# Patient Record
Sex: Male | Born: 1935 | Race: White | Hispanic: No | Marital: Married | Smoking: Never smoker
Health system: Southern US, Community
[De-identification: ages and names within clinical notes are randomized; demographics above are authoritative.]

## PROBLEM LIST (undated history)

## (undated) DIAGNOSIS — Z5189 Encounter for other specified aftercare: Secondary | ICD-10-CM

## (undated) DIAGNOSIS — C649 Malignant neoplasm of unspecified kidney, except renal pelvis: Secondary | ICD-10-CM

## (undated) DIAGNOSIS — N183 Chronic kidney disease, stage 3 unspecified: Secondary | ICD-10-CM

## (undated) DIAGNOSIS — T7840XA Allergy, unspecified, initial encounter: Secondary | ICD-10-CM

## (undated) DIAGNOSIS — J302 Other seasonal allergic rhinitis: Secondary | ICD-10-CM

## (undated) DIAGNOSIS — R000 Tachycardia, unspecified: Secondary | ICD-10-CM

## (undated) DIAGNOSIS — IMO0002 Reserved for concepts with insufficient information to code with codable children: Secondary | ICD-10-CM

## (undated) DIAGNOSIS — J189 Pneumonia, unspecified organism: Secondary | ICD-10-CM

## (undated) DIAGNOSIS — K219 Gastro-esophageal reflux disease without esophagitis: Secondary | ICD-10-CM

## (undated) DIAGNOSIS — C61 Malignant neoplasm of prostate: Secondary | ICD-10-CM

## (undated) DIAGNOSIS — M419 Scoliosis, unspecified: Secondary | ICD-10-CM

## (undated) DIAGNOSIS — M4120 Other idiopathic scoliosis, site unspecified: Secondary | ICD-10-CM

## (undated) DIAGNOSIS — E785 Hyperlipidemia, unspecified: Secondary | ICD-10-CM

## (undated) DIAGNOSIS — G510 Bell's palsy: Secondary | ICD-10-CM

## (undated) DIAGNOSIS — L570 Actinic keratosis: Secondary | ICD-10-CM

## (undated) DIAGNOSIS — Z85528 Personal history of other malignant neoplasm of kidney: Secondary | ICD-10-CM

## (undated) DIAGNOSIS — N529 Male erectile dysfunction, unspecified: Secondary | ICD-10-CM

## (undated) DIAGNOSIS — G4730 Sleep apnea, unspecified: Secondary | ICD-10-CM

## (undated) DIAGNOSIS — C801 Malignant (primary) neoplasm, unspecified: Secondary | ICD-10-CM

## (undated) DIAGNOSIS — Q600 Renal agenesis, unilateral: Secondary | ICD-10-CM

## (undated) DIAGNOSIS — H269 Unspecified cataract: Secondary | ICD-10-CM

## (undated) HISTORY — DX: Sleep apnea, unspecified: G47.30

## (undated) HISTORY — DX: Allergy, unspecified, initial encounter: T78.40XA

## (undated) HISTORY — DX: Actinic keratosis: L57.0

## (undated) HISTORY — DX: Pneumonia, unspecified organism: J18.9

## (undated) HISTORY — DX: Bell's palsy: G51.0

## (undated) HISTORY — PX: CATARACT EXTRACTION W/ INTRAOCULAR LENS IMPLANT: SHX1309

## (undated) HISTORY — DX: Hyperlipidemia, unspecified: E78.5

## (undated) HISTORY — DX: Encounter for other specified aftercare: Z51.89

## (undated) HISTORY — DX: Scoliosis, unspecified: M41.9

## (undated) HISTORY — DX: Unspecified cataract: H26.9

## (undated) HISTORY — DX: Malignant (primary) neoplasm, unspecified: C80.1

## (undated) HISTORY — DX: Malignant neoplasm of unspecified kidney, except renal pelvis: C64.9

---
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
## 1985-10-01 HISTORY — PX: NEPHRECTOMY: SHX65

## 1991-12-02 HISTORY — PX: LAMINECTOMY: SHX219

## 1998-01-01 ENCOUNTER — Encounter: Payer: Self-pay | Admitting: Family Medicine

## 1998-01-01 LAB — CONVERTED CEMR LAB: PSA: 2.5 ng/mL

## 1998-07-01 DIAGNOSIS — G4730 Sleep apnea, unspecified: Secondary | ICD-10-CM

## 1998-07-01 HISTORY — DX: Sleep apnea, unspecified: G47.30

## 1998-07-02 ENCOUNTER — Ambulatory Visit: Admission: RE | Admit: 1998-07-02 | Discharge: 1998-07-02 | Payer: Self-pay | Admitting: Internal Medicine

## 1998-12-01 HISTORY — PX: SHOULDER OPEN ROTATOR CUFF REPAIR: SHX2407

## 1999-06-01 ENCOUNTER — Encounter: Payer: Self-pay | Admitting: Family Medicine

## 1999-06-01 LAB — CONVERTED CEMR LAB: PSA: 3 ng/mL

## 2001-08-04 ENCOUNTER — Encounter: Payer: Self-pay | Admitting: Specialist

## 2001-08-04 ENCOUNTER — Ambulatory Visit (HOSPITAL_COMMUNITY): Admission: RE | Admit: 2001-08-04 | Discharge: 2001-08-04 | Payer: Self-pay | Admitting: Specialist

## 2001-08-25 ENCOUNTER — Encounter: Payer: Self-pay | Admitting: Specialist

## 2001-08-31 HISTORY — PX: SHOULDER OPEN ROTATOR CUFF REPAIR: SHX2407

## 2001-09-03 ENCOUNTER — Inpatient Hospital Stay (HOSPITAL_COMMUNITY): Admission: RE | Admit: 2001-09-03 | Discharge: 2001-09-05 | Payer: Self-pay | Admitting: Specialist

## 2002-03-01 ENCOUNTER — Encounter: Payer: Self-pay | Admitting: Family Medicine

## 2002-03-01 LAB — CONVERTED CEMR LAB: PSA: 1.7 ng/mL

## 2003-01-01 HISTORY — PX: FRACTURE SURGERY: SHX138

## 2004-07-01 ENCOUNTER — Encounter: Payer: Self-pay | Admitting: Family Medicine

## 2004-07-01 LAB — CONVERTED CEMR LAB: PSA: 1.5 ng/mL

## 2004-10-18 ENCOUNTER — Ambulatory Visit: Payer: Self-pay | Admitting: Family Medicine

## 2004-11-22 ENCOUNTER — Ambulatory Visit: Payer: Self-pay | Admitting: Family Medicine

## 2005-01-03 ENCOUNTER — Ambulatory Visit: Payer: Self-pay | Admitting: Family Medicine

## 2005-01-10 ENCOUNTER — Ambulatory Visit: Payer: Self-pay | Admitting: Family Medicine

## 2005-08-01 ENCOUNTER — Ambulatory Visit: Payer: Self-pay | Admitting: Family Medicine

## 2005-08-01 LAB — CONVERTED CEMR LAB: PSA: 1.87 ng/mL

## 2005-08-08 ENCOUNTER — Ambulatory Visit: Payer: Self-pay | Admitting: Family Medicine

## 2005-08-08 ENCOUNTER — Ambulatory Visit (HOSPITAL_COMMUNITY): Admission: RE | Admit: 2005-08-08 | Discharge: 2005-08-08 | Payer: Self-pay | Admitting: Family Medicine

## 2005-09-08 ENCOUNTER — Ambulatory Visit: Payer: Self-pay | Admitting: Family Medicine

## 2006-04-13 ENCOUNTER — Emergency Department: Payer: Self-pay | Admitting: Internal Medicine

## 2006-11-13 ENCOUNTER — Ambulatory Visit: Payer: Self-pay | Admitting: Family Medicine

## 2007-01-22 ENCOUNTER — Ambulatory Visit: Payer: Self-pay | Admitting: Family Medicine

## 2007-01-22 LAB — CONVERTED CEMR LAB
ALT: 22 units/L (ref 0–40)
AST: 23 units/L (ref 0–37)
Albumin: 4.4 g/dL (ref 3.5–5.2)
Alkaline Phosphatase: 53 units/L (ref 39–117)
BUN: 11 mg/dL (ref 6–23)
Basophils Absolute: 0 10*3/uL (ref 0.0–0.1)
Basophils Relative: 0.3 % (ref 0.0–1.0)
Bilirubin, Direct: 0.1 mg/dL (ref 0.0–0.3)
CO2: 32 meq/L (ref 19–32)
Calcium: 9.9 mg/dL (ref 8.4–10.5)
Chloride: 106 meq/L (ref 96–112)
Cholesterol: 187 mg/dL (ref 0–200)
Creatinine, Ser: 1.3 mg/dL (ref 0.4–1.5)
Eosinophils Absolute: 0.1 10*3/uL (ref 0.0–0.6)
Eosinophils Relative: 3 % (ref 0.0–5.0)
GFR calc Af Amer: 71 mL/min
GFR calc non Af Amer: 58 mL/min
Glucose, Bld: 103 mg/dL — ABNORMAL HIGH (ref 70–99)
HCT: 41.1 % (ref 39.0–52.0)
HDL: 48.8 mg/dL (ref >39.0)
Hemoglobin: 14.3 g/dL (ref 13.0–17.0)
LDL Cholesterol: 126 mg/dL — ABNORMAL HIGH (ref 0–99)
Lymphocytes Relative: 37.9 % (ref 12.0–46.0)
MCHC: 34.8 g/dL (ref 30.0–36.0)
MCV: 95 fL (ref 78.0–100.0)
Monocytes Absolute: 0.7 10*3/uL (ref 0.2–0.7)
Monocytes Relative: 16.1 % — ABNORMAL HIGH (ref 3.0–11.0)
Neutro Abs: 1.9 10*3/uL (ref 1.4–7.7)
Neutrophils Relative %: 42.7 % — ABNORMAL LOW (ref 43.0–77.0)
PSA: 1.61 ng/mL
PSA: 1.61 ng/mL (ref 0.10–4.00)
Platelets: 260 10*3/uL (ref 150–400)
Potassium: 4.1 meq/L (ref 3.5–5.1)
RBC: 4.32 M/uL (ref 4.22–5.81)
RDW: 12.8 % (ref 11.5–14.6)
Sodium: 143 meq/L (ref 135–145)
TSH: 1.82 microintl units/mL (ref 0.35–5.50)
Total Bilirubin: 1 mg/dL (ref 0.3–1.2)
Total CHOL/HDL Ratio: 3.8
Total Protein: 6.9 g/dL (ref 6.0–8.3)
Triglycerides: 62 mg/dL (ref 0–149)
VLDL: 12 mg/dL (ref 0–40)
WBC: 4.4 10*3/uL — ABNORMAL LOW (ref 4.5–10.5)

## 2007-01-29 ENCOUNTER — Ambulatory Visit: Payer: Self-pay | Admitting: Family Medicine

## 2007-02-03 ENCOUNTER — Ambulatory Visit: Payer: Self-pay | Admitting: Family Medicine

## 2007-07-22 ENCOUNTER — Encounter: Payer: Self-pay | Admitting: Family Medicine

## 2007-07-22 DIAGNOSIS — C642 Malignant neoplasm of left kidney, except renal pelvis: Secondary | ICD-10-CM

## 2007-08-06 ENCOUNTER — Ambulatory Visit: Payer: Self-pay | Admitting: Family Medicine

## 2007-09-10 ENCOUNTER — Ambulatory Visit: Payer: Self-pay | Admitting: Family Medicine

## 2007-10-14 ENCOUNTER — Ambulatory Visit: Payer: Self-pay | Admitting: Family Medicine

## 2008-02-04 ENCOUNTER — Ambulatory Visit: Payer: Self-pay | Admitting: Family Medicine

## 2008-02-04 LAB — CONVERTED CEMR LAB
AST: 23 units/L (ref 0–37)
Albumin: 4.4 g/dL (ref 3.5–5.2)
Alkaline Phosphatase: 57 units/L (ref 39–117)
Basophils Absolute: 0 10*3/uL (ref 0.0–0.1)
Basophils Relative: 0 % (ref 0.0–1.0)
CO2: 33 meq/L — ABNORMAL HIGH (ref 19–32)
Chloride: 106 meq/L (ref 96–112)
Cholesterol: 181 mg/dL (ref 0–200)
Creatinine, Ser: 1.3 mg/dL (ref 0.4–1.5)
HCT: 42.1 % (ref 39.0–52.0)
Hemoglobin: 14 g/dL (ref 13.0–17.0)
LDL Cholesterol: 124 mg/dL — ABNORMAL HIGH (ref 0–99)
MCHC: 33.2 g/dL (ref 30.0–36.0)
Neutrophils Relative %: 45.9 % (ref 43.0–77.0)
PSA: 1.68 ng/mL (ref 0.10–4.00)
RBC: 4.42 M/uL (ref 4.22–5.81)
RDW: 13.5 % (ref 11.5–14.6)
Sodium: 143 meq/L (ref 135–145)
TSH: 1.57 microintl units/mL (ref 0.35–5.50)
Total Bilirubin: 0.9 mg/dL (ref 0.3–1.2)
Total CHOL/HDL Ratio: 4
Total Protein: 7 g/dL (ref 6.0–8.3)
VLDL: 11 mg/dL (ref 0–40)
WBC: 4.2 10*3/uL — ABNORMAL LOW (ref 4.5–10.5)

## 2008-02-18 ENCOUNTER — Ambulatory Visit: Payer: Self-pay | Admitting: Family Medicine

## 2008-02-18 DIAGNOSIS — M79609 Pain in unspecified limb: Secondary | ICD-10-CM

## 2008-02-24 ENCOUNTER — Telehealth: Payer: Self-pay | Admitting: Family Medicine

## 2008-03-30 ENCOUNTER — Ambulatory Visit: Payer: Self-pay | Admitting: Family Medicine

## 2008-03-30 ENCOUNTER — Encounter (INDEPENDENT_AMBULATORY_CARE_PROVIDER_SITE_OTHER): Payer: Self-pay | Admitting: *Deleted

## 2008-03-30 LAB — CONVERTED CEMR LAB
OCCULT 2: NEGATIVE
OCCULT 3: NEGATIVE

## 2008-09-11 ENCOUNTER — Ambulatory Visit: Payer: Self-pay | Admitting: Family Medicine

## 2009-04-16 ENCOUNTER — Telehealth: Payer: Self-pay | Admitting: Family Medicine

## 2009-06-15 ENCOUNTER — Ambulatory Visit: Payer: Self-pay | Admitting: Family Medicine

## 2009-06-15 LAB — CONVERTED CEMR LAB
AST: 23 units/L (ref 0–37)
Albumin: 4.2 g/dL (ref 3.5–5.2)
Basophils Absolute: 0 10*3/uL (ref 0.0–0.1)
CO2: 28 meq/L (ref 19–32)
Chloride: 110 meq/L (ref 96–112)
Creatinine,U: 55.2 mg/dL
Eosinophils Absolute: 0.1 10*3/uL (ref 0.0–0.7)
Glucose, Bld: 101 mg/dL — ABNORMAL HIGH (ref 70–99)
HCT: 38.8 % — ABNORMAL LOW (ref 39.0–52.0)
Hemoglobin: 13.2 g/dL (ref 13.0–17.0)
Lymphs Abs: 1.5 10*3/uL (ref 0.7–4.0)
MCHC: 34.1 g/dL (ref 30.0–36.0)
MCV: 95.8 fL (ref 78.0–100.0)
Monocytes Relative: 14.5 % — ABNORMAL HIGH (ref 3.0–12.0)
Neutro Abs: 1.9 10*3/uL (ref 1.4–7.7)
Potassium: 3.8 meq/L (ref 3.5–5.1)
RDW: 13.3 % (ref 11.5–14.6)
Sodium: 141 meq/L (ref 135–145)
TSH: 1.54 microintl units/mL (ref 0.35–5.50)

## 2009-06-25 ENCOUNTER — Ambulatory Visit: Payer: Self-pay | Admitting: Family Medicine

## 2009-07-25 ENCOUNTER — Ambulatory Visit: Payer: Self-pay | Admitting: Family Medicine

## 2009-07-25 LAB — CONVERTED CEMR LAB: OCCULT 3: NEGATIVE

## 2009-08-20 ENCOUNTER — Ambulatory Visit: Payer: Self-pay | Admitting: Family Medicine

## 2009-08-20 LAB — CONVERTED CEMR LAB: OCCULT 3: NEGATIVE

## 2009-08-21 ENCOUNTER — Encounter (INDEPENDENT_AMBULATORY_CARE_PROVIDER_SITE_OTHER): Payer: Self-pay | Admitting: *Deleted

## 2010-01-31 ENCOUNTER — Telehealth: Payer: Self-pay | Admitting: Family Medicine

## 2010-05-16 ENCOUNTER — Ambulatory Visit: Payer: Self-pay | Admitting: Family Medicine

## 2010-05-16 LAB — CONVERTED CEMR LAB
ALT: 25 units/L (ref 0–53)
Albumin: 4.5 g/dL (ref 3.5–5.2)
Basophils Relative: 0.4 % (ref 0.0–3.0)
CO2: 32 meq/L (ref 19–32)
Calcium: 9.7 mg/dL (ref 8.4–10.5)
Chloride: 108 meq/L (ref 96–112)
Cholesterol: 175 mg/dL (ref 0–200)
Creatinine,U: 91.9 mg/dL
Eosinophils Absolute: 0.2 10*3/uL (ref 0.0–0.7)
HCT: 41.1 % (ref 39.0–52.0)
Hemoglobin: 14.2 g/dL (ref 13.0–17.0)
MCHC: 34.5 g/dL (ref 30.0–36.0)
MCV: 96.1 fL (ref 78.0–100.0)
Microalb Creat Ratio: 0.5 mg/g (ref 0.0–30.0)
Monocytes Absolute: 0.8 10*3/uL (ref 0.1–1.0)
Neutro Abs: 2.2 10*3/uL (ref 1.4–7.7)
Potassium: 4.7 meq/L (ref 3.5–5.1)
RBC: 4.28 M/uL (ref 4.22–5.81)
TSH: 2.07 microintl units/mL (ref 0.35–5.50)
Total Protein: 6.7 g/dL (ref 6.0–8.3)
VLDL: 18.8 mg/dL (ref 0.0–40.0)

## 2010-05-21 ENCOUNTER — Ambulatory Visit: Payer: Self-pay | Admitting: Family Medicine

## 2010-05-21 DIAGNOSIS — N529 Male erectile dysfunction, unspecified: Secondary | ICD-10-CM

## 2010-05-31 ENCOUNTER — Ambulatory Visit: Payer: Self-pay | Admitting: Family Medicine

## 2010-05-31 LAB — CONVERTED CEMR LAB: OCCULT 3: NEGATIVE

## 2010-06-04 ENCOUNTER — Encounter: Payer: Self-pay | Admitting: Family Medicine

## 2010-07-09 ENCOUNTER — Encounter (INDEPENDENT_AMBULATORY_CARE_PROVIDER_SITE_OTHER): Payer: Self-pay | Admitting: *Deleted

## 2010-08-20 ENCOUNTER — Ambulatory Visit: Payer: Self-pay | Admitting: Family Medicine

## 2010-12-29 LAB — CONVERTED CEMR LAB
ALT: 27 units/L (ref 0–53)
AST: 25 units/L (ref 0–37)
Alkaline Phosphatase: 54 units/L (ref 39–117)
Basophils Relative: 0.2 % (ref 0.0–1.0)
Bilirubin Urine: NEGATIVE
Bilirubin, Direct: 0.1 mg/dL (ref 0.0–0.3)
Blood in Urine, dipstick: NEGATIVE
Eosinophils Relative: 3.9 % (ref 0.0–5.0)
GFR calc Af Amer: 71 mL/min
GFR calc non Af Amer: 58 mL/min
Glucose, Bld: 106 mg/dL — ABNORMAL HIGH (ref 70–99)
Glucose, Urine, Semiquant: NEGATIVE
HCT: 42.4 % (ref 39.0–52.0)
MCV: 94.4 fL (ref 78.0–100.0)
Neutrophils Relative %: 47.2 % (ref 43.0–77.0)
Phosphorus: 3.6 mg/dL (ref 2.3–4.6)
Platelets: 277 10*3/uL (ref 150–400)
Potassium: 4.4 meq/L (ref 3.5–5.1)
Protein, U semiquant: NEGATIVE
RBC: 4.5 M/uL (ref 4.22–5.81)
Sodium: 140 meq/L (ref 135–145)
Specific Gravity, Urine: 1.02
Total Bilirubin: 1 mg/dL (ref 0.3–1.2)
Total Protein: 7.1 g/dL (ref 6.0–8.3)
WBC Urine, dipstick: NEGATIVE
WBC: 6.3 10*3/uL (ref 4.5–10.5)

## 2010-12-31 NOTE — Assessment & Plan Note (Signed)
Summary: FLU SHOT / LFW  Nurse Visit   Allergies: 1)  * Radiopaque Dye  Immunizations Administered:  Influenza Vaccine # 1:    Vaccine Type: Fluvax 3+    Site: left deltoid    Mfr: GlaxoSmithKline    Dose: 0.5 ml    Route: IM    Given by: Mervin Hack CMA (AAMA)    Exp. Date: 05/31/2011    Lot #: [id number]    VIS given: 06/25/10 version given August 20, 2010.  Flu Vaccine Consent Questions:    Do you have a history of severe allergic reactions to this vaccine? no    Any prior history of allergic reactions to egg and/or gelatin? no    Do you have a sensitivity to the preservative Thimersol? no    Do you have a past history of Guillan-Barre Syndrome? no    Do you currently have an acute febrile illness? no    Have you ever had a severe reaction to latex? no    Vaccine information given and explained to patient? yes  Orders Added: 1)  Flu Vaccine 60yrs + [90658] 2)  Admin 1st Vaccine [[address]]

## 2010-12-31 NOTE — Progress Notes (Signed)
Summary: needs written script for mail order  Phone Note Refill Request Call back at Home Phone [phone] Message from:  Patient  Refills Requested: Medication #1:  CRESTOR 10 MG TABS Take 1/2 by mouth at bedtime Pt needs 90 day written script for new mail order company.[facility].  Please call when ready.  Initial call taken by: Lowella Petties CMA,  January 31, 2010 12:17 PM  Follow-up for Phone Call        Left message that pt's script is ready. Follow-up by: Lowella Petties CMA,  January 31, 2010 2:33 PM    Prescriptions: CRESTOR 10 MG TABS (ROSUVASTATIN CALCIUM) Take 1/2 by mouth at bedtime  #45 x 3   Entered and Authorized by:   Shaune Leeks MD   Signed by:   Shaune Leeks MD on 01/31/2010   Method used:   Printed then faxed to ...       [facility]  [address] (retail)       [address]       [place], [place]  [address]       Ph: [phone]       Fax: [phone]   RxID:   [id number]

## 2010-12-31 NOTE — Assessment & Plan Note (Signed)
Summary: CPX/CLE   Vital Signs:  Patient profile:   75 year old male Weight:      134.50 pounds BMI:     22.46 Temp:     98.4 degrees F oral Pulse rate:   88 / minute Pulse rhythm:   regular BP sitting:   104 / 60  (left arm) Cuff size:   regular  Vitals Entered By: Sydell Axon LPN (06-20-10 1:56 PM) CC: 30 Minute checkup, hemoccult cards given to patient   History of Present Illness: Pt hee for Comp Exam. He is now retired and doing well. He has pain in the lateral side of the knee at the inserytion of the right hamstring.  He also has fasiculation and puffiness of the left eye. He had an eyew exam 4 may but wasn't doing that then.   Preventive Screening-Counseling & Management  Alcohol-Tobacco     Alcohol drinks/day: 0     Smoking Status: never     Passive Smoke Exposure: no  Caffeine-Diet-Exercise     Caffeine use/day: 2     Does Patient Exercise: yes     Type of exercise: calisthentics in the AM     Exercise (avg: min/session): 30-60     Times/week: 5  Problems Prior to Update: 1)  Special Screening Malig Neoplasms Other Sites  (ICD-V76.49) 2)  Health Maintenance Exam  (ICD-V70.0) 3)  Foot Pain, Right  (ICD-729.5) 4)  Special Screening Malignant Neoplasm of Prostate  (ICD-V76.44) 5)  Hyperglycemia (103)  (ICD-790.29) 6)  Hypercholesterolemia, 262/ldl 181  (ICD-272.0) 7)  Laminectomy, Hx of (DR. KRITZER)  (ICD-V45.89) 8)  Renal Cell Cancer, Left, (R UNILATERAL KIDNEY)  (ICD-189.0)  Medications Prior to Update: 1)  Crestor 10 Mg Tabs (Rosuvastatin Calcium) .... Take 1/2 By Mouth At Bedtime 2)  Tgt Aspirin 81 Mg Tbec (Aspirin) .... Take 1 Tablet By Mouth Every Morning 3)  Multivitamins   Tabs (Multiple Vitamin) .... Take One By Mouth Once A Day 4)  Benadryl 25 Mg  Tabs (Diphenhydramine Hcl) .... Take One By Mouth As Needed 5)  Vitamin E 6)  Calcium .... Take One By Mouth Once A Day 7)  Claritin 10 Mg  Tabs (Loratadine) .Marland Kitchen.. 1 Once Daily By Mouth As  Needed 8)  Omega-3 350 Mg Caps (Omega-3 Fatty Acids) .... Takes 1 Capsule Twice A Day By Mouth  Allergies: 1)  * Radiopaque Dye  Past History:  Past Surgical History: Last updated: 06/25/2009 Nephrectomy, Left 10/1985 Laminectomy L3-5 1993 Sleep apnea 08/99 Right shoulder rotator cuff repair 12/1998 Left shoulder rotator cuff repair 08/2001 Fractured L Wrist 01/2003  Family History: Last updated: Jun 20, 2010 Father: Died at age 19 with aortic valve repair and lung cancer secondary to prostate metastases and several mini strokes Mother: dec  65 Ca of breast with mets stroke (1980)    pacer  Sister A  70  Lupus/ fibromyalgia/arthritis and hepatitis C Sister A  95 Sister A 16 Sister A 37  Social History: Last updated: 20-Jun-2010 Marital Status: Married Children: 2 Daughters 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  Risk Factors: Alcohol Use: 0 (06/20/2010) Caffeine Use: 2 (2010/06/20) Exercise: yes (2010-06-20)  Risk Factors: Smoking Status: never (June 20, 2010) Passive Smoke Exposure: no (Jun 20, 2010)  Family History: Father: Died at age 67 with aortic valve repair and lung cancer secondary to prostate metastases and several mini strokes Mother: dec  6 Ca of breast with mets stroke (1980)    pacer  Sister A  70  Lupus/ fibromyalgia/arthritis and hepatitis C Sister A  92 Sister A 77 Sister A 90  Social History: Marital Status: Married Children: 2 Daughters 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 Eyes:  Denies blurring and eye pain; See HPI  fasiculation of lowe left eye. ENT:  Denies decreased hearing, earache, and ringing in ears. CV:  Denies chest pain or discomfort, fainting, fatigue, palpitations, shortness of  breath with exertion, swelling of feet, and swelling of hands. GI:  Denies abdominal pain, bloody stools, change in bowel habits, constipation, dark tarry stools, diarrhea, indigestion, loss of appetite, nausea, vomiting, vomiting blood, and yellowish skin color. GU:  Complains of erectile dysfunction; denies discharge, dysuria, nocturia, and urinary frequency; recent.. MS:  Complains of joint pain; denies muscle aches, cramps, and stiffness; left foot. Derm:  Denies dryness, itching, and rash. Neuro:  Denies numbness, poor balance, tingling, and tremors.  Physical Exam  General:  Well-developed,well-nourished,in no acute distress; alert,appropriate and cooperative throughout examination, thin but healthy. Head:  Normocephalic and atraumatic without obvious abnormalities. No apparent alopecia or balding. Sinuses NT. Eyes:  Conjunctiva clear bilaterally.  Ears:  External ear exam shows no significant lesions or deformities.  Otoscopic examination reveals clear canals, tympanic membranes are intact bilaterally without bulging, retraction, inflammation or discharge. Hearing is grossly normal bilaterally. Nose:  External nasal examination shows no deformity or inflammation. Nasal mucosa are pink and moist without lesions or exudates. Mouth:  Oral mucosa and oropharynx without lesions or exudates.  Teeth in good repair. Neck:  No deformities, masses, or tenderness noted. Chest Wall:  No deformities, masses, tenderness or gynecomastia noted. Breasts:  No masses or gynecomastia noted Lungs:  Normal respiratory effort, chest expands symmetrically. Lungs are clear to auscultation, no crackles or wheezes. Heart:  Normal rate and regular rhythm. S1 and S2 normal without gallop, murmur, click, rub or other extra sounds. Abdomen:  Bowel sounds positive,abdomen soft and non-tender without masses, organomegaly or hernias noted. Hernia felt ok today, had mild bulge year before last. Rectal:  No external  abnormalities noted. Normal sphincter tone. No rectal masses or tenderness. G neg. Genitalia:  Testes bilaterally descended without nodularity, tenderness or masses. No scrotal masses or lesions. No penis lesions or urethral discharge. Prostate:  Prostate gland firm and smooth, no enlargement, nodularity, tenderness, mass, asymmetry or induration. 10-20gms. Tight sphincter. Msk:  No deformity or scoliosis noted of thoracic or lumbar spine.   Pulses:  R and L carotid,radial,femoral,dorsalis pedis and posterior tibial pulses are full and equal bilaterally Extremities:  No clubbing, cyanosis, edema, or deformity noted with normal full range of motion of all joints.  Minimally tender over the right hamstring insertion laterall on the tibial plateau. Neurologic:  No cranial nerve deficits noted. Station and gait are normal.  Sensory, motor and coordinative functions appear intact. Skin:  Intact without suspicious lesions or rashes, small lipoma on posterior right upper thigh. Cervical Nodes:  No lymphadenopathy noted Inguinal Nodes:  No significant adenopathy Psych:  Cognition and judgment appear intact. Alert and cooperative with normal attention span and concentration. No apparent delusions, illusions, hallucinations   Impression & Recommendations:  Problem # 1:  HEALTH MAINTENANCE EXAM (ICD-V70.0)  Reviewed preventive care protocols, scheduled due services, and updated immunizations. Discussed Zostavax. Cont curr lifestyle.  Problem #  2:  FOOT PAIN, RIGHT (ICD-729.5) Assessment: Unchanged Mild but continues to improve, although still there.  Problem # 3:  SPECIAL SCREENING MALIGNANT NEOPLASM OF PROSTATE (ICD-V76.44) Assessment: Unchanged Stable exam and PSA.  Problem # 4:  HYPERGLYCEMIA (103) (ICD-790.29) Assessment: Improved Euglycemic this year. Microalbumin nml.  Problem # 5:  HYPERCHOLESTEROLEMIA, 262/LDL 181 (ICD-272.0) Assessment: Unchanged Good nos. Always avoid fatty food if  able. His updated medication list for this problem includes:    Crestor 10 Mg Tabs (Rosuvastatin calcium) .Marland Kitchen... Take 1/2 by mouth at bedtime  Labs Reviewed: SGOT: 23 (05/16/2010)   SGPT: 25 (05/16/2010)   HDL:39.70 (05/16/2010), 43.60 (06/15/2009)  LDL:117 (05/16/2010), 100 (06/15/2009)  Chol:175 (05/16/2010), 157 (06/15/2009)  Trig:94.0 (05/16/2010), 65.0 (06/15/2009)  Problem # 6:  ERECTILE DYSFUNCTION, ORGANIC (ICD-607.84) Assessment: New  Offered Viagra if interested. Not sure at the price I quoted him. His updated medication list for this problem includes:    Viagra 100 Mg Tabs (Sildenafil citrate) ..... One tab by mouth one hour prior  Discussed proper use of medications, as well as side effects.   Complete Medication List: 1)  Crestor 10 Mg Tabs (Rosuvastatin calcium) .... Take 1/2 by mouth at bedtime 2)  Tgt Aspirin 81 Mg Tbec (Aspirin) .... Take 1 tablet by mouth every morning 3)  Multivitamins Tabs (Multiple vitamin) .... Take one by mouth once a day 4)  Benadryl 25 Mg Tabs (Diphenhydramine hcl) .... Take one by mouth as needed 5)  Claritin 10 Mg Tabs (Loratadine) .Marland Kitchen.. 1 once daily by mouth as needed 6)  Omega-3 350 Mg Caps (Omega-3 fatty acids) .... Takes 1 capsule twice a day by mouth 7)  Vitamin E 400 Unit Caps (Vitamin e) .... Take one by mouth daily 8)  Calcium-vitamin D 500-125 Mg-unit Tabs (Calcium-vitamin d) .... Take one by mouth daily 9)  Vitamin D 1000 Unit Tabs (Cholecalciferol) .... Take one by mouth daily 10)  Viagra 100 Mg Tabs (Sildenafil citrate) .... One tab by mouth one hour prior Prescriptions: VIAGRA 100 MG TABS (SILDENAFIL CITRATE) one tab by mouth one hour prior  #10 x 12   Entered and Authorized by:   Shaune Leeks MD   Signed by:   Shaune Leeks MD on 05/21/2010   Method used:   Print then Give to Patient   RxID:   [id number]   Current Allergies (reviewed today): * RADIOPAQUE DYE

## 2010-12-31 NOTE — Letter (Signed)
Summary: Nadara Eaton letter  [place] at [facility]  [address] [place], [place] [address]   Phone: [phone]  Fax: [phone]       07/09/2010 MRN: [id number]  [facility] [address] [place], [place]  [address]  Dear Mr. MASATO, [facility] [place], and [facility] announce the retirement of Arta Silence, M.D., from full-time practice at the [facility] office effective May 30, 2010 and his plans of returning part-time.  It is important to Dr. Hetty Ely and to our practice that you understand that [facility] has seven physicians in our office for your health care needs.  We will continue to offer the same exceptional care that you have today.    Dr. Hetty Ely has spoken to many of you about his plans for retirement and returning part-time in the fall.   We will continue to work with you through the transition to schedule appointments for you in the office and meet the high standards that [facility] is committed to.   Again, it is with great pleasure that we share the news that Dr. Hetty Ely will return to [facility] at [facility] in October of 2011 with a reduced schedule.    If you have any questions, or would like to request an appointment with one of our physicians, please call us at [phone] and press the option for Scheduling an appointment.  We take pleasure in providing you with excellent patient care and look forward to seeing you at your next office visit.  Our [facility] Physicians are:  Tillman Abide, M.D. [name], M.D. Roxy Manns, M.D. Kerby Nora, M.D. Hannah Beat, M.D. Ruthe Mannan, M.D. We proudly welcomed Raechel Ache, M.D. and Eustaquio Boyden, M.D. to the practice in July/August 2011.  Sincerely,  [facility] [facility]

## 2010-12-31 NOTE — Letter (Signed)
Summary: Results Follow up Letter  [place] at [facility]  [address] [place], [place] [address]   Phone: [phone]  Fax: [phone]    06/04/2010 MRN: [id number]    SYRUS NAKAMA [address] [place], [place]  [address]    Dear Mr. DUPREE,  The following are the results of your recent test(s):  Test         Result    Pap Smear:        Normal _____  Not Normal _____ Comments: ______________________________________________________ Cholesterol: LDL(Bad cholesterol):         Your goal is less than:         HDL (Good cholesterol):       Your goal is more than: Comments:  ______________________________________________________ Mammogram:        Normal _____  Not Normal _____ Comments:  ___________________________________________________________________ Hemoccult:        Normal __X___  Not normal _______ Comments:    _____________________________________________________________________ Other Tests:    We routinely do not discuss normal results over the telephone.  If you desire a copy of the results, or you have any questions about this information we can discuss them at your next office visit.   Sincerely,    Idamae Schuller Mika Anastasi,MD  MT/ri

## 2011-04-18 NOTE — Op Note (Signed)
[facility]  Patient:    Kenneth Ross, Kenneth Ross Visit Number: [id number] MRN: [id number]          Service Type: SUR Location: 4W [id number] [id number] Attending Physician:  Pierce Crane Proc. Date: 09/03/01 Admit Date:  09/03/2001                             Operative Report  PREOPERATIVE DIAGNOSES: 1. Rotator cuff tear. 2. Acromioclavicular arthrosis, left shoulder.  POSTOPERATIVE DIAGNOSES: 1. Rotator cuff tear. 2. Acromioclavicular arthrosis, left shoulder.  PROCEDURE:  Open distal clavicle resection, acromioplasty, bursectomy, and repair of rotator cuff tendon utilizing two Mitek suture anchors.  ANESTHESIA:  General.  ASSISTANT:  Brian L. Wynona Neat, P.A.-C.  BRIEF HISTORY AND INDICATIONS:  A 75 year old male with refractory shoulder pain, MRI indicating rotator cuff tear, spurring of the acromion, and the clavicle impinging upon the rotator cuff.  Operative intervention was indicated for decompression and repair.  Risks and benefits discussed including bleeding, infection, damage to vascular structures, no change in symptoms, repeat tear, need for revision, suboptimal range of motion, etc.  TECHNIQUE:  The patient in the supine position.  After induction of an adequate general anesthesia, 1 gram of Kefzol for antimicrobial prophylaxis, the left shoulder and upper extremity was prepped and draped in the usual sterile fashion.  Incision was made over the anterior aspect of the acromion, and subcutaneous tissue was dissected.  Electrocautery was utilized to achieve hemostasis.  Marcaine 0.25% with epinephrine was infiltrated in the subcutaneous tissue.  Incision was made in the raphe between the anterior and lateral heads of the deltoid, extending up over the [facility] joint through the [facility] capsule and the deltoid trapezial fascia.  Next, the capsule was entered, and the distal clavicle was skeletonized with an AO elevator.  Large osteophytes were noted  inferiorly.  Oscillating saw utilized to remove the distal 0.5 cm of the clavicle.  Spurs were then further removed with a 3 mm Kerrison, protecting the rotator cuff at all times.  CA ligament was then detached and excised, and the deltoid subperiosteally elevated from the anterior aspect of the acromion.  An acromioplasty was performed.  Bursectomy was performed, and a large rotator cuff tear from the supraspinatus was noted, retracted, and detached approximately 2 x 1 cm.  Matt Holmes rongeur was utilized to perform a bed in the cancellous bone and the greater tuberosity with good bleeding tissue. The rotator cuff was digitally mobilized and without significant tension advanced and secured into the bony trough utilizing two Mitek suture anchors. Excellent suture was noted.  This was oversewn with #1 Vicryl interrupted figure-of-eight sutures.  Wound copiously irrigated and full range revealed no evidence of residual.  The repair had no significant tension upon the wound site.  Next, the capsule over the distal clavicle was repaired after copious irrigation and was reapproximated with #1 Vicryl interrupted figure-of-eight sutures as was the deltoid and trapezial fascia.  The raphe repaired with #1 Vicryl interrupted figure-of-eight sutures.  No significant tension on the wound was noted.  There was no evidence of active bleeding.  Subcutaneous tissue reapproximated with 2-0 Vicryl simple sutures.  The skin was reapproximated with 4-0 subcuticular PDS and reinforced with Steri-Strips and sterile dressing applied.  The patient was placed in abduction pillow, extubated without difficulty and transported to recovery room in satisfactory condition.  The patient tolerated the procedure well without complications.  POSTOPERATIVE PLAN:  Abduction pillow and passive range  of motion beginning at 3-4 weeks. Attending Physician:  Pierce Crane DD:  09/03/01 TD:  09/04/01 Job:  [id number]

## 2011-04-18 NOTE — H&P (Signed)
[facility]  Patient:    Kenneth Ross, MAFFETT Visit Number: [id number] MRN: [id number]          Service Type: Attending:  Javier Docker, M.D. Dictated by:   [name]. Mahar, P.A. Adm. Date:  09/03/01                           History and Physical  DATE OF BIRTH:  04-26-1936.  CHIEF COMPLAINT:  Left shoulder pain.  HISTORY OF PRESENT ILLNESS:  The patient is a 75 year old male with left shoulder pain times approximately one year.  He did injure this on the job. He went through some conservative management, including anti-inflammatories as well as physical therapy, and did see some progress toward the end of last year; however, he did have a repeat injury of this shoulder after this and has increased and continued pain since that point.  He has now had increased pain that has been failing conservative management and interfering with his activities of daily living and quality of life.  He did have an MRI report done that did show a full-thickness tear of the supraspinatus muscle with some retraction as well as a possible partial tear of the infraspinatus.  ALLERGIES:  IV DYE causes rash and severe nausea and vomiting.  MEDICATIONS:  Benadryl 500 mg one q.d., aspirin 81 mg one q.d., vitamin E 400 international units one q.d., a multivitamin, hydrocodone as needed, and Tylenol as needed.  He was instructed to discontinue his aspirin as well as vitamin E one week prior to surgery.  PAST MEDICAL HISTORY:  Significant for cancerous tumor of his kidney.  PAST SURGICAL HISTORY:  Significant for a left-sided nephrectomy in 1986, lumbar surgery in 1993, and right shoulder surgery in 2000.  SOCIAL HISTORY:  He denies tobacco use, denies alcohol use.  He is married and has two grown daughters.  FAMILY HISTORY:  Father is alive at age 62 with history of cerebrovascular accidents as well as ulcers and angina.  Mother is alive at age 73, also  with strokes and elevated cholesterol.  REVIEW OF SYSTEMS:  The patient denies any fever, chills, night sweats, or bleeding tendencies.  Denies blurred vision, double vision, headaches, seizure, or paralysis.  PULMONARY:  Denies shortness of breath, productive cough, or hemoptysis.  CARDIOVASCULAR:  Denies chest pain, angina, orthopnea, or claudication.  GASTROINTESTINAL:  Denies nausea, vomiting, constipation, diarrhea, melena, or bloody stool.  GENITOURINARY:  Denies dysuria, hematuria, or discharge.  MUSCULOSKELETAL:  He denies any paresthesias, numbness, or weakness to the extremities with the exception of the left shoulder pain.  PHYSICAL EXAMINATION:  VITAL SIGNS:  His blood pressure is 114/62, respirations are 16 and unlabored, pulse is 84 and regular.  GENERAL:  He is a 75 year old white male who is alert and oriented, in no acute distress.  He is well-nourished, well-groomed, pleasant, and cooperative to examination.  HEENT:  Head is normocephalic, atraumatic.  Pupils are equal, round, reactive to light.  Extraocular movement is intact.  Nares patent bilaterally.  Throat is clear without any erythema or exudate.  NECK:  Soft and supple to palpation.  No adenopathy noted.  No bruits appreciated.  CHEST:  Clear to auscultation bilaterally.  No rales, rhonchi, wheezes, stridor, or friction rubs appreciated.  BREASTS:  Not pertinent and not performed.  CARDIAC:  Regular S1, S2.  No murmurs, gallops, or rubs appreciated.  ABDOMEN:  Soft and supple to palpation.  Positive  bowel sounds throughout. Nontender, nondistended, no organomegaly noted.  GENITOURINARY:  Not pertinent and not performed.  EXTREMITIES:  See notes for specific details, but there is positive impingement sign.  He has tender AC joint, decreased range of motion secondary to pain.  Pulses are intact radially and equal bilaterally.  SKIN:  Intact without any lesions or rashes.  DIAGNOSTIC EVALUATION:   MRI report as already noted.  IMPRESSION: 1. Left-sided rotator cuff tear. 2. History of left nephrectomy in 1986.  PLAN:  Admit to [facility] on September 03, 2001, for a left shoulder rotator cuff repair by Dr. [name].  Patients primary care physician is Dr. [name] from [place] at [facility]. Dictated by:   [name]. Mahar, P.A. Attending:  Javier Docker, M.D. DD:  08/25/01 TD:  08/25/01 Job: [id number]

## 2011-07-09 ENCOUNTER — Encounter: Payer: Self-pay | Admitting: Family Medicine

## 2011-07-10 ENCOUNTER — Other Ambulatory Visit: Payer: Self-pay | Admitting: Family Medicine

## 2011-07-10 DIAGNOSIS — C649 Malignant neoplasm of unspecified kidney, except renal pelvis: Secondary | ICD-10-CM

## 2011-07-10 DIAGNOSIS — R739 Hyperglycemia, unspecified: Secondary | ICD-10-CM

## 2011-07-10 DIAGNOSIS — E7800 Pure hypercholesterolemia, unspecified: Secondary | ICD-10-CM

## 2011-07-10 DIAGNOSIS — Z125 Encounter for screening for malignant neoplasm of prostate: Secondary | ICD-10-CM

## 2011-07-18 ENCOUNTER — Other Ambulatory Visit (INDEPENDENT_AMBULATORY_CARE_PROVIDER_SITE_OTHER): Payer: Medicare Other | Admitting: Family Medicine

## 2011-07-18 DIAGNOSIS — R7309 Other abnormal glucose: Secondary | ICD-10-CM

## 2011-07-18 DIAGNOSIS — R739 Hyperglycemia, unspecified: Secondary | ICD-10-CM

## 2011-07-18 DIAGNOSIS — E7800 Pure hypercholesterolemia, unspecified: Secondary | ICD-10-CM

## 2011-07-18 DIAGNOSIS — Z125 Encounter for screening for malignant neoplasm of prostate: Secondary | ICD-10-CM

## 2011-07-18 DIAGNOSIS — C649 Malignant neoplasm of unspecified kidney, except renal pelvis: Secondary | ICD-10-CM

## 2011-07-18 LAB — CBC WITH DIFFERENTIAL/PLATELET
Basophils Relative: 0.7 % (ref 0.0–3.0)
Eosinophils Absolute: 0.2 10*3/uL (ref 0.0–0.7)
Eosinophils Relative: 3.4 % (ref 0.0–5.0)
Hemoglobin: 14.3 g/dL (ref 13.0–17.0)
MCHC: 33.4 g/dL (ref 30.0–36.0)
MCV: 97.3 fl (ref 78.0–100.0)
Monocytes Absolute: 0.8 10*3/uL (ref 0.1–1.0)
Neutro Abs: 2.7 10*3/uL (ref 1.4–7.7)
RBC: 4.41 Mil/uL (ref 4.22–5.81)
WBC: 5.7 10*3/uL (ref 4.5–10.5)

## 2011-07-18 LAB — RENAL FUNCTION PANEL
Albumin: 4.5 g/dL (ref 3.5–5.2)
CO2: 29 mEq/L (ref 19–32)
Calcium: 9.9 mg/dL (ref 8.4–10.5)
Chloride: 106 mEq/L (ref 96–112)
Glucose, Bld: 94 mg/dL (ref 70–99)
Potassium: 4.3 mEq/L (ref 3.5–5.1)
Sodium: 143 mEq/L (ref 135–145)

## 2011-07-18 LAB — PSA: PSA: 2.28 ng/mL (ref 0.10–4.00)

## 2011-07-18 LAB — HEPATIC FUNCTION PANEL
ALT: 18 U/L (ref 0–53)
AST: 20 U/L (ref 0–37)
Alkaline Phosphatase: 64 U/L (ref 39–117)
Bilirubin, Direct: 0.1 mg/dL (ref 0.0–0.3)
Total Bilirubin: 0.9 mg/dL (ref 0.3–1.2)

## 2011-07-18 LAB — LIPID PANEL
Cholesterol: 223 mg/dL — ABNORMAL HIGH (ref 0–200)
Total CHOL/HDL Ratio: 6
VLDL: 22 mg/dL (ref 0.0–40.0)

## 2011-07-18 LAB — MICROALBUMIN / CREATININE URINE RATIO: Microalb Creat Ratio: 0.3 mg/g (ref 0.0–30.0)

## 2011-07-18 LAB — TSH: TSH: 2.15 u[IU]/mL (ref 0.35–5.50)

## 2011-07-24 ENCOUNTER — Encounter: Payer: Self-pay | Admitting: Family Medicine

## 2011-07-24 ENCOUNTER — Ambulatory Visit (INDEPENDENT_AMBULATORY_CARE_PROVIDER_SITE_OTHER): Payer: Medicare Other | Admitting: Family Medicine

## 2011-07-24 VITALS — BP 112/72 | HR 80 | Temp 98.2°F | Ht 65.0 in | Wt 135.5 lb

## 2011-07-24 DIAGNOSIS — C649 Malignant neoplasm of unspecified kidney, except renal pelvis: Secondary | ICD-10-CM

## 2011-07-24 DIAGNOSIS — Z0000 Encounter for general adult medical examination without abnormal findings: Secondary | ICD-10-CM

## 2011-07-24 DIAGNOSIS — N289 Disorder of kidney and ureter, unspecified: Secondary | ICD-10-CM | POA: Insufficient documentation

## 2011-07-24 DIAGNOSIS — N529 Male erectile dysfunction, unspecified: Secondary | ICD-10-CM

## 2011-07-24 DIAGNOSIS — E7800 Pure hypercholesterolemia, unspecified: Secondary | ICD-10-CM | POA: Insufficient documentation

## 2011-07-24 MED ORDER — SIMVASTATIN 40 MG PO TABS: 40.0000 mg | ORAL_TABLET | Freq: Every evening | ORAL | Status: DC

## 2011-07-24 MED ORDER — SILDENAFIL CITRATE 100 MG PO TABS: 100.0000 mg | ORAL_TABLET | ORAL | Status: DC | PRN

## 2011-07-24 NOTE — Assessment & Plan Note (Signed)
Wants to try Viagra again. Script written.

## 2011-07-24 NOTE — Assessment & Plan Note (Signed)
No recent evidence of recurrence.

## 2011-07-24 NOTE — Patient Instructions (Addendum)
Refer for colonoscopy. SGOT, SGPT 272.4    6 WEEKS See me in 3 mos, SGOT, SGPT, CHOL PROFILE  272.4  BUN/ Cr  189.0  prior

## 2011-07-24 NOTE — Assessment & Plan Note (Signed)
LDL too high. Will start on Simva 40 to get as low as poss. Lab Results  Component Value Date   CHOL 223* 07/18/2011   CHOL 175 05/16/2010   CHOL 157 06/15/2009   Lab Results  Component Value Date   HDL 40.40 07/18/2011   HDL 96.04 05/16/2010   HDL 54.09 06/15/2009   Lab Results  Component Value Date   LDLCALC 117* 05/16/2010   LDLCALC 100* 06/15/2009   LDLCALC 124* 02/04/2008   Lab Results  Component Value Date   TRIG 110.0 07/18/2011   TRIG 94.0 05/16/2010   TRIG 65.0 06/15/2009   Lab Results  Component Value Date   CHOLHDL 6 07/18/2011   CHOLHDL 4 05/16/2010   CHOLHDL 4 06/15/2009   Lab Results  Component Value Date   LDLDIRECT 169.1 07/18/2011

## 2011-07-24 NOTE — Assessment & Plan Note (Addendum)
Creatinine drifting up. Discussed fluids. His BP has been marvelous and his sugar is nml. May need Nephrology referral soon. Avoid NSAIDs.

## 2011-07-24 NOTE — Progress Notes (Signed)
  Subjective:    Patient ID: Kenneth Ross, male    DOB: 1936-06-24, 75 y.o.   MRN: [id number]  HPI Pt here for usual yearly Comp Exam. Healthy and not seen since last PE. His only complaint is mild ringing in his ears. Is like bugs in his ears all the time.     Review of Systems  Constitutional: Negative for fever, chills, diaphoresis, appetite change, fatigue and unexpected weight change.  HENT: Positive for hearing loss (mild loss but not changing his abilities. ). Negative for ear pain, tinnitus and ear discharge.   Eyes: Positive for itching and visual disturbance (mild decrease, has cataracts.). Negative for pain and discharge.  Respiratory: Negative for cough, shortness of breath and wheezing.   Cardiovascular: Negative for chest pain and palpitations.       No SOB w/ exertion  Gastrointestinal: Positive for abdominal pain (mild RUQ abd pain with eating.). Negative for nausea, vomiting, diarrhea, constipation and blood in stool.       No heartburn or swallowing problems.  Genitourinary: Negative for dysuria, frequency and difficulty urinating.       No nocturia  Musculoskeletal: Positive for arthralgias (had left thigh discomfort a few months ago. ). Negative for myalgias and back pain.  Skin: Negative for rash.       No itching or dryness.  Neurological: Negative for tremors and numbness.       No tingling or balance problems.  Hematological: Negative for adenopathy. Does not bruise/bleed easily.  Psychiatric/Behavioral: Negative for dysphoric mood and agitation.       Objective:   Physical Exam  Constitutional: He is oriented to person, place, and time. He appears well-developed and well-nourished. No distress.  HENT:  Head: Normocephalic and atraumatic.  Right Ear: External ear normal.  Left Ear: External ear normal.  Nose: Nose normal.  Mouth/Throat: Oropharynx is clear and moist.  Eyes: Conjunctivae and EOM are normal. Pupils are equal, round, and reactive to  light. Right eye exhibits no discharge. Left eye exhibits no discharge. No scleral icterus.  Neck: Normal range of motion. Neck supple. No thyromegaly present.  Cardiovascular: Normal rate, regular rhythm, normal heart sounds and intact distal pulses.   No murmur heard. Pulmonary/Chest: Effort normal and breath sounds normal. No respiratory distress. He has no wheezes.  Abdominal: Soft. Bowel sounds are normal. He exhibits no distension and no mass. There is no tenderness. There is no rebound and no guarding.  Genitourinary: Penis normal.       No hernias felt.  Musculoskeletal: Normal range of motion. He exhibits no edema.  Lymphadenopathy:    He has no cervical adenopathy.  Neurological: He is alert and oriented to person, place, and time. Coordination normal.  Skin: Skin is warm and dry. No rash noted. He is not diaphoretic.  Psychiatric: He has a normal mood and affect. His behavior is normal. Judgment and thought content normal.          Assessment & Plan:  HMPE  I have personally reviewed the Medicare Annual Wellness questionnaire and have noted 1. The patient's medical and social history 2. Their use of alcohol, tobacco or illicit drugs 3. Their current medications and supplements 4. The patient's functional ability including ADL's, fall risks, home safety risks and hearing or visual             impairment. 5. Diet and physical activities 6. Evidence for depression or mood disorders

## 2011-08-25 ENCOUNTER — Ambulatory Visit (AMBULATORY_SURGERY_CENTER): Payer: Medicare Other | Admitting: *Deleted

## 2011-08-25 VITALS — Ht 66.0 in | Wt 136.0 lb

## 2011-08-25 DIAGNOSIS — Z1211 Encounter for screening for malignant neoplasm of colon: Secondary | ICD-10-CM

## 2011-08-25 MED ORDER — PEG-KCL-NACL-NASULF-NA ASC-C 100 G PO SOLR: ORAL | Status: DC

## 2011-09-02 ENCOUNTER — Other Ambulatory Visit: Payer: Self-pay | Admitting: Family Medicine

## 2011-09-02 DIAGNOSIS — E7800 Pure hypercholesterolemia, unspecified: Secondary | ICD-10-CM

## 2011-09-04 ENCOUNTER — Other Ambulatory Visit (INDEPENDENT_AMBULATORY_CARE_PROVIDER_SITE_OTHER): Payer: Medicare Other

## 2011-09-04 ENCOUNTER — Ambulatory Visit (INDEPENDENT_AMBULATORY_CARE_PROVIDER_SITE_OTHER): Payer: Medicare Other

## 2011-09-04 DIAGNOSIS — E7800 Pure hypercholesterolemia, unspecified: Secondary | ICD-10-CM

## 2011-09-04 DIAGNOSIS — Z23 Encounter for immunization: Secondary | ICD-10-CM

## 2011-09-04 LAB — AST: AST: 19 U/L (ref 0–37)

## 2011-09-08 ENCOUNTER — Encounter: Payer: Self-pay | Admitting: Gastroenterology

## 2011-09-08 ENCOUNTER — Ambulatory Visit (AMBULATORY_SURGERY_CENTER): Payer: Medicare Other | Admitting: Gastroenterology

## 2011-09-08 VITALS — BP 109/70 | HR 94 | Temp 97.1°F | Resp 20 | Ht 66.0 in | Wt 136.0 lb

## 2011-09-08 DIAGNOSIS — Z1211 Encounter for screening for malignant neoplasm of colon: Secondary | ICD-10-CM

## 2011-09-08 MED ORDER — SODIUM CHLORIDE 0.9 % IV SOLN: 500.0000 mL | INTRAVENOUS | Status: DC

## 2011-09-08 NOTE — Patient Instructions (Signed)
FOLLOW DISCHARGE INSTRUCTIONS (BLUE & GREEN SHEETS).   Information on diverticulosis and high fiber diet given to you .

## 2011-09-09 ENCOUNTER — Telehealth: Payer: Self-pay | Admitting: *Deleted

## 2011-09-09 NOTE — Telephone Encounter (Signed)

## 2011-10-21 ENCOUNTER — Other Ambulatory Visit: Payer: Self-pay | Admitting: Family Medicine

## 2011-10-21 DIAGNOSIS — E7800 Pure hypercholesterolemia, unspecified: Secondary | ICD-10-CM

## 2011-10-27 ENCOUNTER — Other Ambulatory Visit (INDEPENDENT_AMBULATORY_CARE_PROVIDER_SITE_OTHER): Payer: Medicare Other

## 2011-10-27 DIAGNOSIS — E7800 Pure hypercholesterolemia, unspecified: Secondary | ICD-10-CM

## 2011-10-27 LAB — LIPID PANEL
Cholesterol: 163 mg/dL (ref 0–200)
LDL Cholesterol: 99 mg/dL (ref 0–99)
VLDL: 18.2 mg/dL (ref 0.0–40.0)

## 2011-10-27 LAB — ALT: ALT: 25 U/L (ref 0–53)

## 2011-10-27 LAB — AST: AST: 22 U/L (ref 0–37)

## 2011-10-30 ENCOUNTER — Encounter: Payer: Self-pay | Admitting: Family Medicine

## 2011-10-30 ENCOUNTER — Ambulatory Visit (INDEPENDENT_AMBULATORY_CARE_PROVIDER_SITE_OTHER): Payer: Medicare Other | Admitting: Family Medicine

## 2011-10-30 DIAGNOSIS — E7800 Pure hypercholesterolemia, unspecified: Secondary | ICD-10-CM

## 2011-10-30 DIAGNOSIS — N289 Disorder of kidney and ureter, unspecified: Secondary | ICD-10-CM

## 2011-10-30 DIAGNOSIS — N529 Male erectile dysfunction, unspecified: Secondary | ICD-10-CM

## 2011-10-30 NOTE — Assessment & Plan Note (Signed)
Send in paperwork and I will fill out application for vacuum device.

## 2011-10-30 NOTE — Assessment & Plan Note (Signed)
Great response. Nos now good. Cont Simva 40. Lab Results  Component Value Date   CHOL 163 10/27/2011   HDL 45.60 10/27/2011   LDLCALC 99 10/27/2011   LDLDIRECT 169.1 07/18/2011   TRIG 91.0 10/27/2011   CHOLHDL 4 10/27/2011

## 2011-10-30 NOTE — Progress Notes (Signed)
  Subjective:    Patient ID: Kenneth Ross, male    DOB: November 04, 1936, 75 y.o.   MRN: [id number]  HPI Pt here for 3 month follow up. We started Simva 40 last time for LDL of 169. He is tolerating the medication well. Viagra helped but last time used gave him a headache. He is interested in a vacuum pump. He has handouts from magazines.    Review of SystemsNoncontributory except as above.       Objective:   Physical Exam  Constitutional: He appears well-developed and well-nourished. No distress.  HENT:  Head: Normocephalic and atraumatic.  Right Ear: External ear normal.  Left Ear: External ear normal.  Nose: Nose normal.  Mouth/Throat: Oropharynx is clear and moist.  Eyes: Conjunctivae and EOM are normal. Pupils are equal, round, and reactive to light. Right eye exhibits no discharge. Left eye exhibits no discharge.  Neck: Normal range of motion. Neck supple.  Cardiovascular: Normal rate and regular rhythm.   Pulmonary/Chest: Effort normal and breath sounds normal. He has no wheezes.  Lymphadenopathy:    He has no cervical adenopathy.  Skin: He is not diaphoretic.          Assessment & Plan:

## 2011-10-30 NOTE — Patient Instructions (Addendum)
RTC 8/13 for Comp Exam, labs prior with Dr Para March. Send in application for erection device. I will do the paperwork.

## 2011-10-30 NOTE — Assessment & Plan Note (Signed)
Will need recheck next time.

## 2012-06-30 ENCOUNTER — Other Ambulatory Visit: Payer: Self-pay | Admitting: Family Medicine

## 2012-06-30 DIAGNOSIS — E7800 Pure hypercholesterolemia, unspecified: Secondary | ICD-10-CM

## 2012-06-30 DIAGNOSIS — Z125 Encounter for screening for malignant neoplasm of prostate: Secondary | ICD-10-CM

## 2012-07-05 ENCOUNTER — Other Ambulatory Visit: Payer: Medicare Other

## 2012-07-12 ENCOUNTER — Encounter: Payer: Medicare Other | Admitting: Family Medicine

## 2012-07-15 ENCOUNTER — Other Ambulatory Visit (INDEPENDENT_AMBULATORY_CARE_PROVIDER_SITE_OTHER): Payer: Medicare Other

## 2012-07-15 DIAGNOSIS — E7800 Pure hypercholesterolemia, unspecified: Secondary | ICD-10-CM

## 2012-07-15 DIAGNOSIS — Z125 Encounter for screening for malignant neoplasm of prostate: Secondary | ICD-10-CM

## 2012-07-15 LAB — COMPREHENSIVE METABOLIC PANEL
ALT: 24 U/L (ref 0–53)
CO2: 30 mEq/L (ref 19–32)
Calcium: 9.7 mg/dL (ref 8.4–10.5)
Chloride: 104 mEq/L (ref 96–112)
GFR: 57.41 mL/min — ABNORMAL LOW (ref >60.00)
Glucose, Bld: 85 mg/dL (ref 70–99)
Sodium: 142 mEq/L (ref 135–145)
Total Protein: 6.5 g/dL (ref 6.0–8.3)

## 2012-07-15 LAB — LIPID PANEL
Cholesterol: 151 mg/dL (ref 0–200)
HDL: 35.9 mg/dL — ABNORMAL LOW (ref >39.00)

## 2012-07-15 LAB — PSA, MEDICARE: PSA: 2 ng/ml (ref 0.10–4.00)

## 2012-07-26 ENCOUNTER — Other Ambulatory Visit: Payer: Self-pay | Admitting: *Deleted

## 2012-07-26 ENCOUNTER — Encounter: Payer: Self-pay | Admitting: Family Medicine

## 2012-07-26 ENCOUNTER — Ambulatory Visit (INDEPENDENT_AMBULATORY_CARE_PROVIDER_SITE_OTHER): Payer: Medicare Other | Admitting: Family Medicine

## 2012-07-26 VITALS — BP 112/62 | HR 88 | Temp 97.5°F | Ht 66.0 in | Wt 137.8 lb

## 2012-07-26 DIAGNOSIS — E7800 Pure hypercholesterolemia, unspecified: Secondary | ICD-10-CM

## 2012-07-26 DIAGNOSIS — Z0000 Encounter for general adult medical examination without abnormal findings: Secondary | ICD-10-CM

## 2012-07-26 DIAGNOSIS — N289 Disorder of kidney and ureter, unspecified: Secondary | ICD-10-CM

## 2012-07-26 MED ORDER — SIMVASTATIN 40 MG PO TABS: 40.0000 mg | ORAL_TABLET | Freq: Every evening | ORAL | Status: DC

## 2012-07-26 MED ORDER — SILDENAFIL CITRATE 100 MG PO TABS: 50.0000 mg | ORAL_TABLET | ORAL | Status: DC | PRN

## 2012-07-26 NOTE — Patient Instructions (Addendum)
I would get a flu shot each fall.   Check with your insurance to see if they will cover the shingles shot. Take care; recheck next year.

## 2012-07-26 NOTE — Assessment & Plan Note (Signed)
Cr stable, solitary kidney.

## 2012-07-26 NOTE — Assessment & Plan Note (Signed)
Controlled, continue current meds. Labs d/w pt.  

## 2012-07-26 NOTE — Assessment & Plan Note (Signed)
See scanned forms. Routine anticipatory guidance given to patient. See health maintenance.  Tetanus 2007  Flu yearly  Shingles encouraged  PNA 2005  Colonoscopy 2012  Prostate cancer screening- PSA wnl. +FH with his father  Advance directive d/w pt. Has a living will. Wife would be designated if he isn't competent.

## 2012-07-26 NOTE — Progress Notes (Signed)
I have personally reviewed the Medicare Annual Wellness questionnaire and have noted 1. The patient's medical and social history 2. Their use of alcohol, tobacco or illicit drugs 3. Their current medications and supplements 4. The patient's functional ability including ADL's, fall risks, home safety risks and hearing or visual             impairment. 5. Diet and physical activities 6. Evidence for depression or mood disorders  The patients weight, height, BMI have been recorded in the chart and visual acuity is per eye clinic.  I have made referrals, counseling and provided education to the patient based review of the above and I have provided the pt with a written personalized care plan for preventive services.  See scanned forms.  Routine anticipatory guidance given to patient.  See health maintenance. Tetanus 2007 Flu yearly Shingles encouraged PNA 2005 Colonoscopy 2012 Prostate cancer screening- PSA wnl.  +FH with his father Advance directive d/w pt.  Has a living will.  Wife would be designated if he isn't competent.   Elevated Cholesterol: Using medications without problems: yes Muscle aches: no Diet compliance:yes Exercise:yes  Also caring for disabled daughter, she lives with patient.   PMH and SH reviewed  Meds, vitals, and allergies reviewed.   ROS: See HPI.  Otherwise negative.    GEN: nad, alert and oriented HEENT: mucous membranes moist NECK: supple w/o LA CV: rrr. PULM: ctab, no inc wob ABD: soft, +bs EXT: no edema SKIN: no acute rash

## 2013-07-18 ENCOUNTER — Encounter: Payer: Self-pay | Admitting: Family Medicine

## 2013-07-22 ENCOUNTER — Other Ambulatory Visit: Payer: Self-pay | Admitting: Family Medicine

## 2013-07-22 DIAGNOSIS — E7800 Pure hypercholesterolemia, unspecified: Secondary | ICD-10-CM

## 2013-07-22 DIAGNOSIS — Z125 Encounter for screening for malignant neoplasm of prostate: Secondary | ICD-10-CM

## 2013-07-25 ENCOUNTER — Other Ambulatory Visit (INDEPENDENT_AMBULATORY_CARE_PROVIDER_SITE_OTHER): Payer: Medicare Other

## 2013-07-25 DIAGNOSIS — Z125 Encounter for screening for malignant neoplasm of prostate: Secondary | ICD-10-CM

## 2013-07-25 DIAGNOSIS — E7800 Pure hypercholesterolemia, unspecified: Secondary | ICD-10-CM

## 2013-07-25 LAB — LIPID PANEL
HDL: 34.5 mg/dL — ABNORMAL LOW (ref >39.00)
LDL Cholesterol: 88 mg/dL (ref 0–99)
VLDL: 28.8 mg/dL (ref 0.0–40.0)

## 2013-07-25 LAB — COMPREHENSIVE METABOLIC PANEL
ALT: 23 U/L (ref 0–53)
Alkaline Phosphatase: 57 U/L (ref 39–117)
Creatinine, Ser: 1.4 mg/dL (ref 0.4–1.5)
GFR: 51.29 mL/min — ABNORMAL LOW (ref >60.00)
Sodium: 140 mEq/L (ref 135–145)
Total Bilirubin: 0.7 mg/dL (ref 0.3–1.2)
Total Protein: 6.7 g/dL (ref 6.0–8.3)

## 2013-07-25 LAB — PSA, MEDICARE: PSA: 2.16 ng/ml (ref 0.10–4.00)

## 2013-07-28 ENCOUNTER — Ambulatory Visit (INDEPENDENT_AMBULATORY_CARE_PROVIDER_SITE_OTHER): Payer: Medicare Other | Admitting: Family Medicine

## 2013-07-28 ENCOUNTER — Encounter: Payer: Self-pay | Admitting: Family Medicine

## 2013-07-28 VITALS — BP 104/70 | HR 88 | Temp 97.7°F | Wt 138.0 lb

## 2013-07-28 DIAGNOSIS — C649 Malignant neoplasm of unspecified kidney, except renal pelvis: Secondary | ICD-10-CM

## 2013-07-28 DIAGNOSIS — E7800 Pure hypercholesterolemia, unspecified: Secondary | ICD-10-CM

## 2013-07-28 DIAGNOSIS — N529 Male erectile dysfunction, unspecified: Secondary | ICD-10-CM

## 2013-07-28 DIAGNOSIS — Z0000 Encounter for general adult medical examination without abnormal findings: Secondary | ICD-10-CM

## 2013-07-28 MED ORDER — SIMVASTATIN 40 MG PO TABS: 40.0000 mg | ORAL_TABLET | Freq: Every evening | ORAL | Status: DC

## 2013-07-28 MED ORDER — SILDENAFIL CITRATE 100 MG PO TABS: 50.0000 mg | ORAL_TABLET | ORAL | Status: DC | PRN

## 2013-07-28 NOTE — Progress Notes (Signed)
I have personally reviewed the Medicare Annual Wellness questionnaire and have noted 1. The patient's medical and social history 2. Their use of alcohol, tobacco or illicit drugs 3. Their current medications and supplements 4. The patient's functional ability including ADL's, fall risks, home safety risks and hearing or visual             impairment. 5. Diet and physical activities 6. Evidence for depression or mood disorders  The patients weight, height, BMI have been recorded in the chart and visual acuity is per eye clinic.  I have made referrals, counseling and provided education to the patient based review of the above and I have provided the pt with a written personalized care plan for preventive services.  See scanned forms.  Routine anticipatory guidance given to patient.  See health maintenance. Flu encouraged Shingles 2014 PNA 2005 Tetanus 2007 Colonoscopy 2012 Prostate cancer screening 2014 Advance directive d/w pt.  Wife designated. Started discussion about long term plan to care for disabled daughter should he become incapacitated.   Cognitive function addressed- see scanned forms- and if abnormal then additional documentation follows.   Elevated Cholesterol: Using medications without problems:yes Muscle aches: no Diet compliance:yes Exercise:yes  ED.  Treated with current med.  No ADE.  Good effect.   PMH and SH reviewed  Meds, vitals, and allergies reviewed.   ROS: See HPI.  Otherwise negative.    GEN: nad, alert and oriented HEENT: mucous membranes moist NECK: supple w/o LA CV: rrr. PULM: ctab, no inc wob ABD: soft, +bs EXT: no edema SKIN: no acute rash Scoliosis noted

## 2013-07-28 NOTE — Patient Instructions (Addendum)
I would get a flu shot each fall.   Don't change your meds.  Take care.  Glad to see you.

## 2013-07-29 NOTE — Assessment & Plan Note (Signed)
Controlled, continue current meds.  No ADE.

## 2013-07-29 NOTE — Assessment & Plan Note (Signed)
See scanned forms.  Routine anticipatory guidance given to patient.  See health maintenance. Flu encouraged Shingles 2014 PNA 2005 Tetanus 2007 Colonoscopy 2012 Prostate cancer screening 2014 Advance directive d/w pt.  Wife designated. Started discussion about long term plan to care for disabled daughter should he become incapacitated.   Cognitive function addressed- see scanned forms- and if abnormal then additional documentation follows.

## 2013-07-29 NOTE — Assessment & Plan Note (Signed)
Controlled, continue current meds.  No ADE.  Labs d/w pt.

## 2013-07-29 NOTE — Assessment & Plan Note (Signed)
Cr stable 

## 2013-12-06 ENCOUNTER — Other Ambulatory Visit: Payer: Self-pay | Admitting: *Deleted

## 2013-12-06 MED ORDER — SIMVASTATIN 40 MG PO TABS: 40.0000 mg | ORAL_TABLET | Freq: Every evening | ORAL | Status: DC

## 2014-07-21 ENCOUNTER — Other Ambulatory Visit (INDEPENDENT_AMBULATORY_CARE_PROVIDER_SITE_OTHER): Payer: Commercial Managed Care - HMO

## 2014-07-21 ENCOUNTER — Other Ambulatory Visit: Payer: Self-pay | Admitting: Family Medicine

## 2014-07-21 DIAGNOSIS — E7800 Pure hypercholesterolemia, unspecified: Secondary | ICD-10-CM

## 2014-07-21 DIAGNOSIS — Z125 Encounter for screening for malignant neoplasm of prostate: Secondary | ICD-10-CM

## 2014-07-21 NOTE — Addendum Note (Signed)
Addended by: Marchia Bond on: 07/21/2014 11:36 AM   Modules accepted: Orders

## 2014-07-22 LAB — COMPREHENSIVE METABOLIC PANEL
ALBUMIN: 4.3 g/dL (ref 3.5–5.2)
ALK PHOS: 51 U/L (ref 39–117)
ALT: 20 U/L (ref 0–53)
AST: 20 U/L (ref 0–37)
BILIRUBIN TOTAL: 0.9 mg/dL (ref 0.2–1.2)
BUN: 18 mg/dL (ref 6–23)
CO2: 30 mEq/L (ref 19–32)
Calcium: 9.7 mg/dL (ref 8.4–10.5)
Chloride: 102 mEq/L (ref 96–112)
Creatinine, Ser: 1.5 mg/dL (ref 0.4–1.5)
GFR: 50.34 mL/min — ABNORMAL LOW (ref >60.00)
Glucose, Bld: 88 mg/dL (ref 70–99)
POTASSIUM: 4.3 meq/L (ref 3.5–5.1)
Sodium: 140 mEq/L (ref 135–145)
TOTAL PROTEIN: 6.6 g/dL (ref 6.0–8.3)

## 2014-07-22 LAB — PSA, MEDICARE: PSA: 2.61 ng/mL (ref 0.10–4.00)

## 2014-07-22 LAB — LIPID PANEL
Cholesterol: 144 mg/dL (ref 0–200)
HDL: 34 mg/dL — ABNORMAL LOW (ref >39.00)
LDL CALC: 94 mg/dL (ref 0–99)
NONHDL: 110
Total CHOL/HDL Ratio: 4
Triglycerides: 80 mg/dL (ref 0.0–149.0)
VLDL: 16 mg/dL (ref 0.0–40.0)

## 2014-07-24 ENCOUNTER — Encounter: Payer: Self-pay | Admitting: *Deleted

## 2014-07-28 ENCOUNTER — Encounter: Payer: Medicare Other | Admitting: Family Medicine

## 2014-08-02 ENCOUNTER — Encounter: Payer: Self-pay | Admitting: Family Medicine

## 2014-08-02 ENCOUNTER — Ambulatory Visit (INDEPENDENT_AMBULATORY_CARE_PROVIDER_SITE_OTHER): Payer: Commercial Managed Care - HMO | Admitting: Family Medicine

## 2014-08-02 VITALS — BP 116/68 | HR 92 | Temp 98.3°F | Ht 65.0 in | Wt 136.8 lb

## 2014-08-02 DIAGNOSIS — N289 Disorder of kidney and ureter, unspecified: Secondary | ICD-10-CM

## 2014-08-02 DIAGNOSIS — Z0000 Encounter for general adult medical examination without abnormal findings: Secondary | ICD-10-CM

## 2014-08-02 DIAGNOSIS — E7800 Pure hypercholesterolemia, unspecified: Secondary | ICD-10-CM

## 2014-08-02 DIAGNOSIS — Z7189 Other specified counseling: Secondary | ICD-10-CM

## 2014-08-02 DIAGNOSIS — Z23 Encounter for immunization: Secondary | ICD-10-CM

## 2014-08-02 MED ORDER — SIMVASTATIN 40 MG PO TABS: 40.0000 mg | ORAL_TABLET | Freq: Every evening | ORAL | Status: DC

## 2014-08-02 MED ORDER — SILDENAFIL CITRATE 100 MG PO TABS: 50.0000 mg | ORAL_TABLET | ORAL | Status: DC | PRN

## 2014-08-02 NOTE — Patient Instructions (Signed)
Take care.  Glad to see you.  Recheck in 1 year.  If I can do anything, please let me know.  Call back when you need a referral to the eye clinic.

## 2014-08-02 NOTE — Progress Notes (Signed)
Pre visit review using our clinic review tool, if applicable. No additional management support is needed unless otherwise documented below in the visit note.  I have personally reviewed the Medicare Annual Wellness questionnaire and have noted 1. The patient's medical and social history 2. Their use of alcohol, tobacco or illicit drugs 3. Their current medications and supplements 4. The patient's functional ability including ADL's, fall risks, home safety risks and hearing or visual             impairment. 5. Diet and physical activities 6. Evidence for depression or mood disorders  The patients weight, height, BMI have been recorded in the chart and visual acuity is per eye clinic.  I have made referrals, counseling and provided education to the patient based review of the above and I have provided the pt with a written personalized care plan for preventive services.  Provider list updated- see scanned forms.  Routine anticipatory guidance given to patient.  See health maintenance.  Flu 2015 Shingles 2014 PNA 2005 Tetanus 2007 Colonoscopy 2012 Prostate cancer screening- PSA wnl d/w pt.  Advance directive- wife designated if patient were incapacitated.   Cognitive function addressed- see scanned forms- and if abnormal then additional documentation follows.   Elevated Cholesterol: Using medications without problems: yes Muscle aches: no Diet compliance:yes Exercise:yes Labs d/w pt. Lipids controlled.   Daughter died 21 and wife with cancer.  He is "getting by".  D/w pt.  His outlook is still good, "I'm doing what I needed to, what I have to."  I offered my condolences.    PMH and SH reviewed  Meds, vitals, and allergies reviewed.   ROS: See HPI.  Otherwise negative.    GEN: nad, alert and oriented HEENT: mucous membranes moist NECK: supple w/o LA CV: rrr. PULM: ctab, no inc wob ABD: soft, +bs EXT: no edema SKIN: no acute rash Scoliotic changes noted.

## 2014-08-03 DIAGNOSIS — Z7189 Other specified counseling: Secondary | ICD-10-CM | POA: Insufficient documentation

## 2014-08-03 NOTE — Assessment & Plan Note (Signed)
Controlled, continue as is.  Labs d/w pt.  

## 2014-08-03 NOTE — Assessment & Plan Note (Signed)
Solitary kidney, given that Cr is good/stable.  D/w pt.  Will recheck episodically.

## 2014-08-03 NOTE — Assessment & Plan Note (Signed)
Flu 2015 Shingles 2014 PNA 2005 Tetanus 2007 Colonoscopy 2012 Prostate cancer screening- PSA wnl d/w pt.  Advance directive- wife designated if patient were incapacitated.   Cognitive function addressed- see scanned forms- and if abnormal then additional documentation follows.

## 2014-09-11 ENCOUNTER — Other Ambulatory Visit: Payer: Self-pay | Admitting: Family Medicine

## 2015-07-05 ENCOUNTER — Other Ambulatory Visit: Payer: Self-pay | Admitting: Family Medicine

## 2015-07-05 NOTE — Telephone Encounter (Signed)
Electronic refill request.  Last Filled:    4 tablet 12 RF on 08/02/2014  Please advise.

## 2015-07-06 NOTE — Telephone Encounter (Signed)
Sent. Thanks.   

## 2015-07-26 ENCOUNTER — Encounter: Payer: Self-pay | Admitting: Family Medicine

## 2015-07-26 ENCOUNTER — Other Ambulatory Visit: Payer: Self-pay | Admitting: Family Medicine

## 2015-07-26 DIAGNOSIS — E7800 Pure hypercholesterolemia, unspecified: Secondary | ICD-10-CM

## 2015-07-26 DIAGNOSIS — Z8042 Family history of malignant neoplasm of prostate: Secondary | ICD-10-CM | POA: Insufficient documentation

## 2015-07-26 DIAGNOSIS — Z125 Encounter for screening for malignant neoplasm of prostate: Secondary | ICD-10-CM

## 2015-07-31 ENCOUNTER — Other Ambulatory Visit (INDEPENDENT_AMBULATORY_CARE_PROVIDER_SITE_OTHER): Payer: Medicare HMO

## 2015-07-31 ENCOUNTER — Telehealth: Payer: Self-pay | Admitting: Family Medicine

## 2015-07-31 DIAGNOSIS — E7800 Pure hypercholesterolemia, unspecified: Secondary | ICD-10-CM

## 2015-07-31 DIAGNOSIS — Z125 Encounter for screening for malignant neoplasm of prostate: Secondary | ICD-10-CM

## 2015-07-31 LAB — PSA, MEDICARE: PSA: 2.83 ng/ml (ref 0.10–4.00)

## 2015-07-31 LAB — COMPREHENSIVE METABOLIC PANEL
ALT: 21 U/L (ref 0–53)
AST: 20 U/L (ref 0–37)
Albumin: 4.5 g/dL (ref 3.5–5.2)
Alkaline Phosphatase: 62 U/L (ref 39–117)
BILIRUBIN TOTAL: 0.6 mg/dL (ref 0.2–1.2)
BUN: 15 mg/dL (ref 6–23)
CO2: 29 mEq/L (ref 19–32)
Calcium: 9.6 mg/dL (ref 8.4–10.5)
Chloride: 106 mEq/L (ref 96–112)
Creatinine, Ser: 1.36 mg/dL (ref 0.40–1.50)
GFR: 54.05 mL/min — AB (ref >60.00)
Glucose, Bld: 95 mg/dL (ref 70–99)
Potassium: 4.2 mEq/L (ref 3.5–5.1)
Sodium: 142 mEq/L (ref 135–145)
Total Protein: 6.9 g/dL (ref 6.0–8.3)

## 2015-07-31 LAB — LIPID PANEL
CHOL/HDL RATIO: 4
Cholesterol: 150 mg/dL (ref 0–200)
HDL: 37.4 mg/dL — AB (ref >39.00)
LDL Cholesterol: 92 mg/dL (ref 0–99)
NONHDL: 112.53
Triglycerides: 105 mg/dL (ref 0.0–149.0)
VLDL: 21 mg/dL (ref 0.0–40.0)

## 2015-07-31 NOTE — Telephone Encounter (Signed)
Patient had an appointment for a wellness exam on 08/07/15.  Patient had his lab work done today.  Patient has to reschedule his wellness exam because his wife has an appointment for a biopsy on the same day as is wellness exam.  Your next available for a wellness exam is 09/27/15.  Can patient be seen sooner for his wellness exam?

## 2015-08-01 NOTE — Telephone Encounter (Signed)
30 min appointment when possible/convenient, I wish him and his wife only the best with the scheduled biopsy.   Thanks.

## 2015-08-07 ENCOUNTER — Encounter: Payer: Medicare HMO | Admitting: Family Medicine

## 2015-08-08 ENCOUNTER — Ambulatory Visit (INDEPENDENT_AMBULATORY_CARE_PROVIDER_SITE_OTHER): Payer: Medicare HMO | Admitting: Family Medicine

## 2015-08-08 ENCOUNTER — Encounter: Payer: Self-pay | Admitting: Family Medicine

## 2015-08-08 VITALS — BP 104/64 | HR 80 | Temp 98.6°F | Ht 64.5 in | Wt 138.5 lb

## 2015-08-08 DIAGNOSIS — M4120 Other idiopathic scoliosis, site unspecified: Secondary | ICD-10-CM | POA: Insufficient documentation

## 2015-08-08 DIAGNOSIS — N289 Disorder of kidney and ureter, unspecified: Secondary | ICD-10-CM | POA: Diagnosis not present

## 2015-08-08 DIAGNOSIS — E7800 Pure hypercholesterolemia, unspecified: Secondary | ICD-10-CM

## 2015-08-08 DIAGNOSIS — Z23 Encounter for immunization: Secondary | ICD-10-CM | POA: Diagnosis not present

## 2015-08-08 DIAGNOSIS — Z0000 Encounter for general adult medical examination without abnormal findings: Secondary | ICD-10-CM | POA: Diagnosis not present

## 2015-08-08 MED ORDER — SIMVASTATIN 40 MG PO TABS: 40.0000 mg | ORAL_TABLET | Freq: Every evening | ORAL | Status: DC

## 2015-08-08 NOTE — Patient Instructions (Signed)
Take care.  Glad to see you.  Don't change your meds for now.  If you have more aches, then let me know.

## 2015-08-08 NOTE — Progress Notes (Signed)
Pre visit review using our clinic review tool, if applicable. No additional management support is needed unless otherwise documented below in the visit note.  I have personally reviewed the Medicare Annual Wellness questionnaire and have noted 1. The patient's medical and social history 2. Their use of alcohol, tobacco or illicit drugs 3. Their current medications and supplements 4. The patient's functional ability including ADL's, fall risks, home safety risks and hearing or visual             impairment. 5. Diet and physical activities 6. Evidence for depression or mood disorders  The patients weight, height, BMI have been recorded in the chart and visual acuity is per eye clinic.  I have made referrals, counseling and provided education to the patient based review of the above and I have provided the pt with a written personalized care plan for preventive services.  Provider list updated- see scanned forms.  Routine anticipatory guidance given to patient.  See health maintenance.  Flu  2016 Shingles 2014 PNA 2016 Tetanus 2007 Colonoscopy 2012 Prostate cancer screening d/w pt.  PSA at baseline, d/w pt.  Advance directive- wife designated if patient were incapacitated.  Cognitive function addressed- see scanned forms- and if abnormal then additional documentation follows.   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  He doesn't have unexpected sx.   Diet compliance:yes Exercise:yes Labs d/w pt.   H/o elevated Cr secondary to prev nephrectomy with stable Cr noted on labs, d/w pt.  Not on nsaids or renal toxic meds.   PMH and SH reviewed  Meds, vitals, and allergies reviewed.   ROS: See HPI.  Otherwise negative.    GEN: nad, alert and oriented HEENT: mucous membranes moist NECK: supple w/o LA CV: rrr. PULM: ctab, no inc wob ABD: soft, +bs EXT: no edema SKIN: no acute  rash Scoliosis noted on exam.

## 2015-08-09 NOTE — Assessment & Plan Note (Signed)
TC up some but still controlled, would continue with statin.  Current aches likely not from statin, okay to continue.  He agrees. Labs d/w pt.  Continue diet and exercise as tolerated.

## 2015-08-09 NOTE — Assessment & Plan Note (Signed)
He has tolerated this for a long time and he isn't acutely worse, d/w pt- at this point, not needing intervention.  If worsening, he'll let me know.  Continuing activity and stretching likely best tx for now.

## 2015-08-09 NOTE — Assessment & Plan Note (Signed)
Flu  2016 Shingles 2014 PNA 2016 Tetanus 2007 Colonoscopy 2012 Prostate cancer screening d/w pt.  PSA at baseline, d/w pt.  Advance directive- wife designated if patient were incapacitated.  Cognitive function addressed- see scanned forms- and if abnormal then additional documentation follows.

## 2015-08-09 NOTE — Assessment & Plan Note (Signed)
With stable creatinine, d/w pt.  Continue as is.

## 2015-09-27 ENCOUNTER — Encounter: Payer: Medicare HMO | Admitting: Family Medicine

## 2015-10-05 ENCOUNTER — Telehealth: Payer: Self-pay | Admitting: Family Medicine

## 2015-10-05 ENCOUNTER — Encounter: Payer: Self-pay | Admitting: Internal Medicine

## 2015-10-05 ENCOUNTER — Other Ambulatory Visit: Payer: Self-pay | Admitting: Internal Medicine

## 2015-10-05 ENCOUNTER — Ambulatory Visit (INDEPENDENT_AMBULATORY_CARE_PROVIDER_SITE_OTHER): Payer: Medicare HMO | Admitting: Internal Medicine

## 2015-10-05 VITALS — BP 122/75 | HR 95 | Temp 98.2°F | Wt 142.4 lb

## 2015-10-05 DIAGNOSIS — G510 Bell's palsy: Secondary | ICD-10-CM | POA: Insufficient documentation

## 2015-10-05 MED ORDER — FAMCICLOVIR 500 MG PO TABS: 1000.0000 mg | ORAL_TABLET | Freq: Three times a day (TID) | ORAL | Status: DC

## 2015-10-05 MED ORDER — PREDNISONE 20 MG PO TABS: 60.0000 mg | ORAL_TABLET | Freq: Every day | ORAL | Status: DC

## 2015-10-05 NOTE — Assessment & Plan Note (Signed)
Classic presentation Some facial pain for a week--- ?viral infection No history of tick bite---but will check Lyme titer and treat if positive Will treat with prednisone and valacyclovir for 1 week per current recommendations

## 2015-10-05 NOTE — Telephone Encounter (Signed)
See OV note.  

## 2015-10-05 NOTE — Addendum Note (Signed)
Addended by: Daralene Milch C on: 10/05/2015 01:20 PM   Modules accepted: Orders

## 2015-10-05 NOTE — Telephone Encounter (Signed)
Patient Name: EARNESTINE TUOHEY DOB: [birth year] Initial Comment Caller states can't control left side of face very well Nurse Assessment Nurse: Ronnald Ramp, RN, Miranda Date/Time (Eastern Time): 10/05/2015 8:27:23 AM Confirm and document reason for call. If symptomatic, describe symptoms. ---Caller states the left side of his face is not moving the same as the right this morning. Last night he had pain around his left eye. Also with pain behind his left ear and down his neck. He has had cold symptoms for 2-3 weeks. Has the patient traveled out of the country within the last 30 days? ---Not Applicable Does the patient have any new or worsening symptoms? ---Yes Will a triage be completed? ---Yes Related visit to physician within the last 2 weeks? ---No Does the PT have any chronic conditions? (i.e. diabetes, asthma, etc.) ---Yes List chronic conditions. ---High Cholesterol Guidelines Guideline Title Affirmed Question Affirmed Notes Neurologic Deficit Bell's palsy suspected (i.e., weakness on only one side of the face, developing over hours to days, no other symptoms) Final Disposition User Go to ED Now (or PCP triage) Ronnald Ramp, RN, Miranda Comments Appt scheduled at 1015 with Dr. Silvio Pate Disagree/Comply: Comply

## 2015-10-05 NOTE — Telephone Encounter (Signed)
TH scheduled appt 10/05/15 at 10:15 AM with Dr Silvio Pate.

## 2015-10-05 NOTE — Progress Notes (Signed)
Subjective:    Patient ID: Kenneth Ross, male    DOB: January 05, 1936, 79 y.o.   MRN: [id number]  HPI Here with wife--- due to facial problem  Noticed problems with left face when tried to shave today Pain in left side of face and behind ear and along maxilla--this was noted for about a week Hard to close eye--chronic trouble opening left eye completely   No trouble speaking or swallowing No focal weakness  Has had a cold Mild cough Some trouble with voice at times---since pneumonia some years ago Slight tightening in chest while lying down No SOB No fever  Current Outpatient Prescriptions on File Prior to Visit  Medication Sig Dispense Refill  . aspirin 81 MG tablet Take 81 mg by mouth daily.      . Calcium 500-125 MG-UNIT TABS Take 1 tablet by mouth daily.      . Cholecalciferol (VITAMIN D) 1000 UNITS capsule Take 1,000 Units by mouth daily.      . diphenhydrAMINE (BENADRYL) 25 MG tablet Take 25 mg by mouth as needed.      . fish oil-omega-3 fatty acids 1000 MG capsule Take 1 g by mouth daily.     Marland Kitchen loratadine (CLARITIN) 10 MG tablet Take 10 mg by mouth daily.     . Multiple Vitamin (MULTIVITAMIN) capsule Take 1 capsule by mouth daily.      . ranitidine (ZANTAC) 150 MG tablet Take 150 mg by mouth 2 (two) times daily.    . sildenafil (REVATIO) 20 MG tablet TAKE 3 TO 5 TABLETS BY MOUTH ONCE A DAY AS NEEDED 20 tablet 12  . simvastatin (ZOCOR) 40 MG tablet Take 1 tablet (40 mg total) by mouth every evening. 90 tablet 3   No current facility-administered medications on file prior to visit.    Allergies  Allergen Reactions  . Iodinated Diagnostic Agents Nausea And Vomiting    IV dye    Past Medical History  Diagnosis Date  . Sleep apnea 08/99  . Allergy     seasonal  . Hyperlipidemia   . Cancer ([place])     kidney  . Scoliosis     Past Surgical History  Procedure Laterality Date  . Nephrectomy  10/1985  . Laminectomy  1993    L3-5  . Shoulder open rotator cuff  repair  12/1998    Right  . Shoulder open rotator cuff repair  08/2001    Left  . Fracture surgery  01/2003    Left Wrist    Family History  Problem Relation Age of Onset  . Heart disease Father   . Cancer Father     lung and prostate metastases  . Stroke Father     mini strokes  . Prostate cancer Father   . Cancer Mother     breast  . Stroke Mother     mets stroke  18  . Fibromyalgia Mother   . Arthritis Mother   . Lupus Sister   . Esophageal cancer Paternal Aunt   . Colon cancer Neg Hx     Social History   Social History  . Marital Status: Married    Spouse Name: N/A  . Number of Children: N/A  . Years of Education: N/A   Occupational History  . Retired    Social History Main Topics  . Smoking status: Never Smoker   . Smokeless tobacco: Never Used  . Alcohol Use: No  . Drug Use: No  . Sexual Activity: Yes  Other Topics Concern  . Not on file   Social History Narrative   Retired from [employer], Dealer   Marital Status: Married 03-06-61   Children: 2 daughters initially- one was disabled and lived with pt until she died in 2013/03/06   [employer]   Wife with cancer as of Mar 07, 2015 (hodgkin's lymphoma)   Review of Systems  Appetite is okay No rash No history of shingles-- vague rash 8-10 years ago. Did have zostavax No known tick bites     Objective:   Physical Exam  Constitutional: He is oriented to person, place, and time. He appears well-developed and well-nourished. No distress.  HENT:  Mouth/Throat: Oropharynx is clear and moist. No oropharyngeal exudate.  TMs normal  Eyes: Conjunctivae and EOM are normal. Pupils are equal, round, and reactive to light.  Neck: Normal range of motion. Neck supple.  No occipital or auricular adenopathy  Lymphadenopathy:    He has no cervical adenopathy.  Neurological: He is alert and oriented to person, place, and time. He exhibits normal muscle tone. Coordination normal.  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 His behavior is normal.          Assessment & Plan:

## 2015-10-05 NOTE — Progress Notes (Signed)
Pre visit review using our clinic review tool, if applicable. No additional management support is needed unless otherwise documented below in the visit note. 

## 2015-10-05 NOTE — Patient Instructions (Signed)
Bell Palsy °Bell palsy is a condition in which the muscles on one side of the face become paralyzed. This often causes one side of the face to droop. It is a common condition and most people recover completely. °RISK FACTORS °Risk factors for Bell palsy include: °· Pregnancy. °· Diabetes. °· An infection by a virus, such as infections that cause cold sores. °CAUSES  °Bell palsy is caused by damage to or inflammation of a nerve in your face. It is unclear why this happens, but an infection by a virus may lead to it. Most of the time the reason it happens is unknown. °SIGNS AND SYMPTOMS  °Symptoms can range from mild to severe and can take place over a number of hours. Symptoms may include: °· Being unable to: °¨ Raise one or both eyebrows. °¨ Close one or both eyes. °¨ Feel parts of your face (facial numbness). °· Drooping of the eyelid and corner of the mouth. °· Weakness in the face. °· Paralysis of half your face. °· Loss of taste. °· Sensitivity to loud noises. °· Difficulty chewing. °· Tearing up of the affected eye. °· Dryness in the affected eye. °· Drooling. °· Pain behind one ear. °DIAGNOSIS  °Diagnosis of Bell palsy may include: °· A medical history and physical exam. °· An MRI. °· A CT scan. °· Electromyography (EMG). This is a test that checks how your nerves are working. °TREATMENT  °Treatment may include antiviral medicine to help shorten the length of the condition. Sometimes treatment is not needed and the symptoms go away on their own. °HOME CARE INSTRUCTIONS  °· Take medicines only as directed by your health care provider. °· Do facial massages and exercises as directed by your health care provider. °· If your eye is affected: °¨ Use moisturizing eye drops to prevent drying of your eye as directed by your health care provider. °¨ Protect your eye as directed by your health care provider. °SEEK MEDICAL CARE IF: °· Your symptoms do not get better or get worse. °· You are drooling. °· Your eye is red,  irritated, or hurts. °SEEK IMMEDIATE MEDICAL CARE IF:  °· Another part of your body feels weak or numb. °· You have difficulty swallowing. °· You have a fever along with symptoms of Bell palsy. °· You develop neck pain. °MAKE SURE YOU:  °· Understand these instructions. °· Will watch your condition. °· Will get help right away if you are not doing well or get worse. °  °This information is not intended to replace advice given to you by your health care provider. Make sure you discuss any questions you have with your health care provider. °  °Document Released: 11/17/2005 Document Revised: 08/08/2015 Document Reviewed: 02/24/2014 °Elsevier Interactive Patient Education ©2016 [employer] ° °

## 2015-10-08 LAB — LYME AB/WESTERN BLOT REFLEX: B burgdorferi Ab IgG+IgM: 0.77 {ISR}

## 2016-04-03 DIAGNOSIS — H2513 Age-related nuclear cataract, bilateral: Secondary | ICD-10-CM | POA: Diagnosis not present

## 2016-04-03 DIAGNOSIS — Z0100 Encounter for examination of eyes and vision without abnormal findings: Secondary | ICD-10-CM | POA: Diagnosis not present

## 2016-05-20 ENCOUNTER — Other Ambulatory Visit: Payer: Self-pay

## 2016-05-20 MED ORDER — SIMVASTATIN 40 MG PO TABS: 40.0000 mg | ORAL_TABLET | Freq: Every evening | ORAL | Status: DC

## 2016-05-20 NOTE — Telephone Encounter (Signed)
Pt [facility]pt no longer uses mail order pharmacy. Pt has cpx scheduled for 08/11/16. Advised refill done x 1 and pt voiced understanding.

## 2016-07-31 ENCOUNTER — Other Ambulatory Visit: Payer: Self-pay | Admitting: Family Medicine

## 2016-07-31 NOTE — Telephone Encounter (Signed)
Electronic refill request. Last Filled:    20 tablet 12 07/06/2015  CPX scheduled 08/11/2016   Please advise.

## 2016-07-31 NOTE — Telephone Encounter (Signed)
Sent!

## 2016-08-01 ENCOUNTER — Other Ambulatory Visit: Payer: Self-pay | Admitting: Family Medicine

## 2016-08-01 DIAGNOSIS — E7800 Pure hypercholesterolemia, unspecified: Secondary | ICD-10-CM

## 2016-08-01 DIAGNOSIS — Z125 Encounter for screening for malignant neoplasm of prostate: Secondary | ICD-10-CM

## 2016-08-06 ENCOUNTER — Other Ambulatory Visit (INDEPENDENT_AMBULATORY_CARE_PROVIDER_SITE_OTHER): Payer: PPO

## 2016-08-06 DIAGNOSIS — E7800 Pure hypercholesterolemia, unspecified: Secondary | ICD-10-CM | POA: Diagnosis not present

## 2016-08-06 DIAGNOSIS — Z125 Encounter for screening for malignant neoplasm of prostate: Secondary | ICD-10-CM

## 2016-08-06 LAB — COMPREHENSIVE METABOLIC PANEL WITH GFR
ALT: 19 U/L (ref 0–53)
AST: 19 U/L (ref 0–37)
Albumin: 4.5 g/dL (ref 3.5–5.2)
Alkaline Phosphatase: 55 U/L (ref 39–117)
BUN: 17 mg/dL (ref 6–23)
CO2: 30 meq/L (ref 19–32)
Calcium: 9.3 mg/dL (ref 8.4–10.5)
Chloride: 105 meq/L (ref 96–112)
Creatinine, Ser: 1.45 mg/dL (ref 0.40–1.50)
GFR: 50.07 mL/min — ABNORMAL LOW
Glucose, Bld: 93 mg/dL (ref 70–99)
Potassium: 4.1 meq/L (ref 3.5–5.1)
Sodium: 142 meq/L (ref 135–145)
Total Bilirubin: 0.7 mg/dL (ref 0.2–1.2)
Total Protein: 6.7 g/dL (ref 6.0–8.3)

## 2016-08-06 LAB — LIPID PANEL
CHOL/HDL RATIO: 4
CHOLESTEROL: 161 mg/dL (ref 0–200)
HDL: 36.9 mg/dL — AB (ref >39.00)
LDL Cholesterol: 94 mg/dL (ref 0–99)
NonHDL: 124.1
TRIGLYCERIDES: 149 mg/dL (ref 0.0–149.0)
VLDL: 29.8 mg/dL (ref 0.0–40.0)

## 2016-08-06 LAB — PSA, MEDICARE: PSA: 4.15 ng/mL — ABNORMAL HIGH (ref 0.10–4.00)

## 2016-08-11 ENCOUNTER — Encounter: Payer: Self-pay | Admitting: Family Medicine

## 2016-08-11 ENCOUNTER — Ambulatory Visit: Payer: PPO

## 2016-08-11 ENCOUNTER — Ambulatory Visit (INDEPENDENT_AMBULATORY_CARE_PROVIDER_SITE_OTHER): Payer: PPO | Admitting: Family Medicine

## 2016-08-11 VITALS — BP 108/62 | HR 74 | Temp 98.2°F | Ht 64.5 in | Wt 142.5 lb

## 2016-08-11 DIAGNOSIS — E7800 Pure hypercholesterolemia, unspecified: Secondary | ICD-10-CM | POA: Diagnosis not present

## 2016-08-11 DIAGNOSIS — Z23 Encounter for immunization: Secondary | ICD-10-CM

## 2016-08-11 DIAGNOSIS — Z125 Encounter for screening for malignant neoplasm of prostate: Secondary | ICD-10-CM

## 2016-08-11 DIAGNOSIS — Z0000 Encounter for general adult medical examination without abnormal findings: Secondary | ICD-10-CM

## 2016-08-11 DIAGNOSIS — R972 Elevated prostate specific antigen [PSA]: Secondary | ICD-10-CM

## 2016-08-11 MED ORDER — SIMVASTATIN 40 MG PO TABS: 40.0000 mg | ORAL_TABLET | Freq: Every evening | ORAL | 3 refills | Status: DC

## 2016-08-11 NOTE — Progress Notes (Signed)
I have personally reviewed the Medicare Annual Wellness questionnaire and have noted 1. The patient's medical and social history 2. Their use of alcohol, tobacco or illicit drugs 3. Their current medications and supplements 4. The patient's functional ability including ADL's, fall risks, home safety risks and hearing or visual             impairment. 5. Diet and physical activities 6. Evidence for depression or mood disorders  The patients weight, height, BMI have been recorded in the chart and visual acuity is per eye clinic.  I have made referrals, counseling and provided education to the patient based review of the above and I have provided the pt with a written personalized care plan for preventive services.  Provider list updated- see scanned forms.  Routine anticipatory guidance given to patient.  See health maintenance.  Flu 2017 Shingles up to date PNA up to date Tetanus 2007, d/w pt Colonoscopy 2012 Prostate cancer screening- see below Advance directive- wife designated if patient were incapacitated.  Cognitive function addressed- see scanned forms- and if abnormal then additional documentation follows.   Elevated Cholesterol: Using medications without problems:yes Muscle aches: no Diet compliance:yes Exercise:yes Labs d/w pt.    L eye still with some eyelid weakness but now can close his eye w/o troubles.    PSA elevation noted. No urinary symptoms. Family history noted. Discussed with patient about labs and options. See plan.  PMH and SH reviewed  Meds, vitals, and allergies reviewed.   ROS: Per HPI.  Unless specifically indicated otherwise in HPI, the patient denies:  General: fever. Eyes: acute vision changes ENT: sore throat Cardiovascular: chest pain Respiratory: SOB GI: vomiting GU: dysuria Musculoskeletal: acute back pain Derm: acute rash Neuro: acute motor dysfunction Psych: worsening mood Endocrine: polydipsia Heme: bleeding Allergy:  hayfever  GEN: nad, alert and oriented HEENT: mucous membranes moist NECK: supple w/o LA CV: rrr. PULM: ctab, no inc wob ABD: soft, +bs EXT: no edema SKIN: no acute rash Prostate gland firm and smooth, no enlargement, nodularity, tenderness, mass, asymmetry or induration. Scoliosis noted on exam.

## 2016-08-11 NOTE — Patient Instructions (Addendum)
Check with your insurance to see if they will cover the tetanus shot. Recheck PSA in about 2 months.  Labs visit, not fasting.  We'll go from there.  Take care.  Glad to see you.  Update me as needed.

## 2016-08-11 NOTE — Progress Notes (Signed)
Pre visit review using our clinic review tool, if applicable. No additional management support is needed unless otherwise documented below in the visit note. 

## 2016-08-12 DIAGNOSIS — R972 Elevated prostate specific antigen [PSA]: Secondary | ICD-10-CM | POA: Insufficient documentation

## 2016-08-12 NOTE — Assessment & Plan Note (Signed)
flu 2017 Shingles up to date PNA up to date Tetanus 2007, d/w pt Colonoscopy 2012 Prostate cancer screening- see below Advance directive- wife designated if patient were incapacitated.  Cognitive function addressed- see scanned forms- and if abnormal then additional documentation follows.

## 2016-08-12 NOTE — Assessment & Plan Note (Signed)
Reasonable control. Continue statin. No adverse effect of medicine. Labs discussed with patient.

## 2016-08-12 NOTE — Assessment & Plan Note (Signed)
Rectal exam within normal limits, no mass, no nodule, no asymmetry. Discussed patient about options and PSA testing in general. I see to options at this point. We can either refer to urology now or we can recheck PSA in about 2 months. He opts to recheck PSA in about 2 months. Lab ordered. He agrees with plan. He'll update me as needed in the meantime.

## 2016-10-14 ENCOUNTER — Other Ambulatory Visit (INDEPENDENT_AMBULATORY_CARE_PROVIDER_SITE_OTHER): Payer: PPO

## 2016-10-14 DIAGNOSIS — Z125 Encounter for screening for malignant neoplasm of prostate: Secondary | ICD-10-CM

## 2016-10-14 LAB — PSA, MEDICARE: PSA: 4.3 ng/mL — AB (ref 0.10–4.00)

## 2016-10-14 NOTE — Addendum Note (Signed)
Addended by: Marchia Bond on: 10/14/2016 09:46 AM   Modules accepted: Orders

## 2016-10-15 ENCOUNTER — Other Ambulatory Visit: Payer: Self-pay | Admitting: Family Medicine

## 2016-10-15 DIAGNOSIS — R972 Elevated prostate specific antigen [PSA]: Secondary | ICD-10-CM

## 2016-11-13 ENCOUNTER — Other Ambulatory Visit: Payer: Self-pay | Admitting: Family Medicine

## 2016-12-10 DIAGNOSIS — R972 Elevated prostate specific antigen [PSA]: Secondary | ICD-10-CM | POA: Diagnosis not present

## 2017-04-07 DIAGNOSIS — H40033 Anatomical narrow angle, bilateral: Secondary | ICD-10-CM | POA: Diagnosis not present

## 2017-04-07 DIAGNOSIS — H524 Presbyopia: Secondary | ICD-10-CM | POA: Diagnosis not present

## 2017-04-07 DIAGNOSIS — H2513 Age-related nuclear cataract, bilateral: Secondary | ICD-10-CM | POA: Diagnosis not present

## 2017-04-09 DIAGNOSIS — H2513 Age-related nuclear cataract, bilateral: Secondary | ICD-10-CM | POA: Diagnosis not present

## 2017-05-01 ENCOUNTER — Encounter: Payer: Self-pay | Admitting: Family Medicine

## 2017-05-01 ENCOUNTER — Telehealth: Payer: Self-pay | Admitting: Family Medicine

## 2017-05-01 ENCOUNTER — Ambulatory Visit (INDEPENDENT_AMBULATORY_CARE_PROVIDER_SITE_OTHER): Payer: PPO | Admitting: Family Medicine

## 2017-05-01 VITALS — BP 98/60 | HR 86 | Temp 98.2°F | Ht 64.5 in | Wt 140.0 lb

## 2017-05-01 DIAGNOSIS — Z23 Encounter for immunization: Secondary | ICD-10-CM

## 2017-05-01 DIAGNOSIS — L03011 Cellulitis of right finger: Secondary | ICD-10-CM | POA: Diagnosis not present

## 2017-05-01 DIAGNOSIS — S6991XA Unspecified injury of right wrist, hand and finger(s), initial encounter: Secondary | ICD-10-CM | POA: Diagnosis not present

## 2017-05-01 MED ORDER — CEPHALEXIN 500 MG PO CAPS: 500.0000 mg | ORAL_CAPSULE | Freq: Two times a day (BID) | ORAL | 0 refills | Status: DC

## 2017-05-01 MED ORDER — MUPIROCIN 2 % EX OINT: TOPICAL_OINTMENT | CUTANEOUS | 0 refills | Status: DC

## 2017-05-01 NOTE — Patient Instructions (Addendum)
Follow up with your doctor in about a week, sooner if any worsening or concerns.  Take the keflex as instructed.  Warm soapy soaks twice daily for a few minutes then rinse and dry thoroughly and apply ointment.  Keep finger out of water or dirt otherwise.  I hope you are feeling better soon! Seek care sooner if worsening, new concerns or you are not improving with treatment.

## 2017-05-01 NOTE — Telephone Encounter (Signed)
Patient Name: Kenneth Ross DOB: 1936-03-07 Initial Comment Caller states infection for 4 weeks, soaking in salt water and antibiotic ointment . Mashed, bumped finger again, was swollen, and wondering what else to do at home for it. Draining. Nurse Assessment Nurse: Rock Nephew, RN, Juliann Pulse Date/Time (Eastern Time): 05/01/2017 9:30:58 AM Confirm and document reason for call. If symptomatic, describe symptoms. ---Caller stated he injured his right index finger ( bumped it ) about 4 weeks ago and then bumped it again in the last few days . It has been draining yellow pus . He has been using Epsom Salt soaks and abx ointment. Does the patient have any new or worsening symptoms? ---Yes Will a triage be completed? ---Yes Related visit to physician within the last 2 weeks? ---No Does the PT have any chronic conditions? (i.e. diabetes, asthma, etc.) ---No Is this a behavioral health or substance abuse call? ---No Guidelines Guideline Title Affirmed Question Affirmed Notes Wound Infection [1] Pus or cloudy fluid draining from wound AND [2] no fever Final Disposition User See Physician within [place], RN, Juliann Pulse Comments appointment made for patient by Garrel Ridgel RN with Dr, Maudie Mercury at 65 am today at the [facility] office Referrals REFERRED TO PCP OFFICE Disagree/Comply: Comply

## 2017-05-01 NOTE — Progress Notes (Signed)
HPI:  Acute visit for paronychia: -R middle finger -board dropped on finger about 1 month ago and got infected - he treated with soaks and ointment and resolved for the most part but not entirely -hit finger again last week and developed redness, swelling, drainage -now better today but still a little sore and red -no fevers, malaise, hand or finger weakness  ROS: See pertinent positives and negatives per HPI.  Past Medical History:  Diagnosis Date  . Allergy    seasonal  . Bell's palsy    with L eyelid droop at baseline as of 2016-03-25  . Cancer ([place])    kidney  . Hyperlipidemia   . Scoliosis   . Sleep apnea 08/99    Past Surgical History:  Procedure Laterality Date  . FRACTURE SURGERY  01/2003   Left Wrist  . LAMINECTOMY  1993   L3-5  . NEPHRECTOMY  10/1985  . SHOULDER OPEN ROTATOR CUFF REPAIR  12/1998   Right  . SHOULDER OPEN ROTATOR CUFF REPAIR  08/2001   Left    Family History  Problem Relation Age of Onset  . Heart disease Father   . Cancer Father        lung and prostate metastases  . Stroke Father        mini strokes  . Prostate cancer Father   . Cancer Mother        breast  . Stroke Mother        mets stroke  62  . Fibromyalgia Mother   . Arthritis Mother   . Lupus Sister   . Esophageal cancer Paternal Aunt   . Colon cancer Neg Hx     Social History   Social History  . Marital status: Married    Spouse name: N/A  . Number of children: N/A  . Years of education: N/A   Occupational History  . Retired    Social History Main Topics  . Smoking status: Never Smoker  . Smokeless tobacco: Never Used  . Alcohol use No  . Drug use: No  . Sexual activity: Yes   Other Topics Concern  . None   Social History Narrative   Retired from [employer]   Marital Status: Married 1961/03/25   Children: 2 daughters initially- one was disabled and lived with pt until she died in 2013-03-25   [employer]58-'61   Wife with cancer as of 03/26/15 (hodgkin's  lymphoma)     Current Outpatient Prescriptions:  .  aspirin 81 MG tablet, Take 81 mg by mouth daily.  , Disp: , Rfl:  .  Calcium 500-125 MG-UNIT TABS, Take 1 tablet by mouth daily.  , Disp: , Rfl:  .  Cholecalciferol (VITAMIN D) 1000 UNITS capsule, Take 1,000 Units by mouth daily.  , Disp: , Rfl:  .  diphenhydrAMINE (BENADRYL) 25 MG tablet, Take 25 mg by mouth as needed.  , Disp: , Rfl:  .  fish oil-omega-3 fatty acids 1000 MG capsule, Take 1 g by mouth daily. , Disp: , Rfl:  .  loratadine (CLARITIN) 10 MG tablet, Take 10 mg by mouth daily. , Disp: , Rfl:  .  Multiple Vitamin (MULTIVITAMIN) capsule, Take 1 capsule by mouth daily.  , Disp: , Rfl:  .  ranitidine (ZANTAC) 150 MG tablet, Take 150 mg by mouth 2 (two) times daily., Disp: , Rfl:  .  sildenafil (REVATIO) 20 MG tablet, TAKE 3 TO 5 TABLETS BY MOUTH ONCE DAILY AS NEEDED, Disp: 20  tablet, Rfl: prn .  simvastatin (ZOCOR) 40 MG tablet, Take 1 tablet (40 mg total) by mouth every evening., Disp: 90 tablet, Rfl: 3 .  cephALEXin (KEFLEX) 500 MG capsule, Take 1 capsule (500 mg total) by mouth 2 (two) times daily., Disp: 6 capsule, Rfl: 0 .  mupirocin ointment (BACTROBAN) 2 %, Apply twice daily to area of infection, Disp: 22 g, Rfl: 0  EXAM:  Vitals:   05/01/17 1111  BP: 98/60  Pulse: 86  Temp: 98.2 F (36.8 C)    Body mass index is 23.66 kg/m.  GENERAL: vitals reviewed and listed above, alert, oriented, appears well hydrated and in no acute distress  HEENT: atraumatic, conjunttiva clear, no obvious abnormalities on inspection of external nose and ears  NECK: no obvious masses on inspection  SKIN: some erythema around nail of R middle finger - mild crusting at drainage site, no appreciable fluctuance today  MS: moves all extremities without noticeable abnormality  PSYCH: pleasant and cooperative, no obvious depression or anxiety  ASSESSMENT AND PLAN:  Discussed the following assessment and plan:  Paronychia of finger of  right hand  Cellulitis of finger of right hand  Injury of right hand, initial encounter - Plan: Td vaccine greater than or equal to 7yo preservative free IM  -looks like mild cellulitis associated with paronychia - perhaps had spontaneous drainage small abscess - no abscess today -given duration infectious symptoms opted do try soaks, topical mupirocin and short course keflex with follow up in 1 week to ensure healing properly -Patient advised to return or notify a doctor immediately if symptoms worsen or persist or new concerns arise.  Patient Instructions  Follow up with your doctor in about a week, sooner if any worsening or concerns.  Take the keflex as instructed.  Warm soapy soaks twice daily for a few minutes then rinse and dry thoroughly and apply ointment.  Keep finger out of water or dirt otherwise.  I hope you are feeling better soon! Seek care sooner if worsening, new concerns or you are not improving with treatment.     Kenneth Ross R., DO

## 2017-05-01 NOTE — Telephone Encounter (Signed)
Pt has appt with Dr Colin Benton 05/01/17 at 11:15.

## 2017-05-08 ENCOUNTER — Encounter: Payer: Self-pay | Admitting: Family Medicine

## 2017-05-08 ENCOUNTER — Ambulatory Visit (INDEPENDENT_AMBULATORY_CARE_PROVIDER_SITE_OTHER): Payer: PPO | Admitting: Family Medicine

## 2017-05-08 DIAGNOSIS — L03011 Cellulitis of right finger: Secondary | ICD-10-CM

## 2017-05-08 MED ORDER — CEPHALEXIN 500 MG PO CAPS: 500.0000 mg | ORAL_CAPSULE | Freq: Three times a day (TID) | ORAL | 0 refills | Status: DC

## 2017-05-08 NOTE — Progress Notes (Signed)
Finger cellulitis f/u.  Done with keflex and still using bactroban.  No drainage for about 3 days.  Less swelling and pain.  Not as red now.  No fevers.  ROM improved.    He has urology f/u pending for summer 2018.  He has cataract surgery pending.    His wife died earlier this year and I offered condolences.  Meds, vitals, and allergies reviewed.   ROS: Per HPI unless specifically indicated in ROS section   nad ncat Affect appropriate while discussing the death of his wife. Right hand with normal inspection except for minimal irritation proximal to the middle of the nail bed on the right third finger. No fluctuant mass. Very minimal irritation locally. Normal range of motion. NV intact.

## 2017-05-08 NOTE — Patient Instructions (Signed)
Use the ointment for a few more days then stop.  If you have a return of pain and redness, then restart keflex, restart the ointment, and update me.  Take care.  Glad to see you.

## 2017-05-09 DIAGNOSIS — L03011 Cellulitis of right finger: Secondary | ICD-10-CM | POA: Insufficient documentation

## 2017-05-09 NOTE — Assessment & Plan Note (Signed)
Should resolve. Continue using Bactroban for few more days. If he has sudden increase in symptoms then restart Keflex and update [place]. Prescription given to patient. He agrees.

## 2017-06-08 DIAGNOSIS — R972 Elevated prostate specific antigen [PSA]: Secondary | ICD-10-CM | POA: Diagnosis not present

## 2017-06-10 DIAGNOSIS — H25812 Combined forms of age-related cataract, left eye: Secondary | ICD-10-CM | POA: Diagnosis not present

## 2017-06-10 DIAGNOSIS — H2512 Age-related nuclear cataract, left eye: Secondary | ICD-10-CM | POA: Diagnosis not present

## 2017-06-17 DIAGNOSIS — R972 Elevated prostate specific antigen [PSA]: Secondary | ICD-10-CM | POA: Diagnosis not present

## 2017-06-17 DIAGNOSIS — N400 Enlarged prostate without lower urinary tract symptoms: Secondary | ICD-10-CM | POA: Diagnosis not present

## 2017-06-25 DIAGNOSIS — H5712 Ocular pain, left eye: Secondary | ICD-10-CM | POA: Diagnosis not present

## 2017-08-13 ENCOUNTER — Telehealth: Payer: Self-pay | Admitting: Family Medicine

## 2017-08-13 NOTE — Telephone Encounter (Signed)
Rescheduled to 08/27/17. Pt aware

## 2017-08-13 NOTE — Telephone Encounter (Signed)
LVM for pt to call back and reschedule AWV on 9/14 due to [place] out of office.   Ebony Hail call back # [phone]

## 2017-08-14 ENCOUNTER — Other Ambulatory Visit: Payer: Self-pay | Admitting: Family Medicine

## 2017-08-14 ENCOUNTER — Other Ambulatory Visit (INDEPENDENT_AMBULATORY_CARE_PROVIDER_SITE_OTHER): Payer: PPO

## 2017-08-14 ENCOUNTER — Ambulatory Visit: Payer: PPO

## 2017-08-14 DIAGNOSIS — E7800 Pure hypercholesterolemia, unspecified: Secondary | ICD-10-CM

## 2017-08-14 LAB — COMPREHENSIVE METABOLIC PANEL
AG Ratio: 2 (calc) (ref 1.0–2.5)
ALT: 17 U/L (ref 9–46)
AST: 16 U/L (ref 10–35)
Albumin: 4.3 g/dL (ref 3.6–5.1)
Alkaline phosphatase (APISO): 47 U/L (ref 40–115)
BUN / CREAT RATIO: 11 (calc) (ref 6–22)
BUN: 16 mg/dL (ref 7–25)
CO2: 28 mmol/L (ref 20–32)
CREATININE: 1.45 mg/dL — AB (ref 0.70–1.18)
Calcium: 9.6 mg/dL (ref 8.6–10.3)
Chloride: 106 mmol/L (ref 98–110)
GLOBULIN: 2.1 g/dL (ref 1.9–3.7)
GLUCOSE: 89 mg/dL (ref 65–99)
Potassium: 4.1 mmol/L (ref 3.5–5.3)
Sodium: 141 mmol/L (ref 135–146)
Total Bilirubin: 0.6 mg/dL (ref 0.2–1.2)
Total Protein: 6.4 g/dL (ref 6.1–8.1)

## 2017-08-14 LAB — LIPID PANEL
CHOL/HDL RATIO: 3.4 (calc) (ref <5.0)
Cholesterol: 131 mg/dL (ref <200)
HDL: 38 mg/dL — AB (ref >40)
LDL CHOLESTEROL (CALC): 74 mg/dL
NON-HDL CHOLESTEROL (CALC): 93 mg/dL (ref <130)
TRIGLYCERIDES: 104 mg/dL (ref <150)

## 2017-08-21 ENCOUNTER — Ambulatory Visit (INDEPENDENT_AMBULATORY_CARE_PROVIDER_SITE_OTHER): Payer: PPO | Admitting: Family Medicine

## 2017-08-21 ENCOUNTER — Encounter: Payer: Self-pay | Admitting: Family Medicine

## 2017-08-21 VITALS — BP 116/68 | HR 90 | Temp 98.4°F | Ht 65.0 in | Wt 137.5 lb

## 2017-08-21 DIAGNOSIS — Z7189 Other specified counseling: Secondary | ICD-10-CM

## 2017-08-21 DIAGNOSIS — Z0000 Encounter for general adult medical examination without abnormal findings: Secondary | ICD-10-CM

## 2017-08-21 DIAGNOSIS — E7800 Pure hypercholesterolemia, unspecified: Secondary | ICD-10-CM

## 2017-08-21 DIAGNOSIS — S80862A Insect bite (nonvenomous), left lower leg, initial encounter: Secondary | ICD-10-CM | POA: Diagnosis not present

## 2017-08-21 DIAGNOSIS — J069 Acute upper respiratory infection, unspecified: Secondary | ICD-10-CM

## 2017-08-21 MED ORDER — SILDENAFIL CITRATE 20 MG PO TABS: ORAL_TABLET | ORAL | 99 refills | Status: DC

## 2017-08-21 MED ORDER — SIMVASTATIN 40 MG PO TABS: 40.0000 mg | ORAL_TABLET | Freq: Every evening | ORAL | 3 refills | Status: DC

## 2017-08-21 NOTE — Progress Notes (Signed)
I have personally reviewed the Medicare Annual Wellness questionnaire and have noted 1. The patient's medical and social history 2. Their use of alcohol, tobacco or illicit drugs 3. Their current medications and supplements 4. The patient's functional ability including ADL's, fall risks, home safety risks and hearing or visual             impairment. 5. Diet and physical activities 6. Evidence for depression or mood disorders  The patients weight, height, BMI have been recorded in the chart and visual acuity is per eye clinic.  I have made referrals, counseling and provided education to the patient based review of the above and I have provided the pt with a written personalized care plan for preventive services.  Provider list updated- see scanned forms.  Routine anticipatory guidance given to patient.  See health maintenance. The possibility exists that previously documented standard health maintenance information may have been brought forward from a previous encounter into this note.  If needed, that same information has been updated to reflect the current situation based on today's encounter.    Flu deferred for now.  He'll get one when feeling well.   Shingles d/w pt.  Prev had zostavax.   PNA up to date Tetanus 2018 Colonoscopy 2012 Prostate cancer screening- per urology.   Advance directive- daughter designated if patient were incapacitated.  Cognitive function addressed- see scanned forms- and if abnormal then additional documentation follows.   Elevated Cholesterol: Using medications without problems:yes Muscle aches: no Diet compliance: yes Exercise: yes  Wife died this year.  He is trying to adjust.  D/w pt.  He thought he had grief sx, not depression.  No SI/HI.  Support offered.  He has been getting out more often, going out to eat, being more social in the meantime.    He had some tick bites on the L leg.  Ticks prev removed, still with some residual skin changes that he  wanted checked.    Recently with a cold, irritated but not ST and some post nasal gtt.  No fevers.  Using cough drops and gargling.  Not much cough.  No vomiting, no diarrhea.  Some chest congestion last night.    PMH and SH reviewed  Meds, vitals, and allergies reviewed.   ROS: Per HPI.  Unless specifically indicated otherwise in HPI, the patient denies:  General: fever. Eyes: acute vision changes ENT: sore throat Cardiovascular: chest pain Respiratory: SOB GI: vomiting GU: dysuria Musculoskeletal: acute back pain Derm: acute rash Neuro: acute motor dysfunction Psych: worsening mood Endocrine: polydipsia Heme: bleeding Allergy: hayfever  GEN: nad, alert and oriented HEENT: mucous membranes moist, tm w/o erythema, nasal exam w/o erythema, clear discharge noted,  OP with cobblestoning NECK: supple w/o LA CV: rrr.   PULM: ctab, no inc wob EXT: no edema SKIN: no acute rash but some resolving postinflammatory hyperpigmentation at the previous tick bite sites on the left leg. Each is a few millimeters across. No active erythema. Scoliosis noted on the back.

## 2017-08-21 NOTE — Patient Instructions (Signed)
Check with your insurance to see if they will cover the shingrix shot.   Get a flu shot when well.   Take care.  Glad to see you.

## 2017-08-23 NOTE — Assessment & Plan Note (Addendum)
Flu deferred for now.  He'll get one when feeling well.   Shingles d/w pt.  Prev had zostavax.   PNA up to date Tetanus 2018 Colonoscopy 2012 Prostate cancer screening- per urology.   Advance directive- daughter designated if patient were incapacitated.  Cognitive function addressed- see scanned forms- and if abnormal then additional documentation follows.   The postinflammatory changes at the tick bite sites appears unremarkable. Discussed with patient. He has mild URI symptoms. Likely viral. Should resolve. Unrelated to other issues. Supportive care. He is adjusting to life as a widow. He will update me as needed.

## 2017-08-23 NOTE — Assessment & Plan Note (Signed)
Advance directive- daughter designated if patient were incapacitated.   

## 2017-08-23 NOTE — Assessment & Plan Note (Signed)
Lipids controlled. No adverse effect on medication. Continue as is. Labs discussed with patient. He agrees.

## 2017-08-27 ENCOUNTER — Ambulatory Visit: Payer: PPO

## 2017-10-26 ENCOUNTER — Encounter: Payer: Self-pay | Admitting: Family Medicine

## 2017-10-26 ENCOUNTER — Ambulatory Visit: Payer: PPO | Admitting: Family Medicine

## 2017-10-26 VITALS — BP 124/64 | HR 109 | Temp 98.0°F | Wt 134.2 lb

## 2017-10-26 DIAGNOSIS — R000 Tachycardia, unspecified: Secondary | ICD-10-CM | POA: Diagnosis not present

## 2017-10-26 LAB — COMPREHENSIVE METABOLIC PANEL
ALBUMIN: 4.5 g/dL (ref 3.5–5.2)
ALK PHOS: 48 U/L (ref 39–117)
ALT: 21 U/L (ref 0–53)
AST: 19 U/L (ref 0–37)
BILIRUBIN TOTAL: 0.5 mg/dL (ref 0.2–1.2)
BUN: 18 mg/dL (ref 6–23)
CALCIUM: 10 mg/dL (ref 8.4–10.5)
CO2: 29 mEq/L (ref 19–32)
Chloride: 103 mEq/L (ref 96–112)
Creatinine, Ser: 1.56 mg/dL — ABNORMAL HIGH (ref 0.40–1.50)
GFR: 45.87 mL/min — AB (ref >60.00)
GLUCOSE: 122 mg/dL — AB (ref 70–99)
POTASSIUM: 4.2 meq/L (ref 3.5–5.1)
Sodium: 140 mEq/L (ref 135–145)
TOTAL PROTEIN: 6.9 g/dL (ref 6.0–8.3)

## 2017-10-26 LAB — CBC WITH DIFFERENTIAL/PLATELET
BASOS ABS: 0 10*3/uL (ref 0.0–0.1)
Basophils Relative: 0.2 % (ref 0.0–3.0)
EOS PCT: 2.6 % (ref 0.0–5.0)
Eosinophils Absolute: 0.2 10*3/uL (ref 0.0–0.7)
HEMATOCRIT: 43.5 % (ref 39.0–52.0)
HEMOGLOBIN: 14.1 g/dL (ref 13.0–17.0)
LYMPHS PCT: 28.9 % (ref 12.0–46.0)
Lymphs Abs: 2.1 10*3/uL (ref 0.7–4.0)
MCHC: 32.5 g/dL (ref 30.0–36.0)
MCV: 98.6 fl (ref 78.0–100.0)
MONOS PCT: 10.7 % (ref 3.0–12.0)
Monocytes Absolute: 0.8 10*3/uL (ref 0.1–1.0)
Neutro Abs: 4.2 10*3/uL (ref 1.4–7.7)
Neutrophils Relative %: 57.6 % (ref 43.0–77.0)
Platelets: 253 10*3/uL (ref 150.0–400.0)
RBC: 4.42 Mil/uL (ref 4.22–5.81)
RDW: 14.4 % (ref 11.5–15.5)
WBC: 7.2 10*3/uL (ref 4.0–10.5)

## 2017-10-26 LAB — TSH: TSH: 2.26 u[IU]/mL (ref 0.35–4.50)

## 2017-10-26 MED ORDER — METOPROLOL TARTRATE 25 MG PO TABS: 12.5000 mg | ORAL_TABLET | Freq: Two times a day (BID) | ORAL | 3 refills | Status: DC | PRN

## 2017-10-26 NOTE — Patient Instructions (Addendum)
We'll contact you with your lab report. Take metoprolol if needed.  Take care.  Glad to see you.  Update me as needed.

## 2017-10-26 NOTE — Progress Notes (Signed)
He went to give blood and was declined by the [employer] due to inc in pulse.  That was 1 week ago.  That was the first time he knew of the issue.  No CP, SOB, BLE edema.  No decongestants.  He cut back on caffeine, down to 2 cups of tea a day max.  No FCNAVD.  He doesn't feel unwell.  He doesn't feel skipped beats or heart racing.  He checked pulse once in the meantime, still up to 97.    Meds, vitals, and allergies reviewed.   ROS: Per HPI unless specifically indicated in ROS section   GEN: nad, alert and oriented HEENT: mucous membranes moist NECK: supple w/o LA CV: rrr. PULM: ctab, no inc wob ABD: soft, +bs EXT: no edema SKIN: no acute rash

## 2017-10-27 DIAGNOSIS — R000 Tachycardia, unspecified: Secondary | ICD-10-CM | POA: Insufficient documentation

## 2017-10-27 NOTE — Assessment & Plan Note (Signed)
Regular on exam and EKG.  Labs unremarkable.  Would refer to cards given the nonspecific T wave changes.  rx given for metoprolol to start 12.5mg  QD-BID and he'll see how he feels.  He'll check pulse at home.  Could be a benign sinus tach, but I would still like cards input.  He agrees.  >25 minutes spent in face to face time with patient, >50% spent in counselling or coordination of care.

## 2017-12-12 NOTE — Progress Notes (Signed)
Cardiology Office Note  Date:  12/14/2017   ID:  Kenneth [place], DOB 04/27/1936, MRN [id number]  PCP:  Tonia Ghent, MD   Chief Complaint  Patient presents with  . other    Ref by Dr. Damita Dunnings for tachycardia. Meds reviewed by the pt. verbally. Pt. was given blood in Nov. 2018 and the nurse told him his heart rate was running too high. The patient is not aware of his heart rate beating too fast. Denies chest pain or shortness of breath.      HPI:  Kenneth Ross is a 82 year old gentleman with past medical history of Nonsmoker nondiabetes Prior history of kidney cancer, nephrectomy Who presents by referral from Dr. Damita Dunnings for tachycardia  Reports that he went to give blood and was declined by the [facility] due to inc in pulse.   Denies having any symptoms of shortness of breath, chest tightness, leg edema Denied excessive cold medicines, caffeine Denies palpitations Also had tachycardia when seen by Dr. Damita Dunnings heart rate was 100  Review of previous records shows heart rate typically 80-90 Heart rate 90 on today's visit Started on low-dose metoprolol by primary care He has not taken metoprolol in 3 days Does not know why he is taking it as he does not feel bad and has not measured high heart rate at home  Active at baseline, recently this year shoveling snow 4 days in a row with no symptoms  EKG personally reviewed by myself  showing normal sinus rhythm rate 91 bpm no significant ST or T wave changes  No significant family history of cardiac disease Reviewed with him in detail  PMH:   has a past medical history of Allergy, Bell's palsy, Cancer ([place]), Hyperlipidemia, Scoliosis, and Sleep apnea (08/99).  PSH:    Past Surgical History:  Procedure Laterality Date  . CATARACT EXTRACTION    . FRACTURE SURGERY  01/2003   Left Wrist  . LAMINECTOMY  1993   L3-5  . NEPHRECTOMY  10/1985  . SHOULDER OPEN ROTATOR CUFF REPAIR  12/1998   Right  . SHOULDER OPEN ROTATOR CUFF  REPAIR  08/2001   Left    Current Outpatient Medications  Medication Sig Dispense Refill  . aspirin 81 MG tablet Take 81 mg by mouth daily.      . Calcium 500-125 MG-UNIT TABS Take 1 tablet by mouth daily.      . Cholecalciferol (VITAMIN D) 1000 UNITS capsule Take 1,000 Units by mouth daily.      . diphenhydrAMINE (BENADRYL) 25 MG tablet Take 25 mg by mouth as needed.      . fish oil-omega-3 fatty acids 1000 MG capsule Take 1 g by mouth daily.     Marland Kitchen loratadine (CLARITIN) 10 MG tablet Take 10 mg by mouth daily.     . metoprolol tartrate (LOPRESSOR) 25 MG tablet Take 0.5-1 tablets (12.5-25 mg total) by mouth 2 (two) times daily as needed. 180 tablet 3  . Multiple Vitamin (MULTIVITAMIN) capsule Take 1 capsule by mouth daily.      . ranitidine (ZANTAC) 150 MG tablet Take 150 mg by mouth 2 (two) times daily.    . sildenafil (REVATIO) 20 MG tablet TAKE 3 TO 5 TABLETS BY MOUTH ONCE DAILY AS NEEDED 50 tablet prn  . simvastatin (ZOCOR) 40 MG tablet Take 1 tablet (40 mg total) by mouth every evening. 90 tablet 3   No current facility-administered medications for this visit.      Allergies:   Iodinated  diagnostic agents   Social History:  The patient  reports that  has never smoked. he has never used smokeless tobacco. He reports that he does not drink alcohol or use drugs.   Family History:   family history includes Arthritis in his mother; Cancer in his father and mother; Esophageal cancer in his paternal aunt; Fibromyalgia in his mother; Heart disease in his father; Lupus in his sister; Prostate cancer in his father; Stroke in his father and mother.    Review of Systems: Review of Systems  Constitutional: Negative.   Respiratory: Negative.   Cardiovascular: Negative.   Gastrointestinal: Negative.   Musculoskeletal: Negative.   Neurological: Negative.   Psychiatric/Behavioral: Negative.   All other systems reviewed and are negative.    PHYSICAL EXAM: VS:  BP 110/70 (BP Location:  Right Arm, Patient Position: Sitting, Cuff Size: Normal)   Pulse 91   Ht 5\' 5"  (1.651 m)   Wt 135 lb 4 oz (61.3 kg)   BMI 22.51 kg/m  , BMI Body mass index is 22.51 kg/m. GEN: Well nourished, well developed, in no acute distress  HEENT: normal  Neck: no JVD, carotid bruits, or masses Cardiac: RRR; no murmurs, rubs, or gallops,no edema  Respiratory:  clear to auscultation bilaterally, normal work of breathing GI: soft, nontender, nondistended, + BS MS: no deformity or atrophy , scoliosis on exam Skin: warm and dry, no rash Neuro:  Strength and sensation are intact Psych: euthymic mood, full affect    Recent Labs: 10/26/2017: ALT 21; BUN 18; Creatinine, Ser 1.56; Hemoglobin 14.1; Platelets 253.0; Potassium 4.2; Sodium 140; TSH 2.26    Lipid Panel Lab Results  Component Value Date   CHOL 131 08/14/2017   HDL 38 (L) 08/14/2017   LDLCALC 94 08/06/2016   TRIG 104 08/14/2017      Wt Readings from Last 3 Encounters:  12/14/17 135 lb 4 oz (61.3 kg)  10/26/17 134 lb 4 oz (60.9 kg)  08/21/17 137 lb 8 oz (62.4 kg)       ASSESSMENT AND PLAN:  Tachycardia - Plan: EKG 12-Lead Long discussion with him Baseline heart rate typically 80-90 as documented in previous office notes At blood  Donation in center and with Dr. Damita Dunnings heart rate slightly more than 100 Asymptomatic As he is asymptomatic heart rate rarely more than 100 typically 80-90 recommend he does not need to take metoprolol on a regular basis He is only taking metoprolol 12.5 mg twice a week on average This is acceptable, he can take it as needed for elevated heart rate if he feels appropriate Clinical exam normal, does not require significant workup such as echo  Hypercholesteremia Cholesterol is at goal on the current lipid regimen. No changes to the medications were made.  Renal cell cancer, left (HCC) discussed history of nephrectomy no recurrence  Other idiopathic scoliosis, unspecified spinal  region Asymptomatic scoliosis  Disposition:   F/U As needed   Total encounter time more than 45 minutes  Greater than 50% was spent in counseling and coordination of care with the patient    Orders Placed This Encounter  Procedures  . EKG 12-Lead     Signed, Esmond Plants, M.D., Ph.D. 12/14/2017  [place], [place]

## 2017-12-14 ENCOUNTER — Encounter: Payer: Self-pay | Admitting: Cardiovascular Disease

## 2017-12-14 ENCOUNTER — Ambulatory Visit (INDEPENDENT_AMBULATORY_CARE_PROVIDER_SITE_OTHER): Payer: PPO | Admitting: Cardiovascular Disease

## 2017-12-14 VITALS — BP 110/70 | HR 91 | Ht 65.0 in | Wt 135.2 lb

## 2017-12-14 DIAGNOSIS — R000 Tachycardia, unspecified: Secondary | ICD-10-CM | POA: Diagnosis not present

## 2017-12-14 DIAGNOSIS — M4120 Other idiopathic scoliosis, site unspecified: Secondary | ICD-10-CM | POA: Diagnosis not present

## 2017-12-14 DIAGNOSIS — C642 Malignant neoplasm of left kidney, except renal pelvis: Secondary | ICD-10-CM | POA: Diagnosis not present

## 2017-12-14 DIAGNOSIS — E7800 Pure hypercholesterolemia, unspecified: Secondary | ICD-10-CM | POA: Diagnosis not present

## 2017-12-14 NOTE — Patient Instructions (Signed)
Medication Instructions:   No medication changes made  Labwork:  No new labs needed  Testing/Procedures:  No further testing at this time   Follow-Up: It was a pleasure seeing you in the office today. Please call us if you have new issues that need to be addressed before your next appt.  [phone]  Your physician wants you to follow-up in:  As needed  If you need a refill on your cardiac medications before your next appointment, please call your pharmacy.     

## 2018-06-08 DIAGNOSIS — H2511 Age-related nuclear cataract, right eye: Secondary | ICD-10-CM | POA: Diagnosis not present

## 2018-06-08 DIAGNOSIS — Z961 Presence of intraocular lens: Secondary | ICD-10-CM | POA: Diagnosis not present

## 2018-06-10 DIAGNOSIS — H269 Unspecified cataract: Secondary | ICD-10-CM

## 2018-06-10 HISTORY — PX: CATARACT EXTRACTION: SUR2

## 2018-06-10 HISTORY — DX: Unspecified cataract: H26.9

## 2018-06-21 DIAGNOSIS — R972 Elevated prostate specific antigen [PSA]: Secondary | ICD-10-CM | POA: Diagnosis not present

## 2018-06-30 DIAGNOSIS — R972 Elevated prostate specific antigen [PSA]: Secondary | ICD-10-CM | POA: Diagnosis not present

## 2018-08-30 ENCOUNTER — Ambulatory Visit (INDEPENDENT_AMBULATORY_CARE_PROVIDER_SITE_OTHER): Payer: PPO

## 2018-08-30 ENCOUNTER — Encounter (INDEPENDENT_AMBULATORY_CARE_PROVIDER_SITE_OTHER): Payer: Self-pay

## 2018-08-30 ENCOUNTER — Other Ambulatory Visit: Payer: Self-pay | Admitting: Family Medicine

## 2018-08-30 VITALS — BP 98/60 | HR 79 | Temp 97.9°F | Ht 64.0 in | Wt 134.0 lb

## 2018-08-30 DIAGNOSIS — E7800 Pure hypercholesterolemia, unspecified: Secondary | ICD-10-CM

## 2018-08-30 DIAGNOSIS — Z23 Encounter for immunization: Secondary | ICD-10-CM

## 2018-08-30 DIAGNOSIS — Z0000 Encounter for general adult medical examination without abnormal findings: Secondary | ICD-10-CM | POA: Diagnosis not present

## 2018-08-30 LAB — COMPREHENSIVE METABOLIC PANEL
ALBUMIN: 4.4 g/dL (ref 3.5–5.2)
ALK PHOS: 60 U/L (ref 39–117)
ALT: 19 U/L (ref 0–53)
AST: 18 U/L (ref 0–37)
BILIRUBIN TOTAL: 0.5 mg/dL (ref 0.2–1.2)
BUN: 23 mg/dL (ref 6–23)
CALCIUM: 9.8 mg/dL (ref 8.4–10.5)
CO2: 31 mEq/L (ref 19–32)
CREATININE: 1.45 mg/dL (ref 0.40–1.50)
Chloride: 103 mEq/L (ref 96–112)
GFR: 49.8 mL/min — ABNORMAL LOW (ref >60.00)
Glucose, Bld: 103 mg/dL — ABNORMAL HIGH (ref 70–99)
Potassium: 4.5 mEq/L (ref 3.5–5.1)
SODIUM: 140 meq/L (ref 135–145)
TOTAL PROTEIN: 6.9 g/dL (ref 6.0–8.3)

## 2018-08-30 LAB — LIPID PANEL
CHOLESTEROL: 161 mg/dL (ref 0–200)
HDL: 31.4 mg/dL — ABNORMAL LOW (ref >39.00)
NonHDL: 129.45
Total CHOL/HDL Ratio: 5
Triglycerides: 219 mg/dL — ABNORMAL HIGH (ref 0.0–149.0)
VLDL: 43.8 mg/dL — ABNORMAL HIGH (ref 0.0–40.0)

## 2018-08-30 LAB — LDL CHOLESTEROL, DIRECT: Direct LDL: 104 mg/dL

## 2018-08-30 NOTE — Patient Instructions (Addendum)
Mr. Cush , Thank you for taking time to come for your Medicare Wellness Visit. I appreciate your ongoing commitment to your health goals. Please review the following plan we discussed and let me know if I can assist you in the future.   These are the goals we discussed: Goals    . Patient Stated     Starting 08/30/2018, I will continue to take medications as prescribed.        This is a list of the screening recommended for you and due dates:  Health Maintenance  Topic Date Due  . Tetanus Vaccine  05/02/2027  . Flu Shot  Completed  . Pneumonia vaccines  Completed    Preventive Care for Adults  A healthy lifestyle and preventive care can promote health and wellness. Preventive health guidelines for adults include the following key practices.  . A routine yearly physical is a good way to check with your health care provider about your health and preventive screening. It is a chance to share any concerns and updates on your health and to receive a thorough exam.  . Visit your dentist for a routine exam and preventive care every 6 months. Brush your teeth twice a day and floss once a day. Good oral hygiene prevents tooth decay and gum disease.  . The frequency of eye exams is based on your age, health, family medical history, use  of contact lenses, and other factors. Follow your health care provider's recommendations for frequency of eye exams.  . Eat a healthy diet. Foods like vegetables, fruits, whole grains, low-fat dairy products, and lean protein foods contain the nutrients you need without too many calories. Decrease your intake of foods high in solid fats, added sugars, and salt. Eat the right amount of calories for you. Get information about a proper diet from your health care provider, if necessary.  . Regular physical exercise is one of the most important things you can do for your health. Most adults should get at least 150 minutes of moderate-intensity exercise (any  activity that increases your heart rate and causes you to sweat) each week. In addition, most adults need muscle-strengthening exercises on 2 or more days a week.  Silver Sneakers may be a benefit available to you. To determine eligibility, you may visit the website: www.silversneakers.com or contact program at (210)496-3162 Mon-Fri between 8AM-8PM.   . Maintain a healthy weight. The body mass index (BMI) is a screening tool to identify possible weight problems. It provides an estimate of body fat based on height and weight. Your health care provider can find your BMI and can help you achieve or maintain a healthy weight.   For adults 20 years and older: ? A BMI below 18.5 is considered underweight. ? A BMI of 18.5 to 24.9 is normal. ? A BMI of 25 to 29.9 is considered overweight. ? A BMI of 30 and above is considered obese.   . Maintain normal blood lipids and cholesterol levels by exercising and minimizing your intake of saturated fat. Eat a balanced diet with plenty of fruit and vegetables. Blood tests for lipids and cholesterol should begin at age 32 and be repeated every 5 years. If your lipid or cholesterol levels are high, you are over 50, or you are at high risk for heart disease, you may need your cholesterol levels checked more frequently. Ongoing high lipid and cholesterol levels should be treated with medicines if diet and exercise are not working.  . If you smoke,  find out from your health care provider how to quit. If you do not use tobacco, please do not start.  . If you choose to drink alcohol, please do not consume more than 2 drinks per day. One drink is considered to be 12 ounces (355 mL) of beer, 5 ounces (148 mL) of wine, or 1.5 ounces (44 mL) of liquor.  . If you are 53-27 years old, ask your health care provider if you should take aspirin to prevent strokes.  . Use sunscreen. Apply sunscreen liberally and repeatedly throughout the day. You should seek shade when your  shadow is shorter than you. Protect yourself by wearing long sleeves, pants, a wide-brimmed hat, and sunglasses year round, whenever you are outdoors.  . Once a month, do a whole body skin exam, using a mirror to look at the skin on your back. Tell your health care provider of new moles, moles that have irregular borders, moles that are larger than a pencil eraser, or moles that have changed in shape or color.

## 2018-08-30 NOTE — Progress Notes (Signed)
Subjective:   Kenneth [place] is a 82 y.o. male who presents for Medicare Annual/Subsequent preventive examination.  Review of Systems:  N/A Cardiac Risk Factors include: advanced age (>67men, >27 women);dyslipidemia     Objective:    Vitals: BP 98/60 (BP Location: Right Arm, Patient Position: Sitting, Cuff Size: Normal)   Pulse 79   Temp 97.9 F (36.6 C) (Oral)   Ht 5\' 4"  (1.626 m) Comment: no shoes  Wt 134 lb (60.8 kg)   SpO2 97%   BMI 23.00 kg/m   Body mass index is 23 kg/m.  Advanced Directives 08/30/2018  Does Patient Have a Medical Advance Directive? Yes  Type of Paramedic of [place];Living will  Does patient want to make changes to medical advance directive? Yes (MAU/Ambulatory/Procedural Areas - Information given)  Copy of [place] in Chart? No - copy requested    Tobacco Social History   Tobacco Use  Smoking Status Never Smoker  Smokeless Tobacco Never Used     Counseling given: No   Clinical Intake:  Pre-visit preparation completed: Yes  Pain : No/denies pain Pain Score: 0-No pain     Nutritional Status: BMI of 19-24  Normal Nutritional Risks: None Diabetes: No  How often do you need to have someone help you when you read instructions, pamphlets, or other written materials from your doctor or pharmacy?: 1 - Never What is the last grade level you completed in school?: 12th grade  Interpreter Needed?: No  Comments: pt is a widow and lives alone Information entered by :: LPinson, LPN  Past Medical History:  Diagnosis Date  . Allergy    seasonal  . Bell's palsy    with L eyelid droop at baseline as of 2017  . Cancer ([place])    kidney  . Cataract 06/10/2018   Dr. Tacy Dura with surgery  . Hyperlipidemia   . Scoliosis   . Sleep apnea 08/99   Past Surgical History:  Procedure Laterality Date  . CATARACT EXTRACTION Left 06/10/2018   Dr. Valetta Close  . FRACTURE SURGERY  01/2003   Left Wrist    . LAMINECTOMY  1993   L3-5  . NEPHRECTOMY  10/1985  . SHOULDER OPEN ROTATOR CUFF REPAIR  12/1998   Right  . SHOULDER OPEN ROTATOR CUFF REPAIR  08/2001   Left   Family History  Problem Relation Age of Onset  . Heart disease Father   . Cancer Father        lung and prostate metastases  . Stroke Father        mini strokes  . Prostate cancer Father   . Cancer Mother        breast  . Stroke Mother        mets stroke  36  . Fibromyalgia Mother   . Arthritis Mother   . Lupus Sister   . Esophageal cancer Paternal Aunt   . Colon cancer Neg Hx    Social History   Socioeconomic History  . Marital status: Married    Spouse name: Not on file  . Number of children: Not on file  . Years of education: Not on file  . Highest education level: Not on file  Occupational History  . Occupation: Retired  Scientific laboratory technician  . Financial resource strain: Not on file  . Food insecurity:    Worry: Not on file    Inability: Not on file  . Transportation needs:    Medical: Not on file  Non-medical: Not on file  Tobacco Use  . Smoking status: Never Smoker  . Smokeless tobacco: Never Used  Substance and Sexual Activity  . Alcohol use: No    Alcohol/week: 0.0 standard drinks  . Drug use: No  . Sexual activity: Yes  Lifestyle  . Physical activity:    Days per week: Not on file    Minutes per session: Not on file  . Stress: Not on file  Relationships  . Social connections:    Talks on phone: Not on file    Gets together: Not on file    Attends religious service: Not on file    Active member of club or organization: Not on file    Attends meetings of clubs or organizations: Not on file    Relationship status: Not on file  Other Topics Concern  . Not on file  Social History Narrative   Retired from [place]   Marital Status: Married 03/14/1961, widowed 03/14/17.  Wife died from hodgkin's lymphoma   Children: 2 daughters initially- one was disabled and lived with pt  until she died in 03-14-13   [employer]58-'[employer], deployed to [place]    Outpatient Encounter Medications as of 08/30/2018  Medication Sig  . aspirin 81 MG tablet Take 81 mg by mouth daily.    . Calcium 500-125 MG-UNIT TABS Take 1 tablet by mouth daily.    . Cholecalciferol (VITAMIN D) 1000 UNITS capsule Take 1,000 Units by mouth daily.    . diphenhydrAMINE (BENADRYL) 25 MG tablet Take 25 mg by mouth as needed.    . fish oil-omega-3 fatty acids 1000 MG capsule Take 1 g by mouth daily.   Marland Kitchen loratadine (CLARITIN) 10 MG tablet Take 10 mg by mouth daily.   . metoprolol tartrate (LOPRESSOR) 25 MG tablet Take 0.5-1 tablets (12.5-25 mg total) by mouth 2 (two) times daily as needed.  . Multiple Vitamin (MULTIVITAMIN) capsule Take 1 capsule by mouth daily.    . ranitidine (ZANTAC) 150 MG tablet Take 150 mg by mouth 2 (two) times daily.  . sildenafil (REVATIO) 20 MG tablet TAKE 3 TO 5 TABLETS BY MOUTH ONCE DAILY AS NEEDED  . simvastatin (ZOCOR) 40 MG tablet Take 1 tablet (40 mg total) by mouth every evening.   No facility-administered encounter medications on file as of 08/30/2018.     Activities of Daily Living In your present state of health, do you have any difficulty performing the following activities: 08/30/2018  Hearing? N  Vision? N  Difficulty concentrating or making decisions? N  Walking or climbing stairs? N  Dressing or bathing? N  Doing errands, shopping? N  Preparing Food and eating ? N  Using the Toilet? N  In the past six months, have you accidently leaked urine? N  Do you have problems with loss of bowel control? N  Managing your Medications? N  Managing your Finances? N  Housekeeping or managing your Housekeeping? N  Some recent data might be hidden    Patient Care Team: Kenneth Ghent, MD as PCP - General (Family Medicine)   Assessment:   This is a routine wellness examination for [place].   Hearing Screening   125Hz  250Hz  500Hz  1000Hz  2000Hz  3000Hz  4000Hz  6000Hz  8000Hz   Right  ear:   40 40 40  40    Left ear:   40 40 40  0    Vision Screening Comments: Vision exam in July 2019 with [facility]    Exercise Activities  and Dietary recommendations Current Exercise Habits: The patient does not participate in regular exercise at present, Exercise limited by: None identified  Goals    . Patient Stated     Starting 08/30/2018, I will continue to take medications as prescribed.        Fall Risk Fall Risk  08/30/2018 08/11/2016 08/08/2015 08/02/2014 07/28/2013  Falls in the past year? No No No No No   Depression Screen PHQ 2/9 Scores 08/30/2018 08/11/2016 08/08/2015 08/02/2014  PHQ - 2 Score 0 0 0 0  PHQ- 9 Score 0 - - -    Cognitive Function MMSE - Mini Mental State Exam 08/30/2018  Orientation to time 5  Orientation to Place 5  Registration 3  Attention/ Calculation 0  Recall 3  Language- name 2 objects 0  Language- repeat 1  Language- follow 3 step command 3  Language- read & follow direction 0  Write a sentence 0  Copy design 0  Total score 20   PLEASE NOTE: A Mini-Cog screen was completed. Maximum score is 20. A value of 0 denotes this part of [place] MMSE was not completed or the patient failed this part of the Mini-Cog screening.   Mini-Cog Screening Orientation to Time - Max 5 pts Orientation to Place - Max 5 pts Registration - Max 3 pts Recall - Max 3 pts Language Repeat - Max 1 pts Language Follow 3 Step Command - Max 3 pts      Immunization History  Administered Date(s) Administered  . Influenza Split 09/04/2011  . Influenza Whole 11/13/2006, 09/10/2007, 09/11/2008, 08/20/2010  . Influenza, High Dose Seasonal PF 09/15/2017, 08/30/2018  . Influenza,inj,Quad PF,6+ Mos 08/02/2014, 08/08/2015, 08/11/2016  . Pneumococcal Conjugate-13 08/08/2015  . Pneumococcal Polysaccharide-23 08/02/2004  . Td 05/01/1996, 12/01/2005, 05/01/2017  . Zoster 07/13/2013  . Zoster Recombinat (Shingrix) 09/15/2017   Screening Tests Health  Maintenance  Topic Date Due  . TETANUS/TDAP  05/02/2027  . INFLUENZA VACCINE  Completed  . PNA vac Low Risk Adult  Completed       Plan:  I have personally reviewed, addressed, and noted the following in the patient's chart:  A. Medical and social history B. Use of alcohol, tobacco or illicit drugs  C. Current medications and supplements D. Functional ability and status E.  Nutritional status F.  Physical activity G. Advance directives H. List of other physicians I.  Hospitalizations, surgeries, and ER visits in previous 12 months J.  [place] to include hearing, vision, cognitive, depression L. Referrals and appointments - none  In addition, I have reviewed and discussed with patient certain preventive protocols, quality metrics, and best practice recommendations. A written personalized care plan for preventive services as well as general preventive health recommendations were provided to patient.  See attached scanned questionnaire for additional information.   Signed,   Lindell Noe, MHA, BS, LPN Health Coach

## 2018-08-30 NOTE — Progress Notes (Signed)
PCP notes:   Health maintenance:  Flu vaccine - administered  Abnormal screenings:   Hearing - failed  Hearing Screening   125Hz  250Hz  500Hz  1000Hz  2000Hz  3000Hz  4000Hz  6000Hz  8000Hz   Right ear:   40 40 40  40    Left ear:   40 40 40  0      Patient concerns:   None  Nurse concerns:  None  Next PCP appt:   08/31/18  @ 0845  I reviewed health advisor's note, was available for consultation on the day of service listed in this note, and agree with documentation and plan. Elsie Stain, MD.

## 2018-08-31 ENCOUNTER — Ambulatory Visit (INDEPENDENT_AMBULATORY_CARE_PROVIDER_SITE_OTHER): Payer: PPO | Admitting: Family Medicine

## 2018-08-31 ENCOUNTER — Encounter: Payer: Self-pay | Admitting: Family Medicine

## 2018-08-31 VITALS — BP 116/68 | HR 90 | Temp 98.4°F | Ht 65.0 in | Wt 137.5 lb

## 2018-08-31 DIAGNOSIS — M4120 Other idiopathic scoliosis, site unspecified: Secondary | ICD-10-CM

## 2018-08-31 DIAGNOSIS — C642 Malignant neoplasm of left kidney, except renal pelvis: Secondary | ICD-10-CM

## 2018-08-31 DIAGNOSIS — Z7189 Other specified counseling: Secondary | ICD-10-CM

## 2018-08-31 DIAGNOSIS — N289 Disorder of kidney and ureter, unspecified: Secondary | ICD-10-CM

## 2018-08-31 DIAGNOSIS — E7800 Pure hypercholesterolemia, unspecified: Secondary | ICD-10-CM | POA: Diagnosis not present

## 2018-08-31 DIAGNOSIS — N529 Male erectile dysfunction, unspecified: Secondary | ICD-10-CM

## 2018-08-31 DIAGNOSIS — Z8042 Family history of malignant neoplasm of prostate: Secondary | ICD-10-CM

## 2018-08-31 DIAGNOSIS — R000 Tachycardia, unspecified: Secondary | ICD-10-CM | POA: Diagnosis not present

## 2018-08-31 DIAGNOSIS — Z0000 Encounter for general adult medical examination without abnormal findings: Secondary | ICD-10-CM | POA: Insufficient documentation

## 2018-08-31 MED ORDER — SILDENAFIL CITRATE 20 MG PO TABS: ORAL_TABLET | ORAL | 99 refills | Status: DC

## 2018-08-31 MED ORDER — SIMVASTATIN 40 MG PO TABS: 40.0000 mg | ORAL_TABLET | Freq: Every evening | ORAL | 3 refills | Status: DC

## 2018-08-31 NOTE — Patient Instructions (Signed)
Take care.  Glad to see you. Update me as needed.  

## 2018-08-31 NOTE — Assessment & Plan Note (Signed)
Nephrectomy noted.  Cr stable, d/w pt.

## 2018-08-31 NOTE — Assessment & Plan Note (Signed)
Released by cards.  No recent need for BB.  Continue as is. He agrees.

## 2018-08-31 NOTE — Assessment & Plan Note (Signed)
H/o nephrectomy.  He avoids nsaids as much as possible, d/w pt nsaid cautions.  He is still seeing urology.  I'll defer.  He agrees.

## 2018-08-31 NOTE — Assessment & Plan Note (Signed)
ED treated with sildenafil.  No ADE on med.  No NTG use.

## 2018-08-31 NOTE — Assessment & Plan Note (Signed)
Per urology, I'll defer.  He agrees.

## 2018-08-31 NOTE — Assessment & Plan Note (Signed)
Advance directive- daughter designated if patient were incapacitated.   

## 2018-08-31 NOTE — Assessment & Plan Note (Signed)
Would continue work on diet and exercise.  He'll get back to taking statin daily and update me as needed.  He agrees.  >25 minutes spent in face to face time with patient, >50% spent in counselling or coordination of care, discussing HLD, nephrectomy hx, ED, etc.

## 2018-08-31 NOTE — Progress Notes (Signed)
He has metoprolol to use as needed, but not used recently.  No CP, SOB, BLE edema.  He was released by cardiology.    Elevated Cholesterol: Using medications without problems: no ADE on med but was out of the habit of taking medicine.  Muscle aches: some aches but could be from scoliosis vs activity, less likely from statin.  D/w pt.  He agrees.   Diet compliance: yes Exercise:yes Labs d/w pt.  He wasn't fasting.    H/o nephrectomy.  He avoids nsaids as much as possible, d/w pt nsaid cautions.  He is still seeing urology.    He is living at home, alone, d/w pt.  He is adjusting to the change.  He is engaged.  He feels good about that.   D/w pt.    ED treated with sildenafil.  No ADE on med.  No NTG use.    Hearing screening failed, declined hearing aids.   Flu shot prev done.  Shingles done.   PNA up to date Tetanus 2018 Colonoscopy 2012 Prostate cancer screening- per urology.   Advance directive- daughter designated if patient were incapacitated.   PMH and SH reviewed  Meds, vitals, and allergies reviewed.   ROS: Per HPI unless specifically indicated in ROS section   GEN: nad, alert and oriented HEENT: mucous membranes moist NECK: supple w/o LA CV: rrr. PULM: ctab, no inc wob ABD: soft, +bs EXT: no edema SKIN: no acute rash Scoliosis noted.

## 2018-08-31 NOTE — Assessment & Plan Note (Signed)
Hearing screening failed, declined hearing aids.   Flu shot prev done.  Shingles done.   PNA up to date Tetanus 2018 Colonoscopy 2012 Prostate cancer screening- per urology.   Advance directive- daughter designated if patient were incapacitated.

## 2018-08-31 NOTE — Assessment & Plan Note (Signed)
He is managing as is.

## 2018-09-09 ENCOUNTER — Encounter: Payer: Self-pay | Admitting: Family Medicine

## 2018-09-09 ENCOUNTER — Ambulatory Visit (INDEPENDENT_AMBULATORY_CARE_PROVIDER_SITE_OTHER): Payer: PPO | Admitting: Family Medicine

## 2018-09-09 DIAGNOSIS — J069 Acute upper respiratory infection, unspecified: Secondary | ICD-10-CM | POA: Diagnosis not present

## 2018-09-09 MED ORDER — FLUTICASONE PROPIONATE 50 MCG/ACT NA SUSP: 2.0000 | Freq: Every day | NASAL | Status: DC

## 2018-09-09 MED ORDER — AMOXICILLIN-POT CLAVULANATE 875-125 MG PO TABS: 1.0000 | ORAL_TABLET | Freq: Two times a day (BID) | ORAL | 0 refills | Status: DC

## 2018-09-09 NOTE — Patient Instructions (Addendum)
Use nasal saline as needed.  Use flonase 2 sprays in each nostril.  Start augmentin.  Rest and fluids.  Update me as needed.  Take care.  Glad to see you.

## 2018-09-09 NOTE — Progress Notes (Signed)
duration of symptoms: about 3 weeks ago.  He thought he was prev getting better but then more sx in the meantime.   Rhinorrhea: yes Congestion: yes ear pain: some mild ear pain sore throat: some, noted today.  Cough: some today and last night.   Yellow sputum.  No known fevers.  Myalgias: no Facial pain: yes, HA- frontal.   Voice has been raspy.  Post nasal gtt with abnormal taste noted.    Per HPI unless specifically indicated in ROS section  Meds, vitals, and allergies reviewed.   GEN: nad, alert and oriented HEENT: mucous membranes moist, TM w/o erythema, nasal epithelium injected, OP with cobblestoning NECK: supple w/o LA CV: rrr. PULM: ctab, no inc wob ABD: soft, +bs EXT: no edema

## 2018-09-12 DIAGNOSIS — J069 Acute upper respiratory infection, unspecified: Secondary | ICD-10-CM | POA: Insufficient documentation

## 2018-09-12 NOTE — Assessment & Plan Note (Signed)
Likely.  Nontoxic.  Okay for outpatient follow-up. Use nasal saline as needed.  Use flonase 2 sprays in each nostril.  Start augmentin.  Rest and fluids.  Update me as needed.  He agrees.

## 2018-12-14 DIAGNOSIS — H2511 Age-related nuclear cataract, right eye: Secondary | ICD-10-CM | POA: Diagnosis not present

## 2019-06-16 DIAGNOSIS — H2511 Age-related nuclear cataract, right eye: Secondary | ICD-10-CM | POA: Diagnosis not present

## 2019-07-22 DIAGNOSIS — N5201 Erectile dysfunction due to arterial insufficiency: Secondary | ICD-10-CM | POA: Diagnosis not present

## 2019-07-22 DIAGNOSIS — R972 Elevated prostate specific antigen [PSA]: Secondary | ICD-10-CM | POA: Diagnosis not present

## 2019-09-01 ENCOUNTER — Other Ambulatory Visit (INDEPENDENT_AMBULATORY_CARE_PROVIDER_SITE_OTHER): Payer: PPO

## 2019-09-01 ENCOUNTER — Other Ambulatory Visit: Payer: Self-pay | Admitting: Family Medicine

## 2019-09-01 ENCOUNTER — Ambulatory Visit: Payer: PPO

## 2019-09-01 DIAGNOSIS — E7800 Pure hypercholesterolemia, unspecified: Secondary | ICD-10-CM

## 2019-09-01 LAB — LIPID PANEL
Cholesterol: 146 mg/dL (ref 0–200)
HDL: 41.2 mg/dL (ref >39.00)
LDL Cholesterol: 86 mg/dL (ref 0–99)
NonHDL: 104.8
Total CHOL/HDL Ratio: 4
Triglycerides: 96 mg/dL (ref 0.0–149.0)
VLDL: 19.2 mg/dL (ref 0.0–40.0)

## 2019-09-01 LAB — COMPREHENSIVE METABOLIC PANEL
ALT: 20 U/L (ref 0–53)
AST: 18 U/L (ref 0–37)
Albumin: 4.6 g/dL (ref 3.5–5.2)
Alkaline Phosphatase: 56 U/L (ref 39–117)
BUN: 22 mg/dL (ref 6–23)
CO2: 29 mEq/L (ref 19–32)
Calcium: 9.9 mg/dL (ref 8.4–10.5)
Chloride: 105 mEq/L (ref 96–112)
Creatinine, Ser: 1.48 mg/dL (ref 0.40–1.50)
GFR: 45.64 mL/min — ABNORMAL LOW (ref >60.00)
Glucose, Bld: 96 mg/dL (ref 70–99)
Potassium: 4.5 mEq/L (ref 3.5–5.1)
Sodium: 141 mEq/L (ref 135–145)
Total Bilirubin: 0.8 mg/dL (ref 0.2–1.2)
Total Protein: 6.7 g/dL (ref 6.0–8.3)

## 2019-09-06 ENCOUNTER — Ambulatory Visit (INDEPENDENT_AMBULATORY_CARE_PROVIDER_SITE_OTHER): Payer: PPO | Admitting: Family Medicine

## 2019-09-06 ENCOUNTER — Encounter: Payer: Self-pay | Admitting: Family Medicine

## 2019-09-06 ENCOUNTER — Other Ambulatory Visit: Payer: Self-pay

## 2019-09-06 VITALS — BP 142/70 | HR 72 | Temp 97.8°F | Resp 16 | Ht 64.0 in | Wt 133.2 lb

## 2019-09-06 DIAGNOSIS — Z0000 Encounter for general adult medical examination without abnormal findings: Secondary | ICD-10-CM

## 2019-09-06 DIAGNOSIS — E7800 Pure hypercholesterolemia, unspecified: Secondary | ICD-10-CM

## 2019-09-06 DIAGNOSIS — R972 Elevated prostate specific antigen [PSA]: Secondary | ICD-10-CM | POA: Diagnosis not present

## 2019-09-06 DIAGNOSIS — Z7189 Other specified counseling: Secondary | ICD-10-CM

## 2019-09-06 MED ORDER — SIMVASTATIN 40 MG PO TABS: 40.0000 mg | ORAL_TABLET | Freq: Every evening | ORAL | 3 refills | Status: DC

## 2019-09-06 MED ORDER — SILDENAFIL CITRATE 20 MG PO TABS: ORAL_TABLET | ORAL | 99 refills | Status: DC

## 2019-09-06 NOTE — Patient Instructions (Signed)
Take care.  Glad to see you.  Update me as needed.  I'll await the notes from urology.  Thanks for getting a flu shot.

## 2019-09-06 NOTE — Progress Notes (Signed)
I have personally reviewed the Medicare Annual Wellness questionnaire and have noted 1. The patient's medical and social history 2. Their use of alcohol, tobacco or illicit drugs 3. Their current medications and supplements 4. The patient's functional ability including ADL's, fall risks, home safety risks and hearing or visual             impairment. 5. Diet and physical activities 6. Evidence for depression or mood disorders  The patients weight, height, BMI have been recorded in the chart and visual acuity is per eye clinic.  I have made referrals, counseling and provided education to the patient based review of the above and I have provided the pt with a written personalized care plan for preventive services.  Provider list updated- see scanned forms.  Routine anticipatory guidance given to patient.  See health maintenance. The possibility exists that previously documented standard health maintenance information may have been brought forward from a previous encounter into this note.  If needed, that same information has been updated to reflect the current situation based on today's encounter.    Flu 2020 Shingles up-to-date PNA up-to-date Tetanus 2018 Colon cancer screening not applicable given age 83.  He agrees. Prostate cancer screening per urology. Advance directive-wife and daughter are equally designated if patient were incapacitated. Cognitive function addressed- see scanned forms- and if abnormal then additional documentation follows.   He got remarried in the meantime, d/w pt.  He is doing well at home.    Elevated Cholesterol: Using medications without problems: yes Muscle aches: no Diet compliance: yes Exercise:yes  ED noted.  Has used sildenafil.  He has prostate biopsy pending.   I will defer to urology.  He agrees.  No metoprolol use or recent need.  He has some L 2nd and 3rd finger tingling at night, also with pain at night.  D/w pt about OTC night splint.   He  will update me as needed.  PMH and SH reviewed  Meds, vitals, and allergies reviewed.   ROS: Per HPI.  Unless specifically indicated otherwise in HPI, the patient denies:  General: fever. Eyes: acute vision changes ENT: sore throat Cardiovascular: chest pain Respiratory: SOB GI: vomiting GU: dysuria Musculoskeletal: acute back pain Derm: acute rash Neuro: acute motor dysfunction Psych: worsening mood Endocrine: polydipsia Heme: bleeding Allergy: hayfever  GEN: nad, alert and oriented HEENT:ncat NECK: supple w/o LA CV: rrr. PULM: ctab, no inc wob ABD: soft, +bs EXT: no edema SKIN: no acute rash  Health Maintenance  Topic Date Due  . TETANUS/TDAP  05/02/2027  . INFLUENZA VACCINE  Completed  . PNA vac Low Risk Adult  Completed

## 2019-09-07 DIAGNOSIS — C61 Malignant neoplasm of prostate: Secondary | ICD-10-CM | POA: Diagnosis not present

## 2019-09-07 DIAGNOSIS — R972 Elevated prostate specific antigen [PSA]: Secondary | ICD-10-CM | POA: Diagnosis not present

## 2019-09-07 DIAGNOSIS — D075 Carcinoma in situ of prostate: Secondary | ICD-10-CM | POA: Diagnosis not present

## 2019-09-07 NOTE — Assessment & Plan Note (Signed)
Per urology.  I will defer.  He agrees. 

## 2019-09-07 NOTE — Assessment & Plan Note (Signed)
Lipids are reasonable.  Labs discussed with patient.  Continue statin.  He agrees.

## 2019-09-07 NOTE — Assessment & Plan Note (Signed)
Flu 2020 Shingles up-to-date PNA up-to-date Tetanus 2018 Colon cancer screening not applicable given age 83.  He agrees. Prostate cancer screening per urology. Advance directive-wife and daughter are equally designated if patient were incapacitated. Cognitive function addressed- see scanned forms- and if abnormal then additional documentation follows.   He got remarried in the meantime, d/w pt.  He is doing well at home.

## 2019-09-07 NOTE — Assessment & Plan Note (Signed)
Advance directive-wife and daughter are equally designated if patient were incapacitated.

## 2019-09-12 ENCOUNTER — Encounter: Payer: Self-pay | Admitting: *Deleted

## 2019-09-12 NOTE — Progress Notes (Signed)
GU Location of Tumor / Histology: prostatic adenocarcinoma  If Prostate Cancer, Gleason Score is (4 + 3) and PSA is (5.9). Prostate volume: 42.71 grams.  KYLIL CHIARI was initially referred in January 2018 by Dr. Jodi Marble for evaluation and management of elevated PSA.     Biopsies of prostate (if applicable) revealed:    Past/Anticipated interventions by urology, if any: prostate biopsy, referral for consideration of radiotherapy  Past/Anticipated interventions by medical oncology, if any: no  Weight changes, if any: no  Bowel/Bladder complaints, if any: IPSS 13. SHIM 20. Denies dysuria. Reports hematuria is slowly resolving since biopsy last week. Denies urinary leakage or incontinence.    Nausea/Vomiting, if any: no  Pain issues, if any:  denies  SAFETY ISSUES:  Prior radiation? Denies   Pacemaker/ICD? denies  Possible current pregnancy? no, male patient  Is the patient on methotrexate? Denies.  Current Complaints / other details:  83 year old male. Married. Retired. Had two children but one passed in 2014.   Father dx with prostate ca later in life but died of a stroke. Mother with hx of breast ca.

## 2019-09-13 ENCOUNTER — Other Ambulatory Visit: Payer: Self-pay

## 2019-09-13 ENCOUNTER — Ambulatory Visit
Admission: RE | Admit: 2019-09-13 | Discharge: 2019-09-13 | Disposition: A | Discharge status: Home / Self Care | Payer: PPO | Source: Ambulatory Visit | Attending: Radiation Oncology | Admitting: Radiation Oncology

## 2019-09-13 ENCOUNTER — Encounter: Payer: Self-pay | Admitting: Radiation Oncology

## 2019-09-13 ENCOUNTER — Telehealth: Payer: Self-pay | Admitting: *Deleted

## 2019-09-13 VITALS — Ht 64.0 in | Wt 133.0 lb

## 2019-09-13 DIAGNOSIS — C61 Malignant neoplasm of prostate: Secondary | ICD-10-CM

## 2019-09-13 DIAGNOSIS — R972 Elevated prostate specific antigen [PSA]: Secondary | ICD-10-CM | POA: Diagnosis not present

## 2019-09-13 DIAGNOSIS — Z8042 Family history of malignant neoplasm of prostate: Secondary | ICD-10-CM | POA: Diagnosis not present

## 2019-09-13 HISTORY — DX: Malignant neoplasm of prostate: C61

## 2019-09-13 NOTE — Telephone Encounter (Signed)
Called patient to ask questions, spoke with patient 

## 2019-09-13 NOTE — Progress Notes (Signed)
See progress note under physician encounter. 

## 2019-09-13 NOTE — Progress Notes (Signed)
Radiation Oncology         [phone] ________________________________  Initial outpatient Consultation - Conducted via Telephone due to current COVID-19 concerns for limiting patient exposure  Name: Kenneth Ross MRN: [id number]  Date: 09/13/2019  DOB: [birth year]  [id number], [name], MD  Franchot Gallo, MD   REFERRING PHYSICIAN: Franchot Gallo, MD  DIAGNOSIS: 83 y.o. gentleman with Stage T1c adenocarcinoma of the prostate with Gleason score of 4+3, and PSA of 5.9.    ICD-10-CM   1. Malignant neoplasm of prostate ([place])  C61     HISTORY OF PRESENT ILLNESS: Kenneth Ross is an 83 y.o. male with a longstanding history of elevated PSA and new diagnosis of prostate cancer. He was initially noted to have an elevated PSA of 4.3 by his primary care physician, Dr. Damita Dunnings in 10/2016.  Accordingly, he was referred for evaluation in urology by Dr. Diona Fanti on 12/10/2016,  digital rectal examination performed at that time was normal. Given his advanced age and benign DRE, Dr. Diona Fanti opted to monitor the patient's PSA closely.  His PSA remained stable at 4.24 in 05/2017 but increased to 5.36 in 05/2018. His PSA was further increased at 5.9 on 07/22/19, therefore, they opted to proceed with prostate biopsy. A transrectal ultrasound with 12 biopsies of the prostate was performed on 09/07/2019.  The prostate volume measured 42.71 cc.  Out of 12 core biopsies, 3 were positive.  The maximum Gleason score was 4+3, and this was seen in left apex (15%). Additionally, Gleason 3+4 was seen in right apex (25%) and right apex lateral (36%) and HG/PIN was seen in the right base.  The patient reviewed the biopsy results with his urologist and he has kindly been referred today for discussion of potential radiation treatment options.   PREVIOUS RADIATION THERAPY: No  PAST MEDICAL HISTORY:  Past Medical History:  Diagnosis Date  . Allergy    seasonal  . Bell's palsy    with L eyelid droop at  baseline as of 2017  . Cancer ([place])    kidney  . Cataract 06/10/2018   Dr. Tacy Dura with surgery  . Hyperlipidemia   . Prostate cancer ([place])   . Scoliosis   . Sleep apnea 08/99      PAST SURGICAL HISTORY: Past Surgical History:  Procedure Laterality Date  . CATARACT EXTRACTION Left 06/10/2018   Dr. Valetta Close  . FRACTURE SURGERY  01/2003   Left Wrist  . LAMINECTOMY  1993   L3-5  . NEPHRECTOMY  10/1985  . SHOULDER OPEN ROTATOR CUFF REPAIR  12/1998   Right  . SHOULDER OPEN ROTATOR CUFF REPAIR  08/2001   Left    FAMILY HISTORY:  Family History  Problem Relation Age of Onset  . Heart disease Father   . Cancer Father        lung and prostate metastases  . Stroke Father        mini strokes  . Prostate cancer Father   . Cancer Mother        breast  . Stroke Mother        mets stroke  84  . Fibromyalgia Mother   . Arthritis Mother   . Lupus Sister   . Esophageal cancer Paternal Aunt   . Colon cancer Neg Hx     SOCIAL HISTORY:  Social History   Socioeconomic History  . Marital status: Married    Spouse name: Not on file  . Number of children: 2  . Years of  education: Not on file  . Highest education level: Not on file  Occupational History  . Occupation: Retired  Scientific laboratory technician  . Financial resource strain: Not on file  . Food insecurity    Worry: Not on file    Inability: Not on file  . Transportation needs    Medical: Not on file    Non-medical: Not on file  Tobacco Use  . Smoking status: Never Smoker  . Smokeless tobacco: Never Used  Substance and Sexual Activity  . Alcohol use: No    Alcohol/week: 0.0 standard drinks  . Drug use: No  . Sexual activity: Yes  Lifestyle  . Physical activity    Days per week: Not on file    Minutes per session: Not on file  . Stress: Not on file  Relationships  . Social Herbalist on phone: Not on file    Gets together: Not on file    Attends religious service: Not on file    Active member of club or  organization: Not on file    Attends meetings of clubs or organizations: Not on file    Relationship status: Not on file  . Intimate partner violence    Fear of current or ex partner: Not on file    Emotionally abused: Not on file    Physically abused: Not on file    Forced sexual activity: Not on file  Other Topics Concern  . Not on file  Social History Narrative   Retired from [employer], Dealer   Marital Status: Married 02/27/61, widowed 02-27-2017.  Wife died from hodgkin's lymphoma   Remarried 05/2019.     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 aspirin 81 MG tablet Take 81 mg by mouth daily.      . Calcium 500-125 MG-UNIT TABS Take 1 tablet by mouth daily.      . calcium carbonate (TUMS - DOSED IN MG ELEMENTAL CALCIUM) 500 MG chewable tablet Chew 1 tablet by mouth daily as needed for indigestion or heartburn.    . Cholecalciferol (VITAMIN D) 1000 UNITS capsule Take 1,000 Units by mouth daily.      . diphenhydrAMINE (BENADRYL) 25 MG tablet Take 25 mg by mouth as needed.      . fish oil-omega-3 fatty acids 1000 MG capsule Take 1 g by mouth daily.     . fluticasone (FLONASE) 50 MCG/ACT nasal spray Place 2 sprays into both nostrils daily.    Marland Kitchen loratadine (CLARITIN) 10 MG tablet Take 10 mg by mouth daily.     . metoprolol tartrate (LOPRESSOR) 25 MG tablet Take 0.5-1 tablets (12.5-25 mg total) by mouth 2 (two) times daily as needed. 180 tablet 3  . Multiple Vitamin (MULTIVITAMIN) capsule Take 1 capsule by mouth daily.      . sildenafil (REVATIO) 20 MG tablet TAKE 3 TO 5 TABLETS BY MOUTH ONCE DAILY AS NEEDED 50 tablet prn  . simvastatin (ZOCOR) 40 MG tablet Take 1 tablet (40 mg total) by mouth every evening. 90 tablet 3   No current facility-administered medications for  this encounter.     REVIEW OF SYSTEMS:  On review of systems, the patient reports that he is doing well overall. He denies any chest pain, shortness of breath, cough, fevers,  chills, night sweats, unintended weight changes. He denies any bowel disturbances, and denies abdominal pain, nausea or vomiting. He denies any new musculoskeletal or joint aches or pains. His IPSS was 13, indicating moderate urinary symptoms. His SHIM was 20, indicating he does not have erectile dysfunction. A complete review of systems is obtained and is otherwise negative.  PHYSICAL EXAM:  Wt Readings from Last 3 Encounters:  09/13/19 133 lb (60.3 kg)  09/06/19 133 lb 4 oz (60.4 kg)  09/09/18 134 lb 4 oz (60.9 kg)   Temp Readings from Last 3 Encounters:  09/06/19 97.8 F (36.6 C)  09/09/18 98.7 F (37.1 C) (Oral)  08/31/18 98.4 F (36.9 C) (Oral)   BP Readings from Last 3 Encounters:  09/06/19 (!) 142/70  09/09/18 104/68  08/31/18 116/68   Pulse Readings from Last 3 Encounters:  09/06/19 72  09/09/18 (!) 103  08/31/18 90   Pain Assessment Pain Score: 0-No pain/10  Physical exam not performed in light of telephone encounter.  KPS = 90  100 - Normal; no complaints; no evidence of disease. 90   - Able to carry on normal activity; minor signs or symptoms of disease. 80   - Normal activity with effort; some signs or symptoms of disease. 5   - Cares for self; unable to carry on normal activity or to do active work. 60   - Requires occasional assistance, but is able to care for most of his personal needs. 50   - Requires considerable assistance and frequent medical care. 26   - Disabled; requires special care and assistance. 63   - Severely disabled; hospital admission is indicated although death not imminent. 3   - Very sick; hospital admission necessary; active supportive treatment necessary. 10   - Moribund; fatal processes progressing rapidly. 0     - Dead  Karnofsky DA, Abelmann [place], Craver LS  and Burchenal [facility] [phone]) The use of the nitrogen mustards in the palliative treatment of carcinoma: with particular reference to bronchogenic carcinoma Cancer 1 634-56  LABORATORY DATA:  Lab Results  Component Value Date   WBC 7.2 10/26/2017   HGB 14.1 10/26/2017   HCT 43.5 10/26/2017   MCV 98.6 10/26/2017   PLT 253.0 10/26/2017   Lab Results  Component Value Date   NA 141 09/01/2019   K 4.5 09/01/2019   CL 105 09/01/2019   CO2 29 09/01/2019   Lab Results  Component Value Date   ALT 20 09/01/2019   AST 18 09/01/2019   ALKPHOS 56 09/01/2019   BILITOT 0.8 09/01/2019     RADIOGRAPHY: No results found.    IMPRESSION/PLAN:  This visit was conducted via Telephone to spare the patient unnecessary potential exposure in the healthcare setting during the current COVID-19 pandemic.  1. 83 y.o. gentleman with Stage T1c adenocarcinoma of the prostate with Gleason Score of 4+3, and PSA of 5.9.  We discussed the patient's workup and outlined the nature of prostate cancer in this setting. The patient's T stage, Gleason's score, and PSA put him into the unfavorable intermediate risk group. Accordingly, he is eligible for a variety of potential treatment options including brachytherapy or 5.5 weeks of external radiation. We reviewed the available radiation techniques, and focused on the details of logistics and delivery. We discussed and outlined the risks, benefits, short and long-term effects associated with radiotherapy. We also discussed the role of SpaceOAR in reducing the rectal toxicity associated with radiotherapy.  He was encouraged to ask questions that were  answered to his stated satisfaction.  At the end of the conversation the patient appears to have a good understanding of his disease and our recommendations for treatment of curative intent.  He is interested in moving forward with brachytherapy and use of SpaceOAR to reduce rectal toxicity from radiotherapy.  We will share our  discussion with Dr. Diona Fanti and move forward with scheduling his [facility] planning appointment in the near future.  The patient will be contacted by Romie Jumper in our office who will be working closely with him to coordinate OR scheduling and pre and post procedure appointments.  We will contact the pharmaceutical rep to ensure that [employer] is available at the time of procedure.  He will have a prostate MRI following his post-seed CT SIM to confirm appropriate distribution of the [place].  Given current concerns for patient exposure during the COVID-19 pandemic, this encounter was conducted via telephone. The patient was notified in advance and was offered a MyChart meeting to allow for face to face communication but unfortunately reported that he did not have the appropriate resources/technology to support such a visit and instead preferred to proceed with telephone consult. The patient has given verbal consent for this type of encounter. The time spent during this encounter was 55 minutes. The attendants for this meeting include Tyler Pita MD, Ashlyn Bruning PA-C, [place], patient, ALLEN HAASCH and his wife. During the encounter, Tyler Pita MD, Ashlyn Bruning PA-C, and scribe, Wilburn Mylar were located at [place].  Patient, Kenneth Ross and his wife were located at home.    [name], PA-C    Tyler Pita, MD  [facility] Direct Dial: [phone]  Fax: [phone] [email or web address]  Skype  [email or web address]  This document serves as a record of services personally performed by Tyler Pita, MD and Freeman Caldron, PA-C. It was created on their behalf by Wilburn Mylar, a trained medical scribe. The creation of this record is based on the scribe's personal observations and the provider's statements to them. This document has been checked and approved by the attending  provider.

## 2019-09-14 ENCOUNTER — Encounter: Payer: Self-pay | Admitting: Family Medicine

## 2019-09-15 ENCOUNTER — Other Ambulatory Visit: Payer: Self-pay | Admitting: Urology

## 2019-09-15 ENCOUNTER — Telehealth: Payer: Self-pay | Admitting: *Deleted

## 2019-09-15 NOTE — Telephone Encounter (Signed)
CALLED PATIENT TO INFORM OF IMPLANT DATE, SPOKE WITH PATIENT AND HE IS AWARE OF THIS PROCEDURE DATE AND TIME

## 2019-09-21 ENCOUNTER — Telehealth: Payer: Self-pay | Admitting: *Deleted

## 2019-09-21 NOTE — Telephone Encounter (Signed)
xxxx 

## 2019-09-21 NOTE — Telephone Encounter (Signed)
CALLED PATIENT TO REMIND OF PRE-SEED APPTS. FOR 09-22-19, SPOKE WITH PATIENT AND HE IS AWARE OF THESE APPTS.

## 2019-09-22 ENCOUNTER — Other Ambulatory Visit: Payer: Self-pay | Admitting: Urology

## 2019-09-22 ENCOUNTER — Other Ambulatory Visit: Payer: Self-pay

## 2019-09-22 ENCOUNTER — Ambulatory Visit
Admission: RE | Admit: 2019-09-22 | Discharge: 2019-09-22 | Disposition: A | Discharge status: Home / Self Care | Payer: PPO | Source: Ambulatory Visit | Attending: Radiation Oncology | Admitting: Radiation Oncology

## 2019-09-22 ENCOUNTER — Ambulatory Visit
Admission: RE | Admit: 2019-09-22 | Discharge: 2019-09-22 | Disposition: A | Discharge status: Home / Self Care | Payer: PPO | Source: Ambulatory Visit | Attending: Urology | Admitting: Urology

## 2019-09-22 ENCOUNTER — Ambulatory Visit (HOSPITAL_COMMUNITY)
Admission: RE | Admit: 2019-09-22 | Discharge: 2019-09-22 | Disposition: A | Discharge status: Home / Self Care | Payer: PPO | Source: Ambulatory Visit | Attending: Urology | Admitting: Urology

## 2019-09-22 ENCOUNTER — Encounter (HOSPITAL_COMMUNITY)
Admission: RE | Admit: 2019-09-22 | Discharge: 2019-09-22 | Disposition: A | Discharge status: Home / Self Care | Payer: PPO | Source: Ambulatory Visit | Attending: Urology | Admitting: Urology

## 2019-09-22 ENCOUNTER — Encounter: Payer: Self-pay | Admitting: Medical Oncology

## 2019-09-22 DIAGNOSIS — Z01818 Encounter for other preprocedural examination: Secondary | ICD-10-CM | POA: Insufficient documentation

## 2019-09-22 DIAGNOSIS — C61 Malignant neoplasm of prostate: Secondary | ICD-10-CM

## 2019-09-25 NOTE — Progress Notes (Signed)
  Radiation Oncology         [phone] ________________________________  Name: Kenneth Ross MRN: [id number]  Date: 09/22/2019  DOB: 07-04-1936  SIMULATION AND TREATMENT PLANNING NOTE PUBIC ARCH STUDY  [id number], [name], MD  Tonia Ghent, MD  DIAGNOSIS: 83 y.o. gentleman with Stage T1c adenocarcinoma of the prostate with Gleason score of 4+3, and PSA of 5.9.     ICD-10-CM   1. Prostate cancer ([place])  C61     COMPLEX SIMULATION:  The patient presented today for evaluation for possible prostate seed implant. He was brought to the radiation planning suite and placed supine on the CT couch. A 3-dimensional image study set was obtained in upload to the planning computer. There, on each axial slice, I contoured the prostate gland. Then, using three-dimensional radiation planning tools I reconstructed the prostate in view of the structures from the transperineal needle pathway to assess for possible pubic arch interference. In doing so, I did not appreciate any pubic arch interference. Also, the patient's prostate volume was estimated based on the drawn structure. The volume was 42 cc.  Given the pubic arch appearance and prostate volume, patient remains a good candidate to proceed with prostate seed implant. Today, he freely provided informed written consent to proceed.    PLAN: The patient will undergo prostate seed implant.   ________________________________  [name]. [name], M.D.

## 2019-10-12 DIAGNOSIS — C61 Malignant neoplasm of prostate: Secondary | ICD-10-CM | POA: Diagnosis not present

## 2019-10-14 ENCOUNTER — Telehealth: Payer: Self-pay | Admitting: *Deleted

## 2019-10-14 NOTE — Telephone Encounter (Signed)
Returned patient's phone call, spoke with patient 

## 2019-10-17 ENCOUNTER — Telehealth: Payer: Self-pay | Admitting: *Deleted

## 2019-10-17 NOTE — Telephone Encounter (Signed)
Called patient to remind of lab and COVID testing for 10-18-19, lvm for a return call

## 2019-10-18 ENCOUNTER — Other Ambulatory Visit (HOSPITAL_COMMUNITY)
Admission: RE | Admit: 2019-10-18 | Discharge: 2019-10-18 | Disposition: A | Discharge status: Home / Self Care | Payer: PPO | Source: Ambulatory Visit | Attending: Urology | Admitting: Urology

## 2019-10-18 ENCOUNTER — Other Ambulatory Visit: Payer: Self-pay

## 2019-10-18 ENCOUNTER — Encounter (HOSPITAL_COMMUNITY)
Admission: RE | Admit: 2019-10-18 | Discharge: 2019-10-18 | Disposition: A | Discharge status: Home / Self Care | Payer: PPO | Source: Ambulatory Visit | Attending: Urology | Admitting: Urology

## 2019-10-18 DIAGNOSIS — C61 Malignant neoplasm of prostate: Secondary | ICD-10-CM | POA: Diagnosis not present

## 2019-10-18 DIAGNOSIS — Z01812 Encounter for preprocedural laboratory examination: Secondary | ICD-10-CM | POA: Insufficient documentation

## 2019-10-18 DIAGNOSIS — Z20828 Contact with and (suspected) exposure to other viral communicable diseases: Secondary | ICD-10-CM | POA: Diagnosis not present

## 2019-10-18 DIAGNOSIS — Z7982 Long term (current) use of aspirin: Secondary | ICD-10-CM | POA: Diagnosis not present

## 2019-10-18 DIAGNOSIS — E785 Hyperlipidemia, unspecified: Secondary | ICD-10-CM | POA: Insufficient documentation

## 2019-10-18 DIAGNOSIS — Z79899 Other long term (current) drug therapy: Secondary | ICD-10-CM | POA: Insufficient documentation

## 2019-10-18 DIAGNOSIS — G4730 Sleep apnea, unspecified: Secondary | ICD-10-CM | POA: Insufficient documentation

## 2019-10-18 LAB — COMPREHENSIVE METABOLIC PANEL
ALT: 21 U/L (ref 0–44)
AST: 20 U/L (ref 15–41)
Albumin: 4.5 g/dL (ref 3.5–5.0)
Alkaline Phosphatase: 55 U/L (ref 38–126)
Anion gap: 8 (ref 5–15)
BUN: 22 mg/dL (ref 8–23)
CO2: 27 mmol/L (ref 22–32)
Calcium: 9 mg/dL (ref 8.9–10.3)
Chloride: 108 mmol/L (ref 98–111)
Creatinine, Ser: 1.6 mg/dL — ABNORMAL HIGH (ref 0.61–1.24)
GFR calc Af Amer: 46 mL/min — ABNORMAL LOW (ref >60)
GFR calc non Af Amer: 40 mL/min — ABNORMAL LOW (ref >60)
Glucose, Bld: 102 mg/dL — ABNORMAL HIGH (ref 70–99)
Potassium: 4.1 mmol/L (ref 3.5–5.1)
Sodium: 143 mmol/L (ref 135–145)
Total Bilirubin: 1 mg/dL (ref 0.3–1.2)
Total Protein: 6.7 g/dL (ref 6.5–8.1)

## 2019-10-18 LAB — CBC
HCT: 40.9 % (ref 39.0–52.0)
Hemoglobin: 13.3 g/dL (ref 13.0–17.0)
MCH: 32.2 pg (ref 26.0–34.0)
MCHC: 32.5 g/dL (ref 30.0–36.0)
MCV: 99 fL (ref 80.0–100.0)
Platelets: 249 10*3/uL (ref 150–400)
RBC: 4.13 MIL/uL — ABNORMAL LOW (ref 4.22–5.81)
RDW: 14 % (ref 11.5–15.5)
WBC: 5.8 10*3/uL (ref 4.0–10.5)
nRBC: 0 % (ref 0.0–0.2)

## 2019-10-18 LAB — PROTIME-INR
INR: 1.2 (ref 0.8–1.2)
Prothrombin Time: 14.9 seconds (ref 11.4–15.2)

## 2019-10-18 LAB — APTT: aPTT: 32 seconds (ref 24–36)

## 2019-10-19 ENCOUNTER — Telehealth: Payer: Self-pay | Admitting: *Deleted

## 2019-10-19 ENCOUNTER — Encounter (HOSPITAL_BASED_OUTPATIENT_CLINIC_OR_DEPARTMENT_OTHER): Payer: Self-pay | Admitting: *Deleted

## 2019-10-19 LAB — NOVEL CORONAVIRUS, NAA (HOSP ORDER, SEND-OUT TO REF LAB; TAT 18-24 HRS): SARS-CoV-2, NAA: NOT DETECTED

## 2019-10-19 NOTE — Telephone Encounter (Signed)
Called patient to remind of procedure for 10-21-19, spoke with patient and he is aware of this procedure.

## 2019-10-20 ENCOUNTER — Other Ambulatory Visit: Payer: Self-pay

## 2019-10-20 ENCOUNTER — Encounter (HOSPITAL_BASED_OUTPATIENT_CLINIC_OR_DEPARTMENT_OTHER): Payer: Self-pay | Admitting: *Deleted

## 2019-10-20 NOTE — Progress Notes (Signed)
Spoke w/ via phone for pre-op interview--- PT Lab needs dos----   None            Lab results------  CBC, CMP, PT/PTT, CXR, EKG in chart/ epic COVID test ------ 10-18-2019 Arrive at ------- 0930 NPO after ------  MN Medications to take morning of surgery ----- Claritin w/ sips fo water Diabetic medication ----- n/a Patient Special Instructions -----  Do one fleet enema morning of surgery Pre-Op special Istructions ----- n/a Patient verbalized understanding of instructions that were given at this phone interview. Patient denies shortness of breath, chest pain, fever, cough a this phone interview.

## 2019-10-21 ENCOUNTER — Ambulatory Visit (HOSPITAL_COMMUNITY): Payer: PPO

## 2019-10-21 ENCOUNTER — Encounter (HOSPITAL_BASED_OUTPATIENT_CLINIC_OR_DEPARTMENT_OTHER): Payer: Self-pay | Admitting: *Deleted

## 2019-10-21 ENCOUNTER — Ambulatory Visit (HOSPITAL_BASED_OUTPATIENT_CLINIC_OR_DEPARTMENT_OTHER)
Admission: RE | Admit: 2019-10-21 | Discharge: 2019-10-21 | Disposition: A | Discharge status: Home / Self Care | Payer: PPO | Source: Ambulatory Visit | Attending: Urology | Admitting: Urology

## 2019-10-21 ENCOUNTER — Ambulatory Visit (HOSPITAL_BASED_OUTPATIENT_CLINIC_OR_DEPARTMENT_OTHER): Payer: PPO | Admitting: Anesthesiology

## 2019-10-21 ENCOUNTER — Encounter (HOSPITAL_BASED_OUTPATIENT_CLINIC_OR_DEPARTMENT_OTHER)
Admission: RE | Disposition: A | Discharge status: Home / Self Care | Payer: Self-pay | Source: Ambulatory Visit | Attending: Urology

## 2019-10-21 ENCOUNTER — Other Ambulatory Visit: Payer: Self-pay

## 2019-10-21 DIAGNOSIS — Z01818 Encounter for other preprocedural examination: Secondary | ICD-10-CM

## 2019-10-21 DIAGNOSIS — N183 Chronic kidney disease, stage 3 unspecified: Secondary | ICD-10-CM | POA: Diagnosis not present

## 2019-10-21 DIAGNOSIS — R000 Tachycardia, unspecified: Secondary | ICD-10-CM | POA: Insufficient documentation

## 2019-10-21 DIAGNOSIS — K219 Gastro-esophageal reflux disease without esophagitis: Secondary | ICD-10-CM | POA: Insufficient documentation

## 2019-10-21 DIAGNOSIS — Z91041 Radiographic dye allergy status: Secondary | ICD-10-CM | POA: Diagnosis not present

## 2019-10-21 DIAGNOSIS — Z905 Acquired absence of kidney: Secondary | ICD-10-CM | POA: Diagnosis not present

## 2019-10-21 DIAGNOSIS — E7800 Pure hypercholesterolemia, unspecified: Secondary | ICD-10-CM | POA: Diagnosis not present

## 2019-10-21 DIAGNOSIS — Z85528 Personal history of other malignant neoplasm of kidney: Secondary | ICD-10-CM | POA: Insufficient documentation

## 2019-10-21 DIAGNOSIS — E785 Hyperlipidemia, unspecified: Secondary | ICD-10-CM | POA: Diagnosis not present

## 2019-10-21 DIAGNOSIS — C61 Malignant neoplasm of prostate: Secondary | ICD-10-CM | POA: Insufficient documentation

## 2019-10-21 DIAGNOSIS — Z79899 Other long term (current) drug therapy: Secondary | ICD-10-CM | POA: Diagnosis not present

## 2019-10-21 DIAGNOSIS — Z8042 Family history of malignant neoplasm of prostate: Secondary | ICD-10-CM | POA: Diagnosis not present

## 2019-10-21 HISTORY — DX: Reserved for concepts with insufficient information to code with codable children: IMO0002

## 2019-10-21 HISTORY — DX: Male erectile dysfunction, unspecified: N52.9

## 2019-10-21 HISTORY — PX: SPACE OAR INSTILLATION: SHX6769

## 2019-10-21 HISTORY — DX: Tachycardia, unspecified: R00.0

## 2019-10-21 HISTORY — DX: Gastro-esophageal reflux disease without esophagitis: K21.9

## 2019-10-21 HISTORY — DX: Other seasonal allergic rhinitis: J30.2

## 2019-10-21 HISTORY — DX: Personal history of other malignant neoplasm of kidney: Z85.528

## 2019-10-21 HISTORY — DX: Other idiopathic scoliosis, site unspecified: M41.20

## 2019-10-21 HISTORY — DX: Chronic kidney disease, stage 3 unspecified: N18.30

## 2019-10-21 HISTORY — PX: RADIOACTIVE SEED IMPLANT: SHX5150

## 2019-10-21 SURGERY — INSERTION, RADIATION SOURCE, PROSTATE
Anesthesia: General | Site: Prostate

## 2019-10-21 MED ORDER — KETOROLAC TROMETHAMINE 30 MG/ML IJ SOLN
INTRAMUSCULAR | Status: AC
  Filled 2019-10-21: qty 1

## 2019-10-21 MED ORDER — SODIUM CHLORIDE (PF) 0.9 % IJ SOLN
INTRAMUSCULAR | Status: DC | PRN
  Administered 2019-10-21: 3 mL
  Administered 2019-10-21: 10 mL

## 2019-10-21 MED ORDER — ONDANSETRON HCL 4 MG/2ML IJ SOLN
INTRAMUSCULAR | Status: AC
  Filled 2019-10-21: qty 2

## 2019-10-21 MED ORDER — KETOROLAC TROMETHAMINE 30 MG/ML IJ SOLN
INTRAMUSCULAR | Status: DC | PRN
  Administered 2019-10-21: 30 mg via INTRAVENOUS

## 2019-10-21 MED ORDER — FLEET ENEMA 7-19 GM/118ML RE ENEM
1.0000 | ENEMA | Freq: Once | RECTAL | Status: AC
  Administered 2019-10-21: 1 via RECTAL
  Filled 2019-10-21: qty 1

## 2019-10-21 MED ORDER — PHENYLEPHRINE HCL (PRESSORS) 10 MG/ML IV SOLN
INTRAVENOUS | Status: DC | PRN
  Administered 2019-10-21 (×3): 80 ug via INTRAVENOUS

## 2019-10-21 MED ORDER — FENTANYL CITRATE (PF) 100 MCG/2ML IJ SOLN
25.0000 ug | INTRAMUSCULAR | Status: DC | PRN
  Filled 2019-10-21: qty 1

## 2019-10-21 MED ORDER — DEXAMETHASONE SODIUM PHOSPHATE 10 MG/ML IJ SOLN
INTRAMUSCULAR | Status: DC | PRN
  Administered 2019-10-21: 5 mg via INTRAVENOUS

## 2019-10-21 MED ORDER — EPHEDRINE 5 MG/ML INJ
INTRAVENOUS | Status: AC
  Filled 2019-10-21: qty 10

## 2019-10-21 MED ORDER — ONDANSETRON HCL 4 MG/2ML IJ SOLN
INTRAMUSCULAR | Status: DC | PRN
  Administered 2019-10-21: 4 mg via INTRAVENOUS

## 2019-10-21 MED ORDER — LACTATED RINGERS IV SOLN
INTRAVENOUS | Status: DC
  Administered 2019-10-21: 1000 mL via INTRAVENOUS
  Administered 2019-10-21: 10:00:00 via INTRAVENOUS
  Filled 2019-10-21: qty 1000

## 2019-10-21 MED ORDER — OXYCODONE HCL 5 MG/5ML PO SOLN
5.0000 mg | Freq: Once | ORAL | Status: DC | PRN
  Filled 2019-10-21: qty 5

## 2019-10-21 MED ORDER — CEFAZOLIN SODIUM-DEXTROSE 2-4 GM/100ML-% IV SOLN
2.0000 g | Freq: Once | INTRAVENOUS | Status: AC
  Administered 2019-10-21: 2 g via INTRAVENOUS
  Filled 2019-10-21: qty 100

## 2019-10-21 MED ORDER — DEXAMETHASONE SODIUM PHOSPHATE 10 MG/ML IJ SOLN
INTRAMUSCULAR | Status: AC
  Filled 2019-10-21: qty 1

## 2019-10-21 MED ORDER — PROMETHAZINE HCL 25 MG/ML IJ SOLN
6.2500 mg | INTRAMUSCULAR | Status: DC | PRN
  Filled 2019-10-21: qty 1

## 2019-10-21 MED ORDER — SODIUM CHLORIDE 0.9 % IV SOLN
INTRAVENOUS | Status: AC | PRN
  Administered 2019-10-21: 1000 mL

## 2019-10-21 MED ORDER — ARTIFICIAL TEARS OPHTHALMIC OINT
TOPICAL_OINTMENT | OPHTHALMIC | Status: AC
  Filled 2019-10-21: qty 3.5

## 2019-10-21 MED ORDER — LIDOCAINE 2% (20 MG/ML) 5 ML SYRINGE
INTRAMUSCULAR | Status: AC
  Filled 2019-10-21: qty 5

## 2019-10-21 MED ORDER — PROPOFOL 10 MG/ML IV BOLUS
INTRAVENOUS | Status: DC | PRN
  Administered 2019-10-21: 150 mg via INTRAVENOUS

## 2019-10-21 MED ORDER — FENTANYL CITRATE (PF) 100 MCG/2ML IJ SOLN
INTRAMUSCULAR | Status: DC | PRN
  Administered 2019-10-21 (×2): 25 ug via INTRAVENOUS
  Administered 2019-10-21: 50 ug via INTRAVENOUS

## 2019-10-21 MED ORDER — FENTANYL CITRATE (PF) 100 MCG/2ML IJ SOLN
INTRAMUSCULAR | Status: AC
  Filled 2019-10-21: qty 2

## 2019-10-21 MED ORDER — LIDOCAINE 2% (20 MG/ML) 5 ML SYRINGE
INTRAMUSCULAR | Status: DC | PRN
  Administered 2019-10-21: 60 mg via INTRAVENOUS

## 2019-10-21 MED ORDER — IOHEXOL 300 MG/ML  SOLN
INTRAMUSCULAR | Status: DC | PRN
  Administered 2019-10-21: 7 mL

## 2019-10-21 MED ORDER — ACETAMINOPHEN 10 MG/ML IV SOLN
1000.0000 mg | Freq: Once | INTRAVENOUS | Status: DC | PRN
  Filled 2019-10-21: qty 100

## 2019-10-21 MED ORDER — CEFAZOLIN SODIUM-DEXTROSE 2-4 GM/100ML-% IV SOLN
INTRAVENOUS | Status: AC
  Filled 2019-10-21: qty 100

## 2019-10-21 MED ORDER — OXYCODONE HCL 5 MG PO TABS
5.0000 mg | ORAL_TABLET | Freq: Once | ORAL | Status: DC | PRN
  Filled 2019-10-21: qty 1

## 2019-10-21 SURGICAL SUPPLY — 31 items
BAG DRN RND TRDRP ANRFLXCHMBR (UROLOGICAL SUPPLIES) ×2
BAG URINE DRAIN 2000ML AR STRL (UROLOGICAL SUPPLIES) ×3 IMPLANT
BLADE CLIPPER SENSICLIP SURGIC (BLADE) ×3 IMPLANT
CATH FOLEY 2WAY SLVR  5CC 16FR (CATHETERS) ×1
CATH FOLEY 2WAY SLVR 5CC 16FR (CATHETERS) ×2 IMPLANT
CATH ROBINSON RED A/P 16FR (CATHETERS) ×1 IMPLANT
CATH ROBINSON RED A/P 20FR (CATHETERS) ×3 IMPLANT
CLOTH BEACON ORANGE TIMEOUT ST (SAFETY) ×3 IMPLANT
CONT SPEC 4OZ CLIKSEAL STRL BL (MISCELLANEOUS) ×6 IMPLANT
COVER BACK TABLE 60X90IN (DRAPES) ×3 IMPLANT
COVER MAYO STAND STRL (DRAPES) ×3 IMPLANT
DRSG TEGADERM 4X4.75 (GAUZE/BANDAGES/DRESSINGS) ×5 IMPLANT
DRSG TEGADERM 8X12 (GAUZE/BANDAGES/DRESSINGS) ×5 IMPLANT
GAUZE SPONGE 4X4 12PLY STRL (GAUZE/BANDAGES/DRESSINGS) ×1 IMPLANT
GLOVE BIO SURGEON STRL SZ8 (GLOVE) ×5 IMPLANT
GLOVE SURG ORTHO 8.5 STRL (GLOVE) ×3 IMPLANT
GLOVE SURG SS PI 6.5 STRL IVOR (GLOVE) ×1 IMPLANT
GLOVE SURG SS PI 8.5 STRL IVOR (GLOVE) ×2
GLOVE SURG SS PI 8.5 STRL STRW (GLOVE) IMPLANT
GOWN STRL REUS W/TWL LRG LVL3 (GOWN DISPOSABLE) ×1 IMPLANT
GOWN STRL REUS W/TWL XL LVL3 (GOWN DISPOSABLE) ×4 IMPLANT
I-Seed AgX100 ×65 IMPLANT
IMPL SPACEOAR SYSTEM 10ML (Spacer) ×2 IMPLANT
IMPLANT SPACEOAR SYSTEM 10ML (Spacer) ×3 IMPLANT
KIT TURNOVER CYSTO (KITS) ×3 IMPLANT
MARKER SKIN DUAL TIP RULER LAB (MISCELLANEOUS) ×3 IMPLANT
PACK CYSTO (CUSTOM PROCEDURE TRAY) ×3 IMPLANT
SYR 10ML LL (SYRINGE) ×4 IMPLANT
TOWEL OR 17X26 10 PK STRL BLUE (TOWEL DISPOSABLE) ×3 IMPLANT
UNDERPAD 30X30 (UNDERPADS AND DIAPERS) ×6 IMPLANT
WATER STERILE IRR 500ML POUR (IV SOLUTION) ×3 IMPLANT

## 2019-10-21 NOTE — Anesthesia Postprocedure Evaluation (Signed)
Anesthesia Post Note  Patient: Kenneth Ross  Procedure(s) Performed: RADIOACTIVE SEED IMPLANT/BRACHYTHERAPY IMPLANT (N/A Prostate) SPACE OAR INSTILLATION (N/A Perineum)     Patient location during evaluation: PACU Anesthesia Type: General Level of consciousness: awake and alert and oriented Pain management: pain level controlled Vital Signs Assessment: post-procedure vital signs reviewed and stable Respiratory status: spontaneous breathing, nonlabored ventilation and respiratory function stable Cardiovascular status: blood pressure returned to baseline and stable Postop Assessment: no apparent nausea or vomiting Anesthetic complications: no    Last Vitals:  Vitals:   10/21/19 1330 10/21/19 1345  BP: 127/68 124/70  Pulse: 93 88  Resp: 13 13  Temp:    SpO2: 100% 100%    Last Pain:  Vitals:   10/21/19 1345  TempSrc:   PainSc: 0-No pain                 Maysin Carstens A.

## 2019-10-21 NOTE — Discharge Instructions (Signed)
Radioactive Seed Implant Home Care Instructions   Activity:    Rest for the remainder of the day.  Do not drive or operate equipment today.  You may resume normal  activities in a few days as instructed by your physician, without risk of harmful radiation exposure to those around you, provided you follow the time and distance precautions on the Radiation Oncology Instruction Sheet.   Meals: Drink plenty of lipuids and eat light foods, such as gelatin or soup this evening .  You may return to normal meal plan tomorrow.  Return To Work: You may return to work as instructed by your physician.  Special Instruction:   If any seeds are found, use tweezers to pick up seeds and place in a glass container of any kind and bring to your physician's office.  Call your physician if any of these symptoms occur:   Persistent or heavy bleeding  Urine stream diminishes or stops completely after catheter is removed  Fever equal to or greater than 101 degrees F  Cloudy urine with a strong foul odor  Severe pain  You may feel some burning pain and/or hesitancy when you urinate after the catheter is removed.  These symptoms may increase over the next few weeks, but should diminish within forur to six weeks.  Applying moist heat to the lower abdomen or a hot tub bath may help relieve the pain.  If the discomfort becomes severe, please call your physician for additional medications.     Post Anesthesia Home Care Instructions  Activity: Get plenty of rest for the remainder of the day. A responsible individual must stay with you for 24 hours following the procedure.  For the next 24 hours, DO NOT: -Drive a car -Operate machinery -Drink alcoholic beverages -Take any medication unless instructed by your physician -Make any legal decisions or sign important papers.  Meals: Start with liquid foods such as gelatin or soup. Progress to regular foods as tolerated. Avoid greasy, spicy, heavy foods. If  nausea and/or vomiting occur, drink only clear liquids until the nausea and/or vomiting subsides. Call your physician if vomiting continues.  Special Instructions/Symptoms: Your throat may feel dry or sore from the anesthesia or the breathing tube placed in your throat during surgery. If this causes discomfort, gargle with warm salt water. The discomfort should disappear within 24 hours.  If you had a scopolamine patch placed behind your ear for the management of post- operative nausea and/or vomiting:  1. The medication in the patch is effective for 72 hours, after which it should be removed.  Wrap patch in a tissue and discard in the trash. Wash hands thoroughly with soap and water. 2. You may remove the patch earlier than 72 hours if you experience unpleasant side effects which may include dry mouth, dizziness or visual disturbances. 3. Avoid touching the patch. Wash your hands with soap and water after contact with the patch.     

## 2019-10-21 NOTE — Transfer of Care (Signed)
Immediate Anesthesia Transfer of Care Note  Patient: Kenneth Ross  Procedure(s) Performed: 63 RADIOACTIVE SEED IMPLANT/BRACHYTHERAPY IMPLANT (N/A Prostate) SPACE OAR INSTILLATION (N/A Perineum)  Patient Location: PACU  Anesthesia Type:General  Level of Consciousness: sedated and patient cooperative  Airway & Oxygen Therapy: Patient Spontanous Breathing and Patient connected to nasal cannula oxygen  Post-op Assessment: Report given to RN and Post -op Vital signs reviewed and stable  Post vital signs: Reviewed and stable  Last Vitals:  Vitals Value Taken Time  BP 123/68 10/21/19 1308  Temp    Pulse 84 10/21/19 1309  Resp 8 10/21/19 1309  SpO2 100 % 10/21/19 1309  Vitals shown include unvalidated device data.  Last Pain:  Vitals:   10/21/19 0959  TempSrc: Oral  PainSc: 0-No pain      Patients Stated Pain Goal: 5 ([id number])  Complications: No apparent anesthesia complications

## 2019-10-21 NOTE — Anesthesia Preprocedure Evaluation (Addendum)
Anesthesia Evaluation  Patient identified by MRN, date of birth, ID band Patient awake    Reviewed: Allergy & Precautions, NPO status , Patient's Chart, lab work & pertinent test results  Airway Mallampati: II  TM Distance: >3 FB Neck ROM: Full    Dental no notable dental hx. (+) Teeth Intact, Dental Advisory Given   Pulmonary neg pulmonary ROS,    Pulmonary exam normal breath sounds clear to auscultation       Cardiovascular Normal cardiovascular exam Rhythm:Regular Rate:Normal  H/O tachycardia   Neuro/Psych negative neurological ROS  negative psych ROS   GI/Hepatic Neg liver ROS, GERD  ,  Endo/Other  negative endocrine ROS  Renal/GU Renal InsufficiencyRenal disease  negative genitourinary   Musculoskeletal negative musculoskeletal ROS (+)   Abdominal   Peds negative pediatric ROS (+)  Hematology negative hematology ROS (+)   Anesthesia Other Findings   Reproductive/Obstetrics negative OB ROS                            Anesthesia Physical Anesthesia Plan  ASA: III  Anesthesia Plan: General   Post-op Pain Management:    Induction: Intravenous  PONV Risk Score and Plan: 2 and Ondansetron, Dexamethasone and Treatment may vary due to age or medical condition  Airway Management Planned: LMA  Additional Equipment:   Intra-op Plan:   Post-operative Plan: Extubation in OR  Informed Consent: I have reviewed the patients History and Physical, chart, labs and discussed the procedure including the risks, benefits and alternatives for the proposed anesthesia with the patient or authorized representative who has indicated his/her understanding and acceptance.     Dental advisory given  Plan Discussed with: CRNA and Surgeon  Anesthesia Plan Comments:         Anesthesia Quick Evaluation

## 2019-10-21 NOTE — Op Note (Signed)
Preoperative diagnosis: Clinical stage TI C adenocarcinoma the prostate   Postoperative diagnosis: Same   Procedure: I-125 prostate seed implantation, flexible cystoscopy  Surgeon: Lillette Boxer. Amandalee Lacap M.D.  Radiation Oncologist: Tyler Pita, M.D.  Anesthesia: Gen.   Indications: Patient  was diagnosed with clinical stage TI 24C prostate cancer. We had extensive discussion with him about treatment options versus. He elected to proceed with seed implantation. He underwent consultation my office as well as with Dr. [name]. He appeared to understand the advantages disadvantages potential risks of this treatment option. Full informed consent has been obtained.   Technique and findings: Patient was brought the operating room where he had successful induction of general anesthesia. He was placed in dorso-lithotomy position and prepped and draped in usual manner. Appropriate surgical timeout was performed. Radiation oncology department placed a transrectal ultrasound probe anchoring stand. Foley catheter with contrast in the balloon was inserted without difficulty. Anchoring needles were placed within the prostate. Rectal tube was placed. Real-time contouring of the urethra prostate and rectum were performed and the dosing parameters were established. Targeted dose was 145 gray.  I was then called  to the operating suite suite for placement of the needles. A second timeout was performed. All needle passage was done with real-time transrectal ultrasound guidance with the sagittal plane. A total of 24 needles were placed.  65 active seeds were implanted.  I then proceeded with placement of SpaceOAR by introducing a needle with the bevel angled inferiorly approximately 2 cm superior to the anus. This was angled downward and under direct ultrasound was placed within the space between the prostatic capsule and rectum. This was confirmed with a small amount of sterile saline injected and this was performed  under direct ultrasound. I then attached the SpaceOAR to the needle and injected this in the space between the prostate and rectum with good placement noted. The Foley catheter was removed and flexible cystoscopy failed to show any seeds outside the prostate.  The patient was brought to recovery room in stable condition, having tolerated the procedure well.Marland Kitchen

## 2019-10-21 NOTE — Anesthesia Procedure Notes (Signed)
Procedure Name: LMA Insertion Date/Time: 10/21/2019 11:54 AM Performed by: Wanita Chamberlain, CRNA Pre-anesthesia Checklist: Patient identified, Timeout performed, Emergency Drugs available and Suction available Patient Re-evaluated:Patient Re-evaluated prior to induction Oxygen Delivery Method: Circle system utilized Preoxygenation: Pre-oxygenation with 100% oxygen Induction Type: IV induction Ventilation: Mask ventilation without difficulty LMA: LMA inserted Number of attempts: 1 Placement Confirmation: breath sounds checked- equal and bilateral,  CO2 detector and positive ETCO2 Tube secured with: Tape Dental Injury: Teeth and Oropharynx as per pre-operative assessment

## 2019-10-21 NOTE — H&P (Signed)
H&P  Chief Complaint: PCa  History of Present Illness: 83 yo presents for I 1225 brachytherapy.  Past Medical History:  Diagnosis Date  . Allergic rhinitis, seasonal   . Bell's palsy    with L eyelid droop at baseline as of 2017  . CKD (chronic kidney disease), stage III    followed by pcp  . ED (erectile dysfunction)   . GERD (gastroesophageal reflux disease)    occasional take tums  . History of renal cell carcinoma    1986  s/p  left nephrectomy  . Hyperlipidemia   . Idiopathic scoliosis   . Prostate cancer [facility]) urologist-- dr Silvia Markuson/  oncologist-- dr [name]   dx 09-07-2019--- Stage T1c,  Gleason 4+3  . Seasonal allergies   . Solitary right kidney    s/p  left nephrecotmy 1986  . Tachycardia followed by pcp   HR 100 @ pcp office --- pt referred to cardiology for consult w/ dr t. Rockey Situ note in epic 12-14-2017, by pcp (dr Damita Dunnings) no work up done,  pt has lopressor as needed if heart rate is elevated as pt feels appropiate    Past Surgical History:  Procedure Laterality Date  . CATARACT EXTRACTION Left 06/10/2018   Dr. Valetta Close  . CATARACT EXTRACTION W/ INTRAOCULAR LENS IMPLANT Left   . LAMINECTOMY  1993   L3-5  . NEPHRECTOMY Left 10/1985  . SHOULDER OPEN ROTATOR CUFF REPAIR  12/1998   Right  . SHOULDER OPEN ROTATOR CUFF REPAIR Bilateral 08/2001    Home Medications:    Allergies:  Allergies  Allergen Reactions  . Iodinated Diagnostic Agents Nausea And Vomiting    IV dye    Family History  Problem Relation Age of Onset  . Heart disease Father   . Cancer Father        lung and prostate metastases  . Stroke Father        mini strokes  . Prostate cancer Father   . Cancer Mother        breast  . Stroke Mother        mets stroke  67  . Fibromyalgia Mother   . Arthritis Mother   . Lupus Sister   . Esophageal cancer Paternal Aunt   . Colon cancer Neg Hx     Social History:  reports that he has never smoked. He has never used smokeless tobacco. He  reports that he does not drink alcohol or use drugs.  ROS: A complete review of systems was performed.  All systems are negative except for pertinent findings as noted.  Physical Exam:  Vital signs in last 24 hours: Temp:  [97.9 F (36.6 C)] 97.9 F (36.6 C) (11/20 0959) Pulse Rate:  [81] 81 (11/20 0959) Resp:  [14] 14 (11/20 0959) BP: (128)/(75) 128/75 (11/20 0959) SpO2:  [100 %] 100 % (11/20 0959) Weight:  [60.4 kg-62.1 kg] 60.4 kg (11/20 0959) Constitutional:  Alert and oriented, No acute distress Cardiovascular: Regular rate  Respiratory: Normal respiratory effort GI: Abdomen is soft, nontender, nondistended, no abdominal masses. No CVAT.  Genitourinary: Normal male phallus, testes are descended bilaterally and non-tender and without masses, scrotum is normal in appearance without lesions or masses, perineum is normal on inspection. Lymphatic: No lymphadenopathy Neurologic: Grossly intact, no focal deficits Psychiatric: Normal mood and affect  Laboratory Data:  No results for input(s): WBC, HGB, HCT, PLT in the last 72 hours.  No results for input(s): NA, K, CL, GLUCOSE, BUN, CALCIUM, CREATININE in the last 72  hours.  Invalid input(s): CO3   No results found for this or any previous visit (from the past 24 hour(s)). Recent Results (from the past 240 hour(s))  Novel Coronavirus, NAA (Hosp order, Send-out to Ref Lab; TAT 18-24 hrs     Status: None   Collection Time: 10/18/19 12:22 PM   Specimen: Nasopharyngeal Swab; Respiratory  Result Value Ref Range Status   SARS-CoV-2, NAA NOT DETECTED NOT DETECTED Final    Comment: (NOTE) This nucleic acid amplification test was developed and its performance characteristics determined by [employer]. Nucleic acid amplification tests include PCR and TMA. This test has not been FDA cleared or approved. This test has been authorized by FDA under an Emergency Use Authorization (EUA). This test is only authorized for the  duration of time the declaration that circumstances exist justifying the authorization of the emergency use of in vitro diagnostic tests for detection of SARS-CoV-2 virus and/or diagnosis of COVID-19 infection under section 564(b)(1) of the Act, 21 U.S.C. [id number]) (1), unless the authorization is terminated or revoked sooner. When diagnostic testing is negative, the possibility of a false negative result should be considered in the context of a patient's recent exposures and the presence of clinical signs and symptoms consistent with COVID-19. An individual without symptoms of COVID- 19 and who is not shedding SARS-CoV-2 vi rus would expect to have a negative (not detected) result in this assay. Performed At: [facility] [address] [place], [place] [id number] Rush Farmer MD [id number]    [place]  Final    Comment: Performed at [facility], [place] [address] [place], [place] [address]    Renal Function: Recent Labs    10/18/19 1051  CREATININE 1.60*   Estimated Creatinine Clearance: 30.8 mL/min (A) (by C-G formula based on SCr of 1.6 mg/dL (H)).  Radiologic Imaging: No results found.  Impression/Assessment:  PCa   Plan:  I 125 brachytherapy

## 2019-10-23 NOTE — Progress Notes (Signed)
  Radiation Oncology         (336) [phone] ________________________________  Name: Kenneth Ross MRN: [id number]  Date: 10/23/2019  DOB: [birth year]       Prostate Seed Implant  [id number], [name], MD  No ref. provider found  DIAGNOSIS: 83 y.o. gentleman with Stage T1c adenocarcinoma of the prostate with Gleason score of 4+3, and PSA of 5.9..    ICD-10-CM   1. Preop testing  Z01.818 DG Chest 2 View    DG Chest 2 View    PROCEDURE: Insertion of radioactive I-125 seeds into the prostate gland.  RADIATION DOSE: 145 Gy, definitive therapy.  TECHNIQUE: Kenneth Ross was brought to the operating room with the urologist. He was placed in the dorsolithotomy position. He was catheterized and a rectal tube was inserted. The perineum was shaved, prepped and draped. The ultrasound probe was then introduced into the rectum to see the prostate gland.  TREATMENT DEVICE: A needle grid was attached to the ultrasound probe stand and anchor needles were placed.  3D PLANNING: The prostate was imaged in 3D using a sagittal sweep of the prostate probe. These images were transferred to the planning computer. There, the prostate, urethra and rectum were defined on each axial reconstructed image. Then, the software created an optimized 3D plan and a few seed positions were adjusted. The quality of the plan was reviewed using [facility] information for the target and the following two organs at risk:  Urethra and Rectum.  Then the accepted plan was printed and handed off to the radiation therapist.  Under my supervision, the custom loading of the seeds and spacers was carried out and loaded into sealed vicryl sleeves.  These pre-loaded needles were then placed into the needle holder.Marland Kitchen  PROSTATE VOLUME STUDY:  Using transrectal ultrasound the volume of the prostate was verified to be 40.8 cc.  SPECIAL TREATMENT PROCEDURE/SUPERVISION AND HANDLING: The pre-loaded needles were then delivered under sagittal  guidance. A total of 24 needles were used to deposit 65 seeds in the prostate gland. The individual seed activity was 0.484 mCi.  SpaceOAR:  Yes  COMPLEX SIMULATION: At the end of the procedure, an anterior radiograph of the pelvis was obtained to document seed positioning and count. Cystoscopy was performed to check the urethra and bladder.  MICRODOSIMETRY: At the end of the procedure, the patient was emitting 0.12 mR/hr at 1 meter. Accordingly, he was considered safe for hospital discharge.  PLAN: The patient will return to the radiation oncology clinic for post implant CT dosimetry in three weeks.   ________________________________  Kenneth Ross, M.D.

## 2019-10-24 ENCOUNTER — Encounter (HOSPITAL_BASED_OUTPATIENT_CLINIC_OR_DEPARTMENT_OTHER): Payer: Self-pay | Admitting: Urology

## 2019-11-10 ENCOUNTER — Telehealth: Payer: Self-pay | Admitting: *Deleted

## 2019-11-10 NOTE — Telephone Encounter (Signed)
CALLED PATIENT TO REMIND OF POST SEED APPTS. AND MRI FOR 11-11-19, LVM FOR A RETURN CALL

## 2019-11-11 ENCOUNTER — Ambulatory Visit
Admission: RE | Admit: 2019-11-11 | Discharge: 2019-11-11 | Disposition: A | Discharge status: Home / Self Care | Payer: PPO | Source: Ambulatory Visit | Attending: Radiation Oncology | Admitting: Radiation Oncology

## 2019-11-11 ENCOUNTER — Encounter: Payer: Self-pay | Admitting: Urology

## 2019-11-11 ENCOUNTER — Other Ambulatory Visit: Payer: Self-pay

## 2019-11-11 ENCOUNTER — Ambulatory Visit
Admission: RE | Admit: 2019-11-11 | Discharge: 2019-11-11 | Disposition: A | Discharge status: Home / Self Care | Payer: PPO | Source: Ambulatory Visit | Attending: Urology | Admitting: Urology

## 2019-11-11 ENCOUNTER — Ambulatory Visit (HOSPITAL_COMMUNITY)
Admission: RE | Admit: 2019-11-11 | Discharge: 2019-11-11 | Disposition: A | Discharge status: Home / Self Care | Payer: PPO | Source: Ambulatory Visit | Attending: Urology | Admitting: Urology

## 2019-11-11 VITALS — BP 111/75 | HR 92 | Temp 98.7°F | Resp 20

## 2019-11-11 DIAGNOSIS — C61 Malignant neoplasm of prostate: Secondary | ICD-10-CM | POA: Insufficient documentation

## 2019-11-11 NOTE — Progress Notes (Signed)
Kenneth Ross presents today for f/u post-seed implant with A. Bruning, PA-C. Pt reports occasional burning with urination, denies hematuria. Pt denies urine leakage. Pt denies diarrhea. Pt sees his urologist on Monday, 11/14/19. Pt will have MRI later today.   BP 111/75 (BP Location: Right Arm, Patient Position: Sitting, Cuff Size: Normal)   Pulse 92   Temp 98.7 F (37.1 C)   Resp 20   SpO2 99%   Kenneth Sousa, RN BSN

## 2019-11-11 NOTE — Progress Notes (Signed)
Radiation Oncology         (336) [phone] ________________________________  Name: Kenneth Ross MRN: [id number]  Date: 11/11/2019  DOB: 02/26/1936  Post-Seed Follow-Up Visit Note  CC: Tonia Ghent, MD  Tonia Ghent, MD  Diagnosis:   83 y.o. gentleman with Stage T1c adenocarcinoma of the prostate with Gleason score of 4+3, and PSA of 5.9.    ICD-10-CM   1. Prostate cancer ([place])  C61     Interval Since Last Radiation:  3 weeks 10/21/19:  Insertion of radioactive I-125 seeds into the prostate gland; 145 Gy, definitive/boost therapy with placement of SpaceOAR gel.  Narrative:  The patient returns today for routine follow-up.  He is complaining of increased urinary frequency and urinary hesitation symptoms. He filled out a questionnaire regarding urinary function today providing and overall IPSS score of 22 characterizing his symptoms as severe with occasional dysuria which is improving, increased frequency/urgency and occasional small volume UUI, weaker flow of stream, intermittency and straining to void.  He  Feels that he empties his bladder adequately for the most part.  He continues with nocturia x3/night but denies gross hematuria, flank pain, fever or chills.  His pre-implant score was 13. He denies any abdominal pain or bowel symptoms and reports a healthy appetite, maintaining his weight.  Overall, he is pleased with his progress to date.  ALLERGIES:  is allergic to iodinated diagnostic agents.  Meds: Current Outpatient Medications  Medication Sig Dispense Refill  . aspirin 81 MG tablet Take 81 mg by mouth daily.      . Calcium 500-125 MG-UNIT TABS Take 1 tablet by mouth daily.      . calcium carbonate (TUMS - DOSED IN MG ELEMENTAL CALCIUM) 500 MG chewable tablet Chew 1 tablet by mouth daily as needed for indigestion or heartburn.    . Cholecalciferol (VITAMIN D) 1000 UNITS capsule Take 1,000 Units by mouth daily.      . diphenhydrAMINE (BENADRYL) 25 MG tablet Take 25  mg by mouth as needed.      . fish oil-omega-3 fatty acids 1000 MG capsule Take 1 g by mouth daily.     . fluticasone (FLONASE) 50 MCG/ACT nasal spray Place 2 sprays into both nostrils daily. (Patient taking differently: Place 2 sprays into both nostrils at bedtime. )    . loratadine (CLARITIN) 10 MG tablet Take 10 mg by mouth daily.     . metoprolol tartrate (LOPRESSOR) 25 MG tablet Take 0.5-1 tablets (12.5-25 mg total) by mouth 2 (two) times daily as needed. (Patient taking differently: Take 12.5-25 mg by mouth 2 (two) times daily as needed. Per pt takes when is will be doing something strenous  (10-21-2019 per pt last taken one year ago)) 180 tablet 3  . Multiple Vitamin (MULTIVITAMIN) capsule Take 1 capsule by mouth daily.      . sildenafil (REVATIO) 20 MG tablet TAKE 3 TO 5 TABLETS BY MOUTH ONCE DAILY AS NEEDED (Patient taking differently: Take 20 mg by mouth as needed. TAKE 3 TO 5 TABLETS BY MOUTH ONCE DAILY AS NEEDED) 50 tablet prn  . simvastatin (ZOCOR) 40 MG tablet Take 1 tablet (40 mg total) by mouth every evening. (Patient taking differently: Take 40 mg by mouth every evening. ) 90 tablet 3   No current facility-administered medications for this encounter.    Physical Findings: In general this is a well appearing Caucasian male in no acute distress. He's alert and oriented x4 and appropriate throughout the examination. Cardiopulmonary assessment  is negative for acute distress and he exhibits normal effort.   Lab Findings: Lab Results  Component Value Date   WBC 5.8 10/18/2019   HGB 13.3 10/18/2019   HCT 40.9 10/18/2019   MCV 99.0 10/18/2019   PLT 249 10/18/2019    Radiographic Findings:  Patient underwent CT imaging in our clinic for post implant dosimetry. The CT will be reviewed by Dr. [name] to confirm there is an adequate distribution of radioactive seeds throughout the prostate gland and ensure that there are no seeds in or near the rectum. His scheduled for prostate MRI  this afternoon at 4pm and those images will be fused with his CT images for further evaluation. We suspect the final radiation plan and dosimetry will show appropriate coverage of the prostate gland. He understands that we will call and inform him of any unexpected findings on further review of his imaging and dosimetry.  Impression/Plan: 83 y.o. gentleman with Stage T1c adenocarcinoma of the prostate with Gleason score of 4+3, and PSA of 5.9. The patient is recovering from the effects of radiation. His urinary symptoms should gradually improve over the next 4-6 months. We talked about this today. He is encouraged by his improvement already and is otherwise pleased with his outcome. We also talked about long-term follow-up for prostate cancer following seed implant. He understands that ongoing PSA determinations and digital rectal exams will help perform surveillance to rule out disease recurrence. He has a follow up appointment scheduled with Dr. Diona Fanti on 11/14/19. He understands what to expect with his PSA measures. Patient was also educated today about some of the long-term effects from radiation including a small risk for rectal bleeding and possibly erectile dysfunction. We talked about some of the general management approaches to these potential complications. However, I did encourage the patient to contact our office or return at any point if he has questions or concerns related to his previous radiation and prostate cancer.    [name], PA-C

## 2019-11-11 NOTE — Patient Instructions (Signed)
Coronavirus (COVID-19) Are you at risk?  Are you at risk for the Coronavirus (COVID-19)?  To be considered HIGH RISK for Coronavirus (COVID-19), you have to meet the following criteria:  . Traveled to [place], [place], [place], [place] or [place]; or in the [place] to [place], [place], [place], or [place]; and have fever, cough, and shortness of breath within the last 2 weeks of travel OR . Been in close contact with a person diagnosed with COVID-19 within the last 2 weeks and have fever, cough, and shortness of breath . IF YOU DO NOT MEET THESE CRITERIA, YOU ARE CONSIDERED LOW RISK FOR COVID-19.  What to do if you are HIGH RISK for COVID-19?  . If you are having a medical emergency, call 911. . Seek medical care right away. Before you go to a doctor's office, urgent care or emergency department, call ahead and tell them about your recent travel, contact with someone diagnosed with COVID-19, and your symptoms. You should receive instructions from your physician's office regarding next steps of care.  . When you arrive at healthcare provider, tell the healthcare staff immediately you have returned from visiting [place], [place], [place], [place] or [place]; or traveled in the [place] to [place], [place], [place], or [place]; in the last two weeks or you have been in close contact with a person diagnosed with COVID-19 in the last 2 weeks.   . Tell the health care staff about your symptoms: fever, cough and shortness of breath. . After you have been seen by a medical provider, you will be either: o Tested for (COVID-19) and discharged home on quarantine except to seek medical care if symptoms worsen, and asked to  - Stay home and avoid contact with others until you get your results (4-5 days)  - Avoid travel on public transportation if possible (such as bus, train, or airplane) or o Sent to the Emergency Department by EMS for evaluation, COVID-19 testing, and possible  admission depending on your condition and test results.  What to do if you are LOW RISK for COVID-19?  Reduce your risk of any infection by using the same precautions used for avoiding the common cold or flu:  . Wash your hands often with soap and warm water for at least 20 seconds.  If soap and water are not readily available, use an alcohol-based hand sanitizer with at least 60% alcohol.  . If coughing or sneezing, cover your mouth and nose by coughing or sneezing into the elbow areas of your shirt or coat, into a tissue or into your sleeve (not your hands). . Avoid shaking hands with others and consider head nods or verbal greetings only. . Avoid touching your eyes, nose, or mouth with unwashed hands.  . Avoid close contact with people who are sick. . Avoid places or events with large numbers of people in one location, like concerts or sporting events. . Carefully consider travel plans you have or are making. . If you are planning any travel outside or inside the [place], visit the CDC's Travelers' Health webpage for the latest health notices. . If you have some symptoms but not all symptoms, continue to monitor at home and seek medical attention if your symptoms worsen. . If you are having a medical emergency, call 911.   ADDITIONAL HEALTHCARE OPTIONS FOR PATIENTS  [facility] [place] / e-Visit: [email or web address]         [facility]: [phone]  [facility]: [phone]                   [facility]: [phone]   

## 2019-11-14 DIAGNOSIS — C61 Malignant neoplasm of prostate: Secondary | ICD-10-CM | POA: Diagnosis not present

## 2019-11-14 NOTE — Progress Notes (Signed)
  Radiation Oncology         [phone] ________________________________  Name: Kenneth Ross MRN: [id number]  Date: 11/11/2019  DOB: 07/18/1936  COMPLEX SIMULATION NOTE  NARRATIVE:  The patient was brought to the [facility] today following prostate seed implantation approximately one month ago.  Identity was confirmed.  All relevant records and images related to the planned course of therapy were reviewed.  Then, the patient was set-up supine.  CT images were obtained.  The CT images were loaded into the planning software.  Then the prostate and rectum were contoured.  Treatment planning then occurred.  The implanted iodine 125 seeds were identified by the physics staff for projection of radiation distribution  I have requested : 3D Simulation  I have requested a DVH of the following structures: Prostate and rectum.    ________________________________  [name] [name], M.D.

## 2019-11-29 ENCOUNTER — Encounter: Payer: Self-pay | Admitting: Radiation Oncology

## 2019-11-29 DIAGNOSIS — C61 Malignant neoplasm of prostate: Secondary | ICD-10-CM | POA: Diagnosis not present

## 2019-12-01 NOTE — Progress Notes (Signed)
  Radiation Oncology         [phone] ________________________________  Name: Kenneth Ross MRN: [id number]  Date: 11/29/2019  DOB: Oct 12, 1936  3D Planning Note   Prostate Brachytherapy Post-Implant Dosimetry  Diagnosis: 83 y.o. gentleman with Stage T1c adenocarcinoma of the prostate with Gleason score of 4+3, and PSA of 5.9  Narrative: On a previous date, Kenneth Ross returned following prostate seed implantation for post implant planning. He underwent CT scan complex simulation to delineate the three-dimensional structures of the pelvis and demonstrate the radiation distribution.  Since that time, the seed localization, and complex isodose planning with dose volume histograms have now been completed.  Results:   Prostate Coverage - The dose of radiation delivered to the 90% or more of the prostate gland (D90) was 99.47% of the prescription dose. This exceeds our goal of greater than 90%. Rectal Sparing - The volume of rectal tissue receiving the prescription dose or higher was 0.0 cc. This falls under our thresholds tolerance of 1.0 cc.  Impression: The prostate seed implant appears to show adequate target coverage and appropriate rectal sparing.  Plan:  The patient will continue to follow with urology for ongoing PSA determinations. I would anticipate a high likelihood for local tumor control with minimal risk for rectal morbidity.  ________________________________  [name] [name], M.D.

## 2019-12-02 HISTORY — PX: CATARACT EXTRACTION: SUR2

## 2020-01-05 ENCOUNTER — Ambulatory Visit (INDEPENDENT_AMBULATORY_CARE_PROVIDER_SITE_OTHER): Payer: PPO | Admitting: Family Medicine

## 2020-01-05 ENCOUNTER — Encounter: Payer: Self-pay | Admitting: Family Medicine

## 2020-01-05 ENCOUNTER — Other Ambulatory Visit: Payer: Self-pay

## 2020-01-05 VITALS — BP 122/66 | HR 84 | Temp 96.5°F | Ht 64.0 in | Wt 141.1 lb

## 2020-01-05 DIAGNOSIS — R0683 Snoring: Secondary | ICD-10-CM

## 2020-01-05 NOTE — Progress Notes (Signed)
This visit occurred during the SARS-CoV-2 public health emergency.  Safety protocols were in place, including screening questions prior to the visit, additional usage of staff PPE, and extensive cleaning of exam room while observing appropriate contact time as indicated for disinfecting solutions.  Nighttime breathing d/w pt.  Snoring a lot at night.  She heard him gasping at night. Going on for years.  No AM HA.  Not waking tired.  Normally goes to bed at 9:30-10:30 PM.  Quickly goes to sleep.  Gets up about 6:30 or 7, waking on his own.  Doesn't need an alarm for that.  Not having much fatigue in the day.  He can nod off in a movie, occ naps.  He doesn't nod off in traffic at red light.    No CP, not SOB.  He has had nocturia after prev prostate seed implant.  He had urology f/u in the meantime.   [employer], covid vaccine done 12/26/19.  He has f/u vaccine pending.    Meds, vitals, and allergies reviewed.   ROS: Per HPI unless specifically indicated in ROS section   GEN: nad, alert and oriented HEENT: ncat NECK: supple w/o LA, 15" neck.   CV: rrr. PULM: ctab, no inc wob EXT: no edema SKIN: Well-perfused.

## 2020-01-05 NOTE — Patient Instructions (Signed)
We'll call about seeing pulmonary for sleep apnea testing.  Take care.  Glad to see you. Update me as needed.

## 2020-01-08 DIAGNOSIS — R0683 Snoring: Secondary | ICD-10-CM | POA: Insufficient documentation

## 2020-01-08 NOTE — Assessment & Plan Note (Signed)
Concern for possible sleep apnea.  Pathophysiology discussed with patient.  Refer to pulmonary for testing.  Okay for outpatient follow-up.  Routine cautions regarding activity while fatigued given to patient.  He agrees.

## 2020-02-06 DIAGNOSIS — C61 Malignant neoplasm of prostate: Secondary | ICD-10-CM | POA: Diagnosis not present

## 2020-02-13 DIAGNOSIS — C61 Malignant neoplasm of prostate: Secondary | ICD-10-CM | POA: Diagnosis not present

## 2020-02-13 DIAGNOSIS — R3915 Urgency of urination: Secondary | ICD-10-CM | POA: Diagnosis not present

## 2020-02-21 ENCOUNTER — Ambulatory Visit: Payer: PPO | Admitting: Pulmonary Disease

## 2020-02-21 ENCOUNTER — Encounter: Payer: Self-pay | Admitting: Pulmonary Disease

## 2020-02-21 ENCOUNTER — Other Ambulatory Visit: Payer: Self-pay

## 2020-02-21 VITALS — BP 122/68 | HR 83 | Temp 97.3°F | Ht 64.0 in | Wt 141.0 lb

## 2020-02-21 DIAGNOSIS — R0683 Snoring: Secondary | ICD-10-CM

## 2020-02-21 NOTE — Progress Notes (Signed)
[facility]  Chief Complaint  Patient presents with  . Sleep Consult    Referred by Dr. Elsie Stain. Pt c/o snoring and spouse has witnessed him holding his breath during sleep. Epworth score = 5.       Constitutional:  BP 122/68 (BP Location: Left Arm, Cuff Size: Normal)   Pulse 83   Temp (!) 97.3 F (36.3 C) (Temporal)   Ht 5\' 4"  (1.626 m)   Wt 141 lb (64 kg)   SpO2 99% Comment: on RA  BMI 24.20 kg/m   Past Medical History:  Allergies, Bells palsy, CKD 3, ED, GERD, Renal cell carcinoma, HLD, Scoliosis, Prostate cancer  Brief Summary:  Kenneth Ross is a 84 y.o. male with snoring.  His wife has been concerned about his snoring.  He is also getting shallow breathing at night, and wakes up flailing.  He has trouble sleeping on his back.  He goes to sleep at 10 pm.  He falls asleep in 5 minutes.  He wakes up 2 times to use the bathroom.  He gets out of bed at 7 am.  He feels tired sometimes in the morning.  He denies morning headache.  He does not use anything to help him fall sleep or stay awake.  Naps for 1 hour in the afternoon.  He denies sleep walking, sleep talking, bruxism, or nightmares.  There is no history of restless legs.  He denies sleep hallucinations, sleep paralysis, or cataplexy.  The Epworth score is 7 out of 24.     Physical Exam:   Appearance - well kempt   ENMT - clear nasal mucosa, midline nasal  septum, no oral exudates, no LAN, trachea midline  Respiratory - normal chest wall, normal respiratory effort, no accessory muscle use, no wheeze/rales  CV - s1s2 regular rate and rhythm, no murmurs, no peripheral edema, radial pulses symmetric  GI - soft, non tender, no masses  Lymph - no adenopathy noted in neck and axillary areas  MSK - normal gait  Ext - no cyanosis, clubbing, or joint inflammation noted  Skin - no rashes, lesions, or ulcers  Neuro - normal strength, oriented x 3  Psych - normal  mood and affect  Discussion:  He has snoring, sleep disruption, apnea, and daytime sleepiness.  He has history of hypertension.  I am concerned he could have obstructive sleep apnea.  Assessment/Plan:   Snoring with excessive daytime sleepiness. - will need to arrange for a home sleep study  Obesity. - discussed how weight can impact sleep and risk for sleep disordered breathing - discussed options to assist with weight loss: combination of diet modification, cardiovascular and strength training exercises  Cardiovascular risk. - had an extensive discussion regarding the adverse health consequences related to untreated sleep disordered breathing - specifically discussed the risks for hypertension, coronary artery disease, cardiac dysrhythmias, cerebrovascular disease, and diabetes - lifestyle modification discussed  Safe driving practices. - discussed how sleep disruption can increase risk of accidents, particularly when driving - safe driving practices were discussed  Therapies for obstructive sleep apnea. - if the sleep study shows significant sleep apnea, then various therapies for treatment were reviewed: CPAP, oral appliance, and surgical interventions.   Patient Instructions  Will arrange for home sleep study Will call to arrange for follow up after sleep study reviewed   Time spent 32 minutes  Chesley Mires, MD [facility] Pager: [phone] 02/21/2020, 12:11 PM  Flow Sheet  Sleep tests:    Medications:   Allergies as of 02/21/2020      Reactions   Iodinated Diagnostic Agents Nausea And Vomiting   IV dye      Medication List       Accurate as of February 21, 2020 12:11 PM. If you have any questions, ask your nurse or doctor.        aspirin 81 MG tablet Take 81 mg by mouth daily.   Calcium 500-125 MG-UNIT Tabs Take 1 tablet by mouth daily.   calcium carbonate 500 MG chewable tablet Commonly known as: TUMS - dosed in mg elemental  calcium Chew 1 tablet by mouth daily as needed for indigestion or heartburn.   diphenhydrAMINE 25 MG tablet Commonly known as: BENADRYL Take 25 mg by mouth as needed.   fish oil-omega-3 fatty acids 1000 MG capsule Take 1 g by mouth daily.   fluticasone 50 MCG/ACT nasal spray Commonly known as: FLONASE Place 2 sprays into both nostrils daily.   loratadine 10 MG tablet Commonly known as: CLARITIN Take 10 mg by mouth daily.   metoprolol tartrate 25 MG tablet Commonly known as: LOPRESSOR Take 0.5-1 tablets (12.5-25 mg total) by mouth 2 (two) times daily as needed.   multivitamin capsule Take 1 capsule by mouth daily.   sildenafil 20 MG tablet Commonly known as: REVATIO TAKE 3 TO 5 TABLETS BY MOUTH ONCE DAILY AS NEEDED   simvastatin 40 MG tablet Commonly known as: Zocor Take 1 tablet (40 mg total) by mouth every evening.   Vitamin D 1000 units capsule Take 1,000 Units by mouth daily.       Past Surgical History:  He  has a past surgical history that includes Nephrectomy (Left, 10/1985); Laminectomy (1993); Shoulder open rotator cuff repair (12/1998); Shoulder open rotator cuff repair (Bilateral, 08/2001); Cataract extraction (Left, 06/10/2018); Cataract extraction w/ intraocular lens implant (Left); Radioactive seed implant (N/A, 10/21/2019); and SPACE OAR INSTILLATION (N/A, 10/21/2019).  Family History:  His family history includes Arthritis in his mother; Cancer in his father and mother; Esophageal cancer in his paternal aunt; Fibromyalgia in his mother; Heart disease in his father; Lupus in his sister; Prostate cancer in his father; Stroke in his father and mother.  Social History:  He  reports that he has never smoked. He has never used smokeless tobacco. He reports that he does not drink alcohol or use drugs.

## 2020-02-21 NOTE — Patient Instructions (Signed)
Will arrange for home sleep study Will call to arrange for follow up after sleep study reviewed  

## 2020-03-09 DIAGNOSIS — H524 Presbyopia: Secondary | ICD-10-CM | POA: Diagnosis not present

## 2020-03-09 DIAGNOSIS — H2511 Age-related nuclear cataract, right eye: Secondary | ICD-10-CM | POA: Diagnosis not present

## 2020-03-14 ENCOUNTER — Ambulatory Visit: Payer: PPO

## 2020-03-14 ENCOUNTER — Other Ambulatory Visit: Payer: Self-pay

## 2020-03-14 DIAGNOSIS — R0683 Snoring: Secondary | ICD-10-CM

## 2020-03-14 DIAGNOSIS — G4733 Obstructive sleep apnea (adult) (pediatric): Secondary | ICD-10-CM | POA: Diagnosis not present

## 2020-03-19 DIAGNOSIS — G4733 Obstructive sleep apnea (adult) (pediatric): Secondary | ICD-10-CM | POA: Diagnosis not present

## 2020-03-20 ENCOUNTER — Telehealth: Payer: Self-pay | Admitting: Pulmonary Disease

## 2020-03-20 NOTE — Telephone Encounter (Signed)
HST 03/14/20 >> AHI 47.6, SpO2 low 69%.   Please inform him that his sleep study shows severe obstructive sleep apnea.  Please arrange for ROV with me or NP to discuss treatment options.

## 2020-03-23 NOTE — Telephone Encounter (Signed)
Left message for patient to call back  

## 2020-03-23 NOTE — Telephone Encounter (Signed)
Pt is returning our call - please return call on mobile

## 2020-03-26 NOTE — Telephone Encounter (Signed)
Pt returning call.  [phone]

## 2020-03-26 NOTE — Telephone Encounter (Signed)
Spoke with pt. He is aware of results. OV has been scheduled with Beth on 03/28/20 at 1400. Nothing further was needed.

## 2020-03-28 ENCOUNTER — Other Ambulatory Visit: Payer: Self-pay

## 2020-03-28 ENCOUNTER — Ambulatory Visit (INDEPENDENT_AMBULATORY_CARE_PROVIDER_SITE_OTHER): Payer: PPO | Admitting: Primary Care

## 2020-03-28 DIAGNOSIS — G4733 Obstructive sleep apnea (adult) (pediatric): Secondary | ICD-10-CM

## 2020-03-28 NOTE — Progress Notes (Signed)
Virtual Visit via Telephone Note  I connected with Kenneth Ross on 03/28/20 at  2:00 PM EDT by telephone and verified that I am speaking with the correct person using two identifiers.  Location: Patient: Home Provider: Home   I discussed the limitations, risks, security and privacy concerns of performing an evaluation and management service by telephone and the availability of in person appointments. I also discussed with the patient that there may be a patient responsible charge related to this service. The patient expressed understanding and agreed to proceed.   History of Present Illness: 84 year old male, never smoked. PMH significant for snoring, OSA, prostate cancer, scoliosis, hypercholesteremia, tachycardia. Patient of Dr. Halford Chessman, originally seen for consult on 02/21/20 for snoring. Epworth 5/24.   03/28/2020  Patient contacted for televisit. Originally seen for snoring and apnea by Dr. Halford Chessman in March. Home sleep study on 03/14/20 showed severe sleep apnea with AHI 47.6/hr. SpO2 low 68%. Reviewed treatment options. Patient agreeing to CPAP. Needs referral for new CPAP, auto titrate 5-15cm h20   Observations/Objective:  - Able to speak in full sentences, no overt shortness of breath or wheezing   Assessment and Plan:  OSA: - HST 03/14/20 showed severe obstructive sleep apnea; AHI 47.6.hr  - Review treatment options of OSA including weight loss, side sleeping position, oral appliance, CPAP, referral to ENT. Patient agreeing to CPAP therapy - Needs referral to DME for new CPAP start, auto titrate 5-15cm h20, mask of choice, heated humidity and enroll in [place] - Advised patient aim to wear CPAP every night for 4-6 hours or more. DO not drive if experiencing excessive daytime fatigue or somnolence.   Follow Up Instructions:  - FU 31-90s days for compliance check    I discussed the assessment and treatment plan with the patient. The patient was provided an opportunity to ask  questions and all were answered. The patient agreed with the plan and demonstrated an understanding of the instructions.   The patient was advised to call back or seek an in-person evaluation if the symptoms worsen or if the condition fails to improve as anticipated.  I provided 18 minutes of non-face-to-face time during this encounter.   Martyn Ehrich, NP

## 2020-04-11 ENCOUNTER — Telehealth: Payer: Self-pay | Admitting: Pulmonary Disease

## 2020-04-11 DIAGNOSIS — G4733 Obstructive sleep apnea (adult) (pediatric): Secondary | ICD-10-CM

## 2020-04-11 NOTE — Telephone Encounter (Signed)
HST was completed 03/14/20 and then pt was scheduled visit with [facility] 4/28 to discuss HST results in detail and tx options.  Looked at [facility] and there is nothing documented in the OV and also no order was placed.  Called and spoke with pt letting him know that we were going to check with Beth on the cpap rx for him and we would get it taken care of. Stated once we had an update, we would call him back and let him know. Pt verbalized understanding.  Beth, please advise settings that needed to be placed for pt's new cpap start as I was not seeing anything documented in the recent televisit pt had with you on 4/28.

## 2020-04-11 NOTE — Telephone Encounter (Signed)
Apologies for why that wasn't set up. Can you please place an order for CPAP auto titrate 5-15cm h20, mask of choice, humidification, supplies and enroll in [place]. Needs follow-up in 6 weeks.

## 2020-04-11 NOTE — Telephone Encounter (Signed)
Spoke with patient and apologized to him for the order not being placed. I advised him that I would go ahead and place the order today. He was concerned because he is currently scheduled for eye surgery next month and wanted to have the machine before then.   I advised him that we would try to get this processed before then. He verbalized understanding.   Nothing further needed at time of call.

## 2020-04-20 ENCOUNTER — Telehealth: Payer: Self-pay | Admitting: Pulmonary Disease

## 2020-04-20 NOTE — Telephone Encounter (Signed)
Spoke with Caryl Pina, advised her that we did have an order for CPAP that we sent to [facility]. She will let Estill Bamberg know and if she has any other questions, she will call back.

## 2020-04-26 DIAGNOSIS — G4733 Obstructive sleep apnea (adult) (pediatric): Secondary | ICD-10-CM | POA: Diagnosis not present

## 2020-05-16 DIAGNOSIS — H2511 Age-related nuclear cataract, right eye: Secondary | ICD-10-CM | POA: Diagnosis not present

## 2020-05-16 DIAGNOSIS — H25811 Combined forms of age-related cataract, right eye: Secondary | ICD-10-CM | POA: Diagnosis not present

## 2020-05-27 DIAGNOSIS — G4733 Obstructive sleep apnea (adult) (pediatric): Secondary | ICD-10-CM | POA: Diagnosis not present

## 2020-05-29 DIAGNOSIS — G4733 Obstructive sleep apnea (adult) (pediatric): Secondary | ICD-10-CM | POA: Diagnosis not present

## 2020-05-30 DIAGNOSIS — C61 Malignant neoplasm of prostate: Secondary | ICD-10-CM | POA: Diagnosis not present

## 2020-06-06 DIAGNOSIS — N400 Enlarged prostate without lower urinary tract symptoms: Secondary | ICD-10-CM | POA: Diagnosis not present

## 2020-06-06 DIAGNOSIS — Z8546 Personal history of malignant neoplasm of prostate: Secondary | ICD-10-CM | POA: Diagnosis not present

## 2020-06-19 ENCOUNTER — Ambulatory Visit: Payer: PPO | Admitting: Pulmonary Disease

## 2020-06-19 ENCOUNTER — Encounter: Payer: Self-pay | Admitting: Pulmonary Disease

## 2020-06-19 ENCOUNTER — Other Ambulatory Visit: Payer: Self-pay

## 2020-06-19 VITALS — BP 90/54 | HR 87 | Temp 98.0°F | Ht 64.0 in | Wt 138.2 lb

## 2020-06-19 DIAGNOSIS — G4733 Obstructive sleep apnea (adult) (pediatric): Secondary | ICD-10-CM | POA: Diagnosis not present

## 2020-06-19 NOTE — Patient Instructions (Signed)
Follow up in 1 year.

## 2020-06-19 NOTE — Progress Notes (Signed)
[facility]  Chief Complaint  Patient presents with  . Follow-up    Pt states overall doing well. He states occ air will leak from mask when pressure ramps up.    Constitutional:  BP (!) 90/54 (BP Location: Left Arm, Cuff Size: Normal)   Pulse 87   Temp 98 F (36.7 C) (Oral)   Ht 5\' 4"  (1.626 m)   Wt 138 lb 3.2 oz (62.7 kg)   SpO2 100% Comment: on RA  BMI 23.72 kg/m   Past Medical History:  Allergies, Bells palsy, CKD 3, ED, GERD, Renal cell carcinoma, HLD, Scoliosis, Prostate cancer  Brief Summary:  Kenneth Ross is a 84 y.o. male with obstructive sleep apnea.  Subjective:  He had home sleep study in April.  Severe OSA.  Started on CPAP.  Using full face mask.  Wakes up sometimes with mask leak and pressure too high.  Gets some dryness in mouth.  Sleeping better.  Still feels sleepy at times in the afternoon.  Physical Exam:   Appearance - well kempt   ENMT - no sinus tenderness, no oral exudate, no LAN, Mallampati 3 airway, no stridor, extensive dental work  Respiratory - equal breath sounds bilaterally, no wheezing or rales  CV - s1s2 regular rate and rhythm, no murmurs  Ext - no clubbing, no edema  Skin - no rashes  Psych - normal mood and affect    Assessment/Plan:   Obstructive sleep apnea. - reviewed his sleep study - discussed options to improve mask fit - he is compliant with CPAP - will change auto CPAP to 5 -10 cm H2O to see if this reduces mask leak  Insufficient sleep. - advised him to try getting 7 to 8 hrs sleep per night - don't think he needs stimulant medication at this time  A total of  22 minutes spent addressing patient care issues on day of visit.  Follow up:   Patient Instructions  Follow up in 1 year   Signature:  Chesley Mires, MD [facility] Pager: [phone] 06/19/2020, 11:28 AM  Flow Sheet    Sleep tests:   HST 03/14/20 >> AHI 47.6, SpO2 low  69%  Auto CPAP 05/19/20 to 06/17/20 >> used on 30 of 30 nights with average 5 hrs 59 min.  Average AHI 6.2 with median CPAP 9 and 95 th percentile CPAP 12 cm H2O  Medications:   Allergies as of 06/19/2020      Reactions   Iodinated Diagnostic Agents Nausea And Vomiting   IV dye      Medication List       Accurate as of June 19, 2020 11:28 AM. If you have any questions, ask your nurse or doctor.        aspirin 81 MG tablet Take 81 mg by mouth daily.   Calcium 500-125 MG-UNIT Tabs Take 1 tablet by mouth daily.   calcium carbonate 500 MG chewable tablet Commonly known as: TUMS - dosed in mg elemental calcium Chew 1 tablet by mouth daily as needed for indigestion or heartburn.   diphenhydrAMINE 25 MG tablet Commonly known as: BENADRYL Take 25 mg by mouth as needed.   fish oil-omega-3 fatty acids 1000 MG capsule Take 1 g by mouth daily.   fluticasone 50 MCG/ACT nasal spray Commonly known as: FLONASE Place 2 sprays into both nostrils daily.   loratadine 10 MG tablet Commonly known as: CLARITIN Take 10 mg by mouth daily.   metoprolol  tartrate 25 MG tablet Commonly known as: LOPRESSOR Take 0.5-1 tablets (12.5-25 mg total) by mouth 2 (two) times daily as needed.   multivitamin capsule Take 1 capsule by mouth daily.   sildenafil 20 MG tablet Commonly known as: REVATIO TAKE 3 TO 5 TABLETS BY MOUTH ONCE DAILY AS NEEDED   simvastatin 40 MG tablet Commonly known as: Zocor Take 1 tablet (40 mg total) by mouth every evening.   Vitamin D 1000 units capsule Take 1,000 Units by mouth daily.       Past Surgical History:  He  has a past surgical history that includes Nephrectomy (Left, 10/1985); Laminectomy (1993); Shoulder open rotator cuff repair (12/1998); Shoulder open rotator cuff repair (Bilateral, 08/2001); Cataract extraction (Left, 06/10/2018); Cataract extraction w/ intraocular lens implant (Left); Radioactive seed implant (N/A, 10/21/2019); and SPACE OAR  INSTILLATION (N/A, 10/21/2019).  Family History:  His family history includes Arthritis in his mother; Cancer in his father and mother; Esophageal cancer in his paternal aunt; Fibromyalgia in his mother; Heart disease in his father; Lupus in his sister; Prostate cancer in his father; Stroke in his father and mother.  Social History:  He  reports that he has never smoked. He has never used smokeless tobacco. He reports that he does not drink alcohol and does not use drugs.

## 2020-06-26 DIAGNOSIS — G4733 Obstructive sleep apnea (adult) (pediatric): Secondary | ICD-10-CM | POA: Diagnosis not present

## 2020-08-26 ENCOUNTER — Other Ambulatory Visit: Payer: Self-pay | Admitting: Family Medicine

## 2020-08-26 DIAGNOSIS — E7800 Pure hypercholesterolemia, unspecified: Secondary | ICD-10-CM

## 2020-09-03 ENCOUNTER — Other Ambulatory Visit: Payer: Self-pay

## 2020-09-03 ENCOUNTER — Other Ambulatory Visit (INDEPENDENT_AMBULATORY_CARE_PROVIDER_SITE_OTHER): Payer: PPO

## 2020-09-03 DIAGNOSIS — E7800 Pure hypercholesterolemia, unspecified: Secondary | ICD-10-CM | POA: Diagnosis not present

## 2020-09-03 LAB — COMPREHENSIVE METABOLIC PANEL
ALT: 20 U/L (ref 0–53)
AST: 17 U/L (ref 0–37)
Albumin: 4.5 g/dL (ref 3.5–5.2)
Alkaline Phosphatase: 69 U/L (ref 39–117)
BUN: 22 mg/dL (ref 6–23)
CO2: 30 mEq/L (ref 19–32)
Calcium: 9.5 mg/dL (ref 8.4–10.5)
Chloride: 105 mEq/L (ref 96–112)
Creatinine, Ser: 1.66 mg/dL — ABNORMAL HIGH (ref 0.40–1.50)
GFR: 39.88 mL/min — ABNORMAL LOW (ref >60.00)
Glucose, Bld: 91 mg/dL (ref 70–99)
Potassium: 4.2 mEq/L (ref 3.5–5.1)
Sodium: 142 mEq/L (ref 135–145)
Total Bilirubin: 0.4 mg/dL (ref 0.2–1.2)
Total Protein: 7.1 g/dL (ref 6.0–8.3)

## 2020-09-03 LAB — LIPID PANEL
Cholesterol: 149 mg/dL (ref 0–200)
HDL: 36.4 mg/dL — ABNORMAL LOW (ref >39.00)
LDL Cholesterol: 89 mg/dL (ref 0–99)
NonHDL: 113.01
Total CHOL/HDL Ratio: 4
Triglycerides: 122 mg/dL (ref 0.0–149.0)
VLDL: 24.4 mg/dL (ref 0.0–40.0)

## 2020-09-06 ENCOUNTER — Other Ambulatory Visit: Payer: Self-pay

## 2020-09-06 ENCOUNTER — Encounter: Payer: Self-pay | Admitting: Family Medicine

## 2020-09-06 ENCOUNTER — Ambulatory Visit (INDEPENDENT_AMBULATORY_CARE_PROVIDER_SITE_OTHER): Payer: PPO | Admitting: Family Medicine

## 2020-09-06 VITALS — BP 102/58 | HR 100 | Temp 97.3°F | Ht 64.0 in | Wt 139.1 lb

## 2020-09-06 DIAGNOSIS — Z0000 Encounter for general adult medical examination without abnormal findings: Secondary | ICD-10-CM

## 2020-09-06 DIAGNOSIS — E7800 Pure hypercholesterolemia, unspecified: Secondary | ICD-10-CM

## 2020-09-06 DIAGNOSIS — R000 Tachycardia, unspecified: Secondary | ICD-10-CM

## 2020-09-06 DIAGNOSIS — C61 Malignant neoplasm of prostate: Secondary | ICD-10-CM

## 2020-09-06 DIAGNOSIS — Z23 Encounter for immunization: Secondary | ICD-10-CM

## 2020-09-06 DIAGNOSIS — Z7189 Other specified counseling: Secondary | ICD-10-CM

## 2020-09-06 DIAGNOSIS — N289 Disorder of kidney and ureter, unspecified: Secondary | ICD-10-CM | POA: Diagnosis not present

## 2020-09-06 DIAGNOSIS — N529 Male erectile dysfunction, unspecified: Secondary | ICD-10-CM

## 2020-09-06 DIAGNOSIS — M4120 Other idiopathic scoliosis, site unspecified: Secondary | ICD-10-CM

## 2020-09-06 MED ORDER — SILDENAFIL CITRATE 20 MG PO TABS: ORAL_TABLET | ORAL | 99 refills | Status: DC

## 2020-09-06 MED ORDER — SIMVASTATIN 40 MG PO TABS: 40.0000 mg | ORAL_TABLET | Freq: Every evening | ORAL | 3 refills | Status: DC

## 2020-09-06 NOTE — Patient Instructions (Addendum)
Flu shot today.  Covid booster after 10/01/20.   Take care.  Glad to see you. I would use tylenol but not ibuprofen.

## 2020-09-06 NOTE — Progress Notes (Signed)
This visit occurred during the SARS-CoV-2 public health emergency.  Safety protocols were in place, including screening questions prior to the visit, additional usage of staff PPE, and extensive cleaning of exam room while observing appropriate contact time as indicated for disinfecting solutions.  I have personally reviewed the Medicare Annual Wellness questionnaire and have noted 1. The patient's medical and social history 2. Their use of alcohol, tobacco or illicit drugs 3. Their current medications and supplements 4. The patient's functional ability including ADL's, fall risks, home safety risks and hearing or visual             impairment. 5. Diet and physical activities 6. Evidence for depression or mood disorders  The patients weight, height, BMI have been recorded in the chart and visual acuity is per eye clinic.  I have made referrals, counseling and provided education to the patient based review of the above and I have provided the pt with a written personalized care plan for preventive services.  Provider list updated- see scanned forms.  Routine anticipatory guidance given to patient.  See health maintenance. The possibility exists that previously documented standard health maintenance information may have been brought forward from a previous encounter into this note.  If needed, that same information has been updated to reflect the current situation based on today's encounter.    Flu 2021 Shingles up-to-date PNA up-to-date Tetanus 2018 COVID vaccine up-to-date with booster pending.  Discussed with patient. Colon cancer screening not due given his age.  He agrees per [facility].  I will defer.  He agrees. Advance directive-wife and daughter equally designated if patient were incapacitated. Cognitive function addressed- see scanned forms- and if abnormal then additional documentation follows.   Elevated Cholesterol: Using medications without problems: yes Muscle aches:  no Diet compliance: yes Exercise: yes, walking daily.    He isn't lightheaded.  No recent metoprolol use.  No CP. No SOB.  No BLE edema.  Pulse is usually <100.  ED.  Sildenafil worked w/o ADE.  D/w pt.    Cr slightly elevated from prev.  H/o nephrectomy.  D/w pt about avoiding nsaids.   Prostate cancer per urology s/p seed implant.    Scoliosis at baseline.  He is putting up with that.  His back gets "tired" but he is able to rest for a little and then keep going.    PMH and SH reviewed  Meds, vitals, and allergies reviewed.   ROS: Per HPI.  Unless specifically indicated otherwise in HPI, the patient denies:  General: fever. Eyes: acute vision changes ENT: sore throat Cardiovascular: chest pain Respiratory: SOB GI: vomiting GU: dysuria Musculoskeletal: acute back pain Derm: acute rash Neuro: acute motor dysfunction Psych: worsening mood Endocrine: polydipsia Heme: bleeding Allergy: hayfever  GEN: nad, alert and oriented HEENT: ncat NECK: supple w/o LA CV: rrr. PULM: ctab, no inc wob ABD: soft, +bs EXT: no edema SKIN: no acute rash Scoliosis at baseline.

## 2020-09-12 NOTE — Assessment & Plan Note (Signed)
He is putting up with scoliosis and trying to stay as active as possible.  He will update me as needed.

## 2020-09-12 NOTE — Assessment & Plan Note (Signed)
Continue simvastatin.  Continue work on diet and exercise.  He agrees. 

## 2020-09-12 NOTE — Assessment & Plan Note (Signed)
Cr slightly elevated from prev.  H/o nephrectomy.  D/w pt about avoiding nsaids.  We can recheck periodically.  He agrees.

## 2020-09-12 NOTE — Assessment & Plan Note (Signed)
He isn't lightheaded.  No recent metoprolol use.  No CP. No SOB.  No BLE edema.  Pulse is usually <100.  He can use metoprolol if needed.

## 2020-09-12 NOTE — Assessment & Plan Note (Signed)
Prostate cancer per urology s/p seed implant.   I will defer to urology.  He agrees.

## 2020-09-12 NOTE — Assessment & Plan Note (Signed)
Advance directive-wife and daughter equally designated if patient were incapacitated.

## 2020-09-12 NOTE — Assessment & Plan Note (Signed)
ED.  Sildenafil worked w/o ADE.  D/w pt.   continue as needed sildenafil use.

## 2020-09-12 NOTE — Assessment & Plan Note (Signed)
Flu 2021 Shingles up-to-date PNA up-to-date Tetanus 2018 COVID vaccine up-to-date with booster pending.  Discussed with patient. Colon cancer screening not due given his age.  He agrees PSA per [facility].  I will defer.  He agrees. Advance directive-wife and daughter equally designated if patient were incapacitated. Cognitive function addressed- see scanned forms- and if abnormal then additional documentation follows.

## 2020-09-25 DIAGNOSIS — Z961 Presence of intraocular lens: Secondary | ICD-10-CM | POA: Diagnosis not present

## 2020-10-03 DIAGNOSIS — G4733 Obstructive sleep apnea (adult) (pediatric): Secondary | ICD-10-CM | POA: Diagnosis not present

## 2020-11-12 DIAGNOSIS — G4733 Obstructive sleep apnea (adult) (pediatric): Secondary | ICD-10-CM | POA: Diagnosis not present

## 2020-12-05 DIAGNOSIS — Z8546 Personal history of malignant neoplasm of prostate: Secondary | ICD-10-CM | POA: Diagnosis not present

## 2020-12-12 DIAGNOSIS — Z8546 Personal history of malignant neoplasm of prostate: Secondary | ICD-10-CM | POA: Diagnosis not present

## 2020-12-17 IMAGING — DX DG CHEST 2V
2 series · 2 of 2 positions shown · non-contrast
Comparison: 09/05/2009

CLINICAL DATA: Preop bladder surgery, denies chest complaints.

EXAM:
CHEST - 2 VIEW

[chest pa]
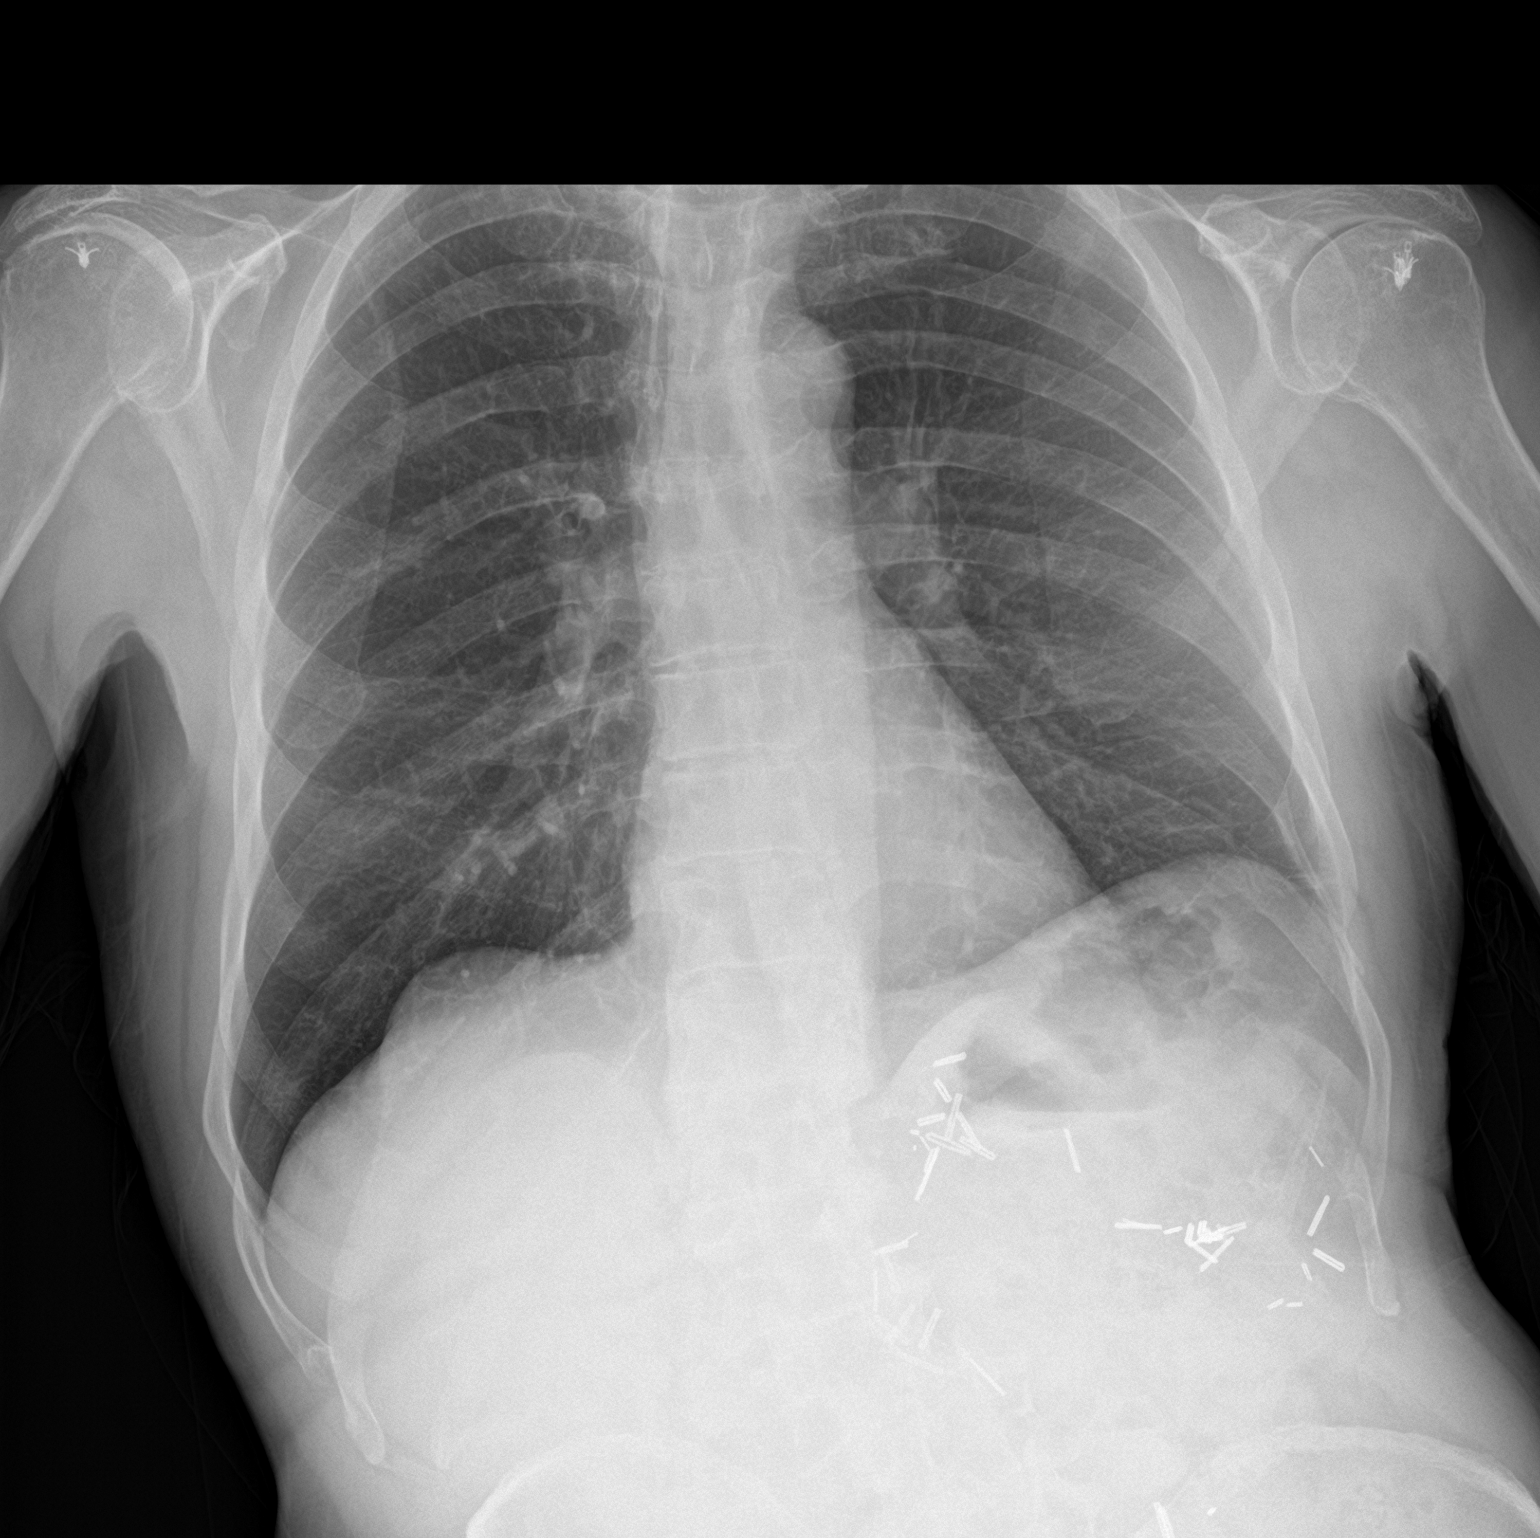

[chest lat]
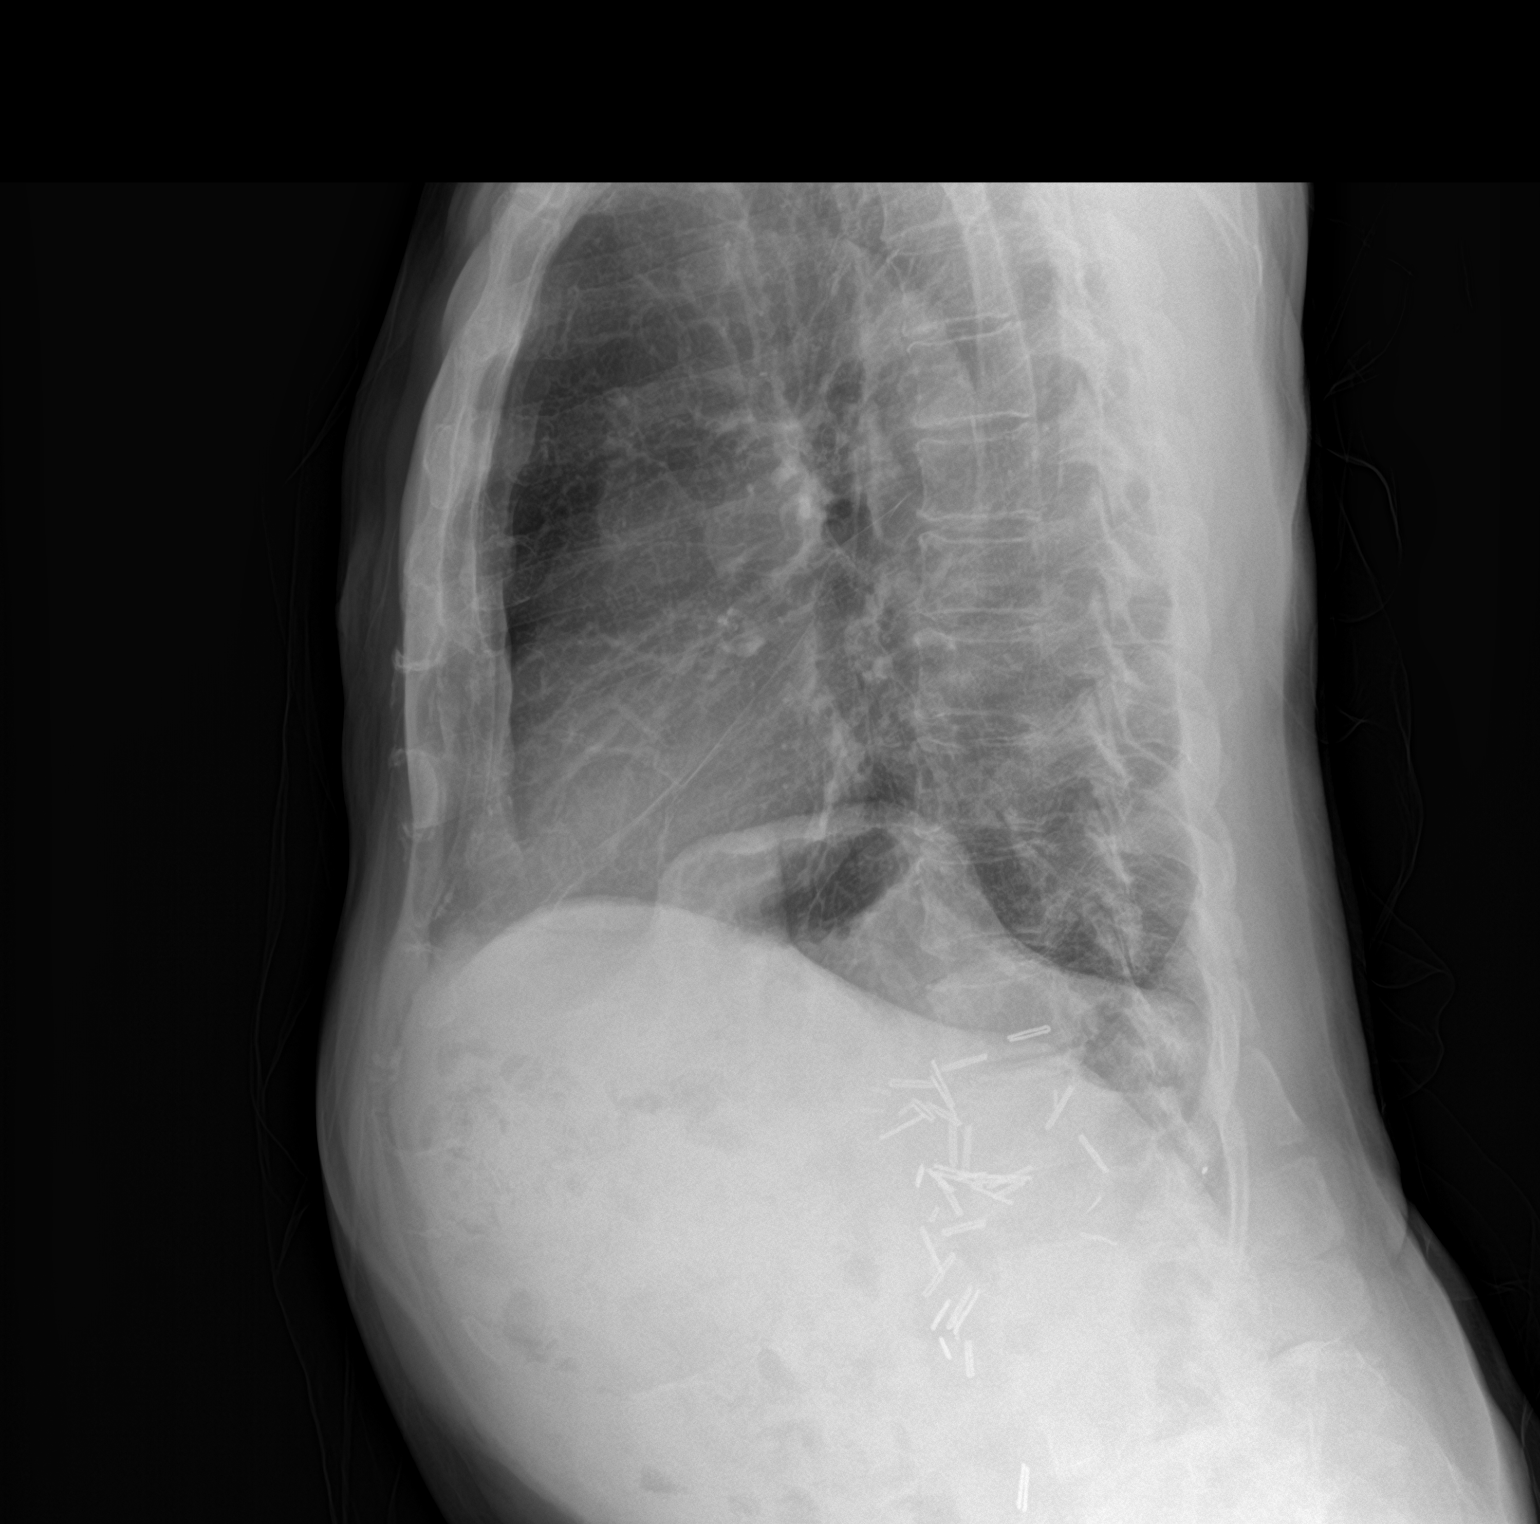

[2 of 2 positions shown; findings below may reference images not displayed]

FINDINGS: Heart size is normal. The lungs are free of focal consolidations and
pleural effusions. No pulmonary edema. Surgical clips are identified
in the LEFT UPPER QUADRANT of the abdomen. Previous shoulder surgery
bilaterally.
IMPRESSION: No active cardiopulmonary disease.

## 2021-06-05 DIAGNOSIS — C61 Malignant neoplasm of prostate: Secondary | ICD-10-CM | POA: Diagnosis not present

## 2021-06-12 DIAGNOSIS — Z8546 Personal history of malignant neoplasm of prostate: Secondary | ICD-10-CM | POA: Diagnosis not present

## 2021-07-05 DIAGNOSIS — G4733 Obstructive sleep apnea (adult) (pediatric): Secondary | ICD-10-CM | POA: Diagnosis not present

## 2021-07-17 ENCOUNTER — Other Ambulatory Visit: Payer: Self-pay

## 2021-07-17 ENCOUNTER — Encounter: Payer: Self-pay | Admitting: Pulmonary Disease

## 2021-07-17 ENCOUNTER — Ambulatory Visit: Payer: PPO | Admitting: Pulmonary Disease

## 2021-07-17 VITALS — BP 118/68 | HR 78 | Ht 64.0 in | Wt 141.2 lb

## 2021-07-17 DIAGNOSIS — J310 Chronic rhinitis: Secondary | ICD-10-CM

## 2021-07-17 DIAGNOSIS — Z9989 Dependence on other enabling machines and devices: Secondary | ICD-10-CM

## 2021-07-17 DIAGNOSIS — J3089 Other allergic rhinitis: Secondary | ICD-10-CM

## 2021-07-17 DIAGNOSIS — G4733 Obstructive sleep apnea (adult) (pediatric): Secondary | ICD-10-CM | POA: Diagnosis not present

## 2021-07-17 MED ORDER — AZELASTINE HCL 0.15 % NA SOLN: 1.0000 | Freq: Two times a day (BID) | NASAL | Status: DC | PRN

## 2021-07-17 NOTE — Patient Instructions (Signed)
Use saline nasal spray and flonase before going to bed.  If your sinus congestion and cough persist, then you can try using astepro twice per day.  Will have [facility] refit your CPAP mask.  Follow up in 1 year in [place] or [place] office.

## 2021-07-17 NOTE — Progress Notes (Signed)
[facility]  Chief Complaint  Patient presents with   Follow-up    51yrf/u for OSA. States he has been using his cpap every night. Uses [facility] as his DME.     Constitutional:  BP 118/68   Pulse 78   Ht '5\' 4"'$  (1.626 m)   Wt 141 lb 3.2 oz (64 kg)   SpO2 99% Comment: on RA  BMI 24.24 kg/m   Past Medical History:  Allergies, Bells palsy, CKD 3, ED, GERD, Renal cell carcinoma, HLD, Scoliosis, Prostate cancer  Past Surgical History:  He  has a past surgical history that includes Nephrectomy (Left, 10/1985); Laminectomy (1993); Shoulder open rotator cuff repair (12/1998); Shoulder open rotator cuff repair (Bilateral, 08/2001); Cataract extraction (Left, 06/10/2018); Cataract extraction w/ intraocular lens implant (Left); Radioactive seed implant (N/A, 10/21/2019); and SPACE OAR INSTILLATION (N/A, 10/21/2019).  Brief Summary:  Kenneth EMERINEis a 85y.o. male with  obstructive sleep apnea.      Subjective:   He uses CPAP nightly.  Has full face mask.  Causes irritation sometimes over the bridge of his nose.  Has sinus congestion and post nasal drip.  Has to cough up phlegm in the morning.  Not having wheeze, chest congestion, or chest discomfort.  Uses claritin in the morning.  Hasn't been using nasal irrigation or flonase recently.  Physical Exam:   Appearance - well kempt   ENMT - no sinus tenderness, no oral exudate, no LAN, Mallampati 3 airway, no stridor  Respiratory - equal breath sounds bilaterally, no wheezing or rales  CV - s1s2 regular rate and rhythm, no murmurs  Ext - no clubbing, no edema  Skin - no rashes  Psych - normal mood and affect   Sleep Tests:  HST 03/14/20 >> AHI 47.6, SpO2 low 69% Auto CPAP 06/16/21 to 07/15/21 >> used on 30 of 30 nights with average 7 hrs 15 min.  Average AHI 6 with median CPAP 10 cm H2O  Social History:  He  reports that he has never smoked. He has never used smokeless tobacco. He  reports that he does not drink alcohol and does not use drugs.  Family History:  His family history includes Arthritis in his mother; Cancer in his father and mother; Esophageal cancer in his paternal aunt; Fibromyalgia in his mother; Heart disease in his father; Lupus in his sister; Prostate cancer in his father; Stroke in his father and mother.     Assessment/Plan:   Obstructive sleep apnea. - he is compliant with CPAP and reports benefit from therapy - [facility] - will arrange for mask refitting - continue auto CPAP 5 to 10 cm H2O   Perennial rhinitis with CPAP rhinitis.  - will have him try nasal irrigation and flonase nightly - continue claritin in the morning - if symptoms persist, then he can add OTC astepro  Time Spent Involved in Patient Care on Day of Examination:  31 minutes  Follow up:   Patient Instructions  Use saline nasal spray and flonase before going to bed.  If your sinus congestion and cough persist, then you can try using astepro twice per day.  Will have [facility] refit your CPAP mask.  [place] [facility]   Medication List:   Allergies as of 07/17/2021       Reactions   Ibuprofen    Caution re: kidney function   Iodinated Diagnostic  Agents Nausea And Vomiting   IV dye        Medication List        Accurate as of July 17, 2021 11:02 AM. If you have any questions, ask your nurse or doctor.          aspirin 81 MG tablet Take 81 mg by mouth daily.   Azelastine HCl 0.15 % Soln Place 1 spray into the nose 2 (two) times daily as needed (Allergies). Started by: Chesley Mires, MD   Calcium 500-125 MG-UNIT Tabs Take 1 tablet by mouth daily.   calcium carbonate 500 MG chewable tablet Commonly known as: TUMS - dosed in mg elemental calcium Chew 1 tablet by mouth daily as needed for indigestion or heartburn.   diphenhydrAMINE 25 MG tablet Commonly known as: BENADRYL Take 25 mg by mouth  as needed.   fish oil-omega-3 fatty acids 1000 MG capsule Take 1 g by mouth daily.   fluticasone 50 MCG/ACT nasal spray Commonly known as: FLONASE Place 2 sprays into both nostrils daily.   loratadine 10 MG tablet Commonly known as: CLARITIN Take 10 mg by mouth daily.   metoprolol tartrate 25 MG tablet Commonly known as: LOPRESSOR Take 0.5-1 tablets (12.5-25 mg total) by mouth 2 (two) times daily as needed.   multivitamin capsule Take 1 capsule by mouth daily.   sildenafil 20 MG tablet Commonly known as: REVATIO TAKE 3 TO 5 TABLETS BY MOUTH ONCE DAILY AS NEEDED   simvastatin 40 MG tablet Commonly known as: Zocor Take 1 tablet (40 mg total) by mouth every evening.   Vitamin D 1000 units capsule Take 1,000 Units by mouth daily.        Signature:  Chesley Mires, MD [place] Pager - [phone] 07/17/2021, 11:02 AM

## 2021-08-07 DIAGNOSIS — G4733 Obstructive sleep apnea (adult) (pediatric): Secondary | ICD-10-CM | POA: Diagnosis not present

## 2021-08-09 ENCOUNTER — Other Ambulatory Visit: Payer: Self-pay | Admitting: Family Medicine

## 2021-09-01 ENCOUNTER — Other Ambulatory Visit: Payer: Self-pay | Admitting: Family Medicine

## 2021-09-01 DIAGNOSIS — E7800 Pure hypercholesterolemia, unspecified: Secondary | ICD-10-CM

## 2021-09-04 ENCOUNTER — Other Ambulatory Visit: Payer: PPO

## 2021-09-09 ENCOUNTER — Other Ambulatory Visit: Payer: Self-pay

## 2021-09-09 ENCOUNTER — Ambulatory Visit (INDEPENDENT_AMBULATORY_CARE_PROVIDER_SITE_OTHER): Payer: PPO | Admitting: Family Medicine

## 2021-09-09 ENCOUNTER — Encounter: Payer: Self-pay | Admitting: Family Medicine

## 2021-09-09 VITALS — BP 122/80 | HR 70 | Temp 98.0°F | Ht 64.0 in | Wt 141.0 lb

## 2021-09-09 DIAGNOSIS — R000 Tachycardia, unspecified: Secondary | ICD-10-CM | POA: Diagnosis not present

## 2021-09-09 DIAGNOSIS — R0989 Other specified symptoms and signs involving the circulatory and respiratory systems: Secondary | ICD-10-CM

## 2021-09-09 DIAGNOSIS — L570 Actinic keratosis: Secondary | ICD-10-CM

## 2021-09-09 DIAGNOSIS — Z7189 Other specified counseling: Secondary | ICD-10-CM

## 2021-09-09 DIAGNOSIS — E7800 Pure hypercholesterolemia, unspecified: Secondary | ICD-10-CM | POA: Diagnosis not present

## 2021-09-09 DIAGNOSIS — Z0000 Encounter for general adult medical examination without abnormal findings: Secondary | ICD-10-CM

## 2021-09-09 DIAGNOSIS — G5600 Carpal tunnel syndrome, unspecified upper limb: Secondary | ICD-10-CM

## 2021-09-09 DIAGNOSIS — M4120 Other idiopathic scoliosis, site unspecified: Secondary | ICD-10-CM | POA: Diagnosis not present

## 2021-09-09 DIAGNOSIS — N529 Male erectile dysfunction, unspecified: Secondary | ICD-10-CM | POA: Diagnosis not present

## 2021-09-09 DIAGNOSIS — Z23 Encounter for immunization: Secondary | ICD-10-CM

## 2021-09-09 DIAGNOSIS — N289 Disorder of kidney and ureter, unspecified: Secondary | ICD-10-CM | POA: Diagnosis not present

## 2021-09-09 DIAGNOSIS — C61 Malignant neoplasm of prostate: Secondary | ICD-10-CM

## 2021-09-09 LAB — LIPID PANEL
Cholesterol: 146 mg/dL (ref 0–200)
HDL: 37.1 mg/dL — ABNORMAL LOW (ref >39.00)
LDL Cholesterol: 84 mg/dL (ref 0–99)
NonHDL: 109.34
Total CHOL/HDL Ratio: 4
Triglycerides: 126 mg/dL (ref 0.0–149.0)
VLDL: 25.2 mg/dL (ref 0.0–40.0)

## 2021-09-09 LAB — COMPREHENSIVE METABOLIC PANEL
ALT: 26 U/L (ref 0–53)
AST: 21 U/L (ref 0–37)
Albumin: 4.6 g/dL (ref 3.5–5.2)
Alkaline Phosphatase: 58 U/L (ref 39–117)
BUN: 27 mg/dL — ABNORMAL HIGH (ref 6–23)
CO2: 30 mEq/L (ref 19–32)
Calcium: 9.8 mg/dL (ref 8.4–10.5)
Chloride: 105 mEq/L (ref 96–112)
Creatinine, Ser: 1.67 mg/dL — ABNORMAL HIGH (ref 0.40–1.50)
GFR: 37.8 mL/min — ABNORMAL LOW (ref >60.00)
Glucose, Bld: 97 mg/dL (ref 70–99)
Potassium: 4.5 mEq/L (ref 3.5–5.1)
Sodium: 142 mEq/L (ref 135–145)
Total Bilirubin: 0.5 mg/dL (ref 0.2–1.2)
Total Protein: 7.1 g/dL (ref 6.0–8.3)

## 2021-09-09 MED ORDER — SILDENAFIL CITRATE 20 MG PO TABS: ORAL_TABLET | ORAL | 99 refills | Status: DC

## 2021-09-09 MED ORDER — OMEPRAZOLE 20 MG PO CPDR: 20.0000 mg | DELAYED_RELEASE_CAPSULE | Freq: Every day | ORAL | Status: DC

## 2021-09-09 MED ORDER — SIMVASTATIN 40 MG PO TABS: 40.0000 mg | ORAL_TABLET | Freq: Every evening | ORAL | 3 refills | Status: DC

## 2021-09-09 NOTE — Progress Notes (Signed)
This visit occurred during the SARS-CoV-2 public health emergency.  Safety protocols were in place, including screening questions prior to the visit, additional usage of staff PPE, and extensive cleaning of exam room while observing appropriate contact time as indicated for disinfecting solutions.  I have personally reviewed the Medicare Annual Wellness questionnaire and have noted 1. The patient's medical and social history 2. Their use of alcohol, tobacco or illicit drugs 3. Their current medications and supplements 4. The patient's functional ability including ADL's, fall risks, home safety risks and hearing or visual             impairment. 5. Diet and physical activities 6. Evidence for depression or mood disorders  The patients weight, height, BMI have been recorded in the chart and visual acuity is per eye clinic.  I have made referrals, counseling and provided education to the patient based review of the above and I have provided the pt with a written personalized care plan for preventive services.  Provider list updated- see scanned forms.  Routine anticipatory guidance given to patient.  See health maintenance. The possibility exists that previously documented standard health maintenance information may have been brought forward from a previous encounter into this note.  If needed, that same information has been updated to reflect the current situation based on today's encounter.    Flu 2022 Shingles up-to-date PNA up-to-date Tetanus 2018 COVID vaccine discussed with patient. Colon cancer screening not due given his age.  He agrees per [facility].  I will defer.  He agrees. Advance directive-wife and daughter equally designated if patient were incapacitated. Cognitive function addressed- see scanned forms- and if abnormal then additional documentation follows.    In addition to [facility] Wellness, follow up visit for the below conditions:  Elevated Cholesterol: Using  medications without problems: yes Muscle aches: not from statin, d/w pt.   Diet compliance: yes Exercise: yes, walking daily.   Labs pending.     He isn't lightheaded.  still no recent metoprolol use.  No CP. No SOB.  No BLE edema.  Pulse is usually <100.   ED.  Sildenafil worked w/o ADE.  D/w pt.  no NTG use.  Routine cautions d/w pt.     H/o Cr elevation.  H/o nephrectomy.  D/w pt about avoiding nsaids.  Labs pending.    He has meralgia paresthetica on the R side, going on for about 3 years.  No weakness.     Prostate cancer per urology s/p seed implant.  PSA still decreasing and he'll f/u with urology.  D/w pt.     Scoliosis at baseline.  He is putting up with that.  His back still gets "tired" but he is able to rest for a little and then keep moving during the day.  "It's been like that for a long time."    L>>R carpal tunnel sx.  Bothersome when driving.  He has tingling and numbness and occ pain.  Can radiate up the arm.  No weakness.  He has already been using a brace at night.  That helped some.    Throat clearing.  He has post nasal gtt and nasal spray helps some.  Using flonase, claritin and azelastin.  He noted eating dry stringy meat he'll occ having sticking mid-esophagus. No vomiting, no blood in stool.     PMH and SH reviewed  Meds, vitals, and allergies reviewed.   ROS: Per HPI.  Unless specifically indicated otherwise in HPI, the patient denies:  General:  fever. Eyes: acute vision changes ENT: sore throat Cardiovascular: chest pain Respiratory: SOB GI: vomiting GU: dysuria Musculoskeletal: acute back pain Derm: acute rash Neuro: acute motor dysfunction Psych: worsening mood Endocrine: polydipsia Heme: bleeding Allergy: hayfever  GEN: nad, alert and oriented HEENT: ncat, MMM NECK: supple w/o LA CV: rrr. PULM: ctab, no inc wob ABD: soft, +bs EXT: no edema SKIN: no acute rash but small AK noted on the R upper ear, d/w pt about options and he consents  for liq N2 tx.  Tx'd x3 with freeze thaw cycles.   R thigh numbness noted.  Scoliosis noted at baseline.   S/S wnl BUE.

## 2021-09-09 NOTE — Patient Instructions (Addendum)
Go to the lab on the way out.   If you have mychart we'll likely use that to update you.    Take care.  Glad to see you.  Try omeprazole for 2 weeks and see if that helps with throat clearing and swallowing. If not better, then let me know.    Think about seeing Dr. Lorelei Pont about your hand.

## 2021-09-11 DIAGNOSIS — G5600 Carpal tunnel syndrome, unspecified upper limb: Secondary | ICD-10-CM | POA: Insufficient documentation

## 2021-09-11 DIAGNOSIS — L570 Actinic keratosis: Secondary | ICD-10-CM | POA: Insufficient documentation

## 2021-09-11 DIAGNOSIS — R0989 Other specified symptoms and signs involving the circulatory and respiratory systems: Secondary | ICD-10-CM | POA: Insufficient documentation

## 2021-09-11 NOTE — Assessment & Plan Note (Signed)
Sildenafil worked w/o ADE.  D/w pt.  no NTG use.  Routine cautions d/w pt.

## 2021-09-11 NOTE — Assessment & Plan Note (Signed)
Advance directive-wife and daughter equally designated if patient were incapacitated.

## 2021-09-11 NOTE — Assessment & Plan Note (Signed)
Per urology.  I will defer.  He agrees to plan.

## 2021-09-11 NOTE — Assessment & Plan Note (Signed)
We talked about trial of omeprazole for 2 weeks and then he'll update me as needed.

## 2021-09-11 NOTE — Assessment & Plan Note (Signed)
  H/o Cr elevation.  H/o nephrectomy.  D/w pt about avoiding nsaids.  Labs pending.

## 2021-09-11 NOTE — Assessment & Plan Note (Signed)
He is putting up with his back symptoms.  His back gets "tired" but he is able to keep moving after period of rest.  He will update me as needed.

## 2021-09-11 NOTE — Assessment & Plan Note (Addendum)
Frozen and thawed x3 with liquid nitrogen after getting consent from patient.  Routine postprocedure instructions given to patient.  Tolerated well.  No complications.

## 2021-09-11 NOTE — Assessment & Plan Note (Signed)
Flu 2022 Shingles up-to-date PNA up-to-date Tetanus 2018 COVID vaccine discussed with patient. Colon cancer screening not due given his age.  He agrees per [facility].  I will defer.  He agrees. Advance directive-wife and daughter equally designated if patient were incapacitated. Cognitive function addressed- see scanned forms- and if abnormal then additional documentation follows.

## 2021-09-11 NOTE — Assessment & Plan Note (Signed)
Noted in spite of using a brace and I asked him to follow-up with Dr. Lorelei Pont.

## 2021-09-11 NOTE — Assessment & Plan Note (Signed)
Continue simvastatin.  See notes on labs. 

## 2021-09-11 NOTE — Assessment & Plan Note (Signed)
He isn't lightheaded.  still no recent metoprolol use.  No CP. No SOB.  No BLE edema.  Pulse is usually <100.  would continue prn use of metoprolol.

## 2021-09-12 DIAGNOSIS — G4733 Obstructive sleep apnea (adult) (pediatric): Secondary | ICD-10-CM | POA: Diagnosis not present

## 2021-10-03 DIAGNOSIS — Z961 Presence of intraocular lens: Secondary | ICD-10-CM | POA: Diagnosis not present

## 2021-10-07 DIAGNOSIS — D23112 Other benign neoplasm of skin of right lower eyelid, including canthus: Secondary | ICD-10-CM | POA: Diagnosis not present

## 2021-10-29 ENCOUNTER — Telehealth: Payer: Self-pay | Admitting: Family Medicine

## 2021-10-29 NOTE — Chronic Care Management (AMB) (Signed)
  Chronic Care Management   Note  10/29/2021 Name: Kenneth Ross MRN: [id number] DOB: 10-14-1936  Kenneth Ross is a 85 y.o. year old male who is a primary care patient of Tonia Ghent, MD. I reached out to [name] by phone today in response to a referral sent by Kenneth Ross PCP, Tonia Ghent, MD.   Kenneth Ross was given information about Chronic Care Management services today including:  CCM service includes personalized support from designated clinical staff supervised by his physician, including individualized plan of care and coordination with other care providers 24/7 contact phone numbers for assistance for urgent and routine care needs. Service will only be billed when office clinical staff spend 20 minutes or more in a month to coordinate care. Only one practitioner may furnish and bill the service in a calendar month. The patient may stop CCM services at any time (effective at the end of the month) by phone call to the office staff.   Patient agreed to services and verbal consent obtained.   Follow up plan:   [name]

## 2021-11-07 DIAGNOSIS — G4733 Obstructive sleep apnea (adult) (pediatric): Secondary | ICD-10-CM | POA: Diagnosis not present

## 2021-12-09 DIAGNOSIS — G4733 Obstructive sleep apnea (adult) (pediatric): Secondary | ICD-10-CM | POA: Diagnosis not present

## 2021-12-11 DIAGNOSIS — C61 Malignant neoplasm of prostate: Secondary | ICD-10-CM | POA: Diagnosis not present

## 2021-12-16 ENCOUNTER — Telehealth: Payer: PPO

## 2022-01-07 ENCOUNTER — Encounter: Payer: Self-pay | Admitting: Gastroenterology

## 2022-01-10 ENCOUNTER — Telehealth: Payer: Self-pay

## 2022-01-10 NOTE — Progress Notes (Signed)
° ° °  Chronic Care Management Pharmacy Assistant   Name: Kenneth Ross  MRN: [id number] DOB: 07/06/1936  Reason for Encounter: CCM (Initial Questions)   Recent office visits:  09/09/2021 - Elsie Stain, MD - Patient presented for Annual Wellness Visit. Labs: CMP and Lipid. Start: omeprazole (PRILOSEC) 20 MG capsule - 1 daily. Stop due to patient not taking: diphenhydrAMINE (BENADRYL) 25 MG tablet.   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  10/03/2021 - Jola Schmidt, Ophthalmology - Patient presented for intraocular lens.  07/17/2021 - Chesley Mires, MD - Pulmonology - Patient presented for obstructive sleep apnea. Referral for DME for CPAP mask refitting. Start: Azelastine HCl 0.15 % SOLN.  Hospital visits:  None in previous 6 months  Medications: Outpatient Encounter Medications as of 01/10/2022  Medication Sig   aspirin 81 MG tablet Take 81 mg by mouth daily.     Azelastine HCl 0.15 % SOLN Place 1 spray into the nose 2 (two) times daily as needed (Allergies).   Calcium 500-125 MG-UNIT TABS Take 1 tablet by mouth daily.     calcium carbonate (TUMS - DOSED IN MG ELEMENTAL CALCIUM) 500 MG chewable tablet Chew 1 tablet by mouth daily as needed for indigestion or heartburn.   Cholecalciferol (VITAMIN D) 1000 UNITS capsule Take 1,000 Units by mouth daily.     fish oil-omega-3 fatty acids 1000 MG capsule Take 1 g by mouth daily.    fluticasone (FLONASE) 50 MCG/ACT nasal spray Place 2 sprays into both nostrils daily.   loratadine (CLARITIN) 10 MG tablet Take 10 mg by mouth daily.    metoprolol tartrate (LOPRESSOR) 25 MG tablet Take 0.5-1 tablets (12.5-25 mg total) by mouth 2 (two) times daily as needed.   Multiple Vitamin (MULTIVITAMIN) capsule Take 1 capsule by mouth daily.     omeprazole (PRILOSEC) 20 MG capsule Take 1 capsule (20 mg total) by mouth daily.   sildenafil (REVATIO) 20 MG  tablet TAKE 3 TO 5 TABLETS BY MOUTH ONCE DAILY AS NEEDED   simvastatin (ZOCOR) 40 MG tablet Take 1 tablet (40 mg total) by mouth every evening.   No facility-administered encounter medications on file as of 01/10/2022.   Lab Results  Component Value Date/Time   HGBA1C 5.8 08/06/2007 12:00 AM   MICROALBUR 0.2 07/18/2011 08:52 AM   MICROALBUR 0.5 05/16/2010 10:21 AM    BP Readings from Last 3 Encounters:  09/09/21 122/80  07/17/21 118/68  09/06/20 (!) 102/58   Patient contacted to review initial questions prior to visit with Charlene Brooke. Patient declined CCM services.   Star Rating Drugs:  Medication:  Last Fill: Day Supply Simvastatin 40 mg 11/22/2021 90  Care Gaps: Annual wellness visit in last year? Yes 09/09/2021 Most Recent BP reading: 122/80 on 09/09/2021  Charlene Brooke, CPP notified  Marijean Niemann, [place] Pharmacy Assistant [phone]

## 2022-01-14 ENCOUNTER — Telehealth: Payer: PPO

## 2022-01-15 DIAGNOSIS — G4733 Obstructive sleep apnea (adult) (pediatric): Secondary | ICD-10-CM | POA: Diagnosis not present

## 2022-01-22 DIAGNOSIS — C61 Malignant neoplasm of prostate: Secondary | ICD-10-CM | POA: Diagnosis not present

## 2022-01-29 ENCOUNTER — Other Ambulatory Visit: Payer: Self-pay

## 2022-01-29 ENCOUNTER — Encounter: Payer: Self-pay | Admitting: Family Medicine

## 2022-01-29 ENCOUNTER — Ambulatory Visit (INDEPENDENT_AMBULATORY_CARE_PROVIDER_SITE_OTHER): Payer: PPO | Admitting: Family Medicine

## 2022-01-29 VITALS — BP 110/62 | HR 81 | Temp 98.0°F | Ht 64.0 in | Wt 147.1 lb

## 2022-01-29 DIAGNOSIS — M19042 Primary osteoarthritis, left hand: Secondary | ICD-10-CM

## 2022-01-29 DIAGNOSIS — G5602 Carpal tunnel syndrome, left upper limb: Secondary | ICD-10-CM | POA: Diagnosis not present

## 2022-01-29 DIAGNOSIS — G5601 Carpal tunnel syndrome, right upper limb: Secondary | ICD-10-CM | POA: Diagnosis not present

## 2022-01-29 DIAGNOSIS — M19041 Primary osteoarthritis, right hand: Secondary | ICD-10-CM | POA: Diagnosis not present

## 2022-01-29 MED ORDER — TRIAMCINOLONE ACETONIDE 40 MG/ML IJ SUSP
20.0000 mg | Freq: Once | INTRAMUSCULAR | Status: AC
  Administered 2022-01-29: 20 mg via INTRA_ARTICULAR

## 2022-01-29 NOTE — Progress Notes (Signed)
? ? ?Kenneth Gossen T. Van Seymore, MD, [facility] ?Therapist, music at [facility] ?[place] ?[place], [address] ? ?Phone: [phone]  FAX: [phone] ? ?Kenneth Ross - 86 y.o. male  MRN [id number]  Date of Birth: December 11, 1935 ? ?Date: 01/29/2022  PCP: Tonia Ghent, MD  Referral: Tonia Ghent, MD ? ?Chief Complaint  ?Patient presents with  ? Hand Pain  ?  Bilateral but Left is worse with numbness ?Wears brace at night which helps  ? ? ?This visit occurred during the SARS-CoV-2 public health emergency.  Safety protocols were in place, including screening questions prior to the visit, additional usage of staff PPE, and extensive cleaning of exam room while observing appropriate contact time as indicated for disinfecting solutions.  ? ?Subjective:  ? ?Kenneth Ross is a 86 y.o. very pleasant male patient with Body mass index is 25.25 kg/m?. who presents with the following: ? ?B hand pain and numbness: He presents with some longstanding bilateral hand pain and with some numbness in the first through third digits and palm that has been present for worsening over years.  He does wear some bilateral splints, and while these have helped some in the past these are not really helping at all much right now.  Does have some tingling at baseline. ? ?He does also have some aching in multiple joints of the hand. ? ?Review of Systems is noted in the HPI, as appropriate  ? ?Objective:  ? ?BP 110/62   Pulse 81   Temp 98 ?F (36.7 ?C) (Oral)   Ht 5\' 4"  (1.626 m)   Wt 147 lb 2 oz (66.7 kg)   SpO2 98%   BMI 25.25 kg/m?  ? ? ?B hand ?Ecchymosis or edema: neg ?ROM wrist/hand/digits: full  ?Carpals, MCP's, digits: Heberden's nodes throughout and some digits do have painful joints  ?distal Ulna and Radius: NT ?Ecchymosis or edema: neg ?No instability ?Cysts/nodules: neg ?Digit triggering: neg ?Finkelstein's test: neg ?Snuffbox tenderness: neg ?Scaphoid tubercle: NT ?Resisted supination:  NT ?Full composite fist, no malrotation ?Grip, all digits: 5/5 str ?No tenosynovitis ?Axial load test: neg ?Phalen's: Positive ?Tinel's: Positive ?Atrophy: neg ? ?Hand sensation: intact  ? ?Radiology: ?No results found. ? ?Assessment and Plan:  ? ?  ICD-10-CM   ?1. Left carpal tunnel syndrome  G56.02 triamcinolone acetonide (KENALOG-40) injection 20 mg  ?  ?2. Carpal tunnel syndrome, right  G56.01   ?  ?3. Localized primary osteoarthritis of both hands  M19.041   ? M09.470   ?  ? ?The primary issue here is carpal tunnel syndrome, acute on chronic with exacerbation.  This is notably left greater than right.  He is having some significant symptoms with worsening over time, and he has been taking and using some splints at night a lot of the time. ? ?He also has diffuse osteoarthritic change in his hands, and this also does cause him some pain.  I think that we can watch this. ? ?We discussed the anatomy involved, and that carpal tunnel syndrome primarily involves the median nerve, and this typically affects digits one through 3.  ? ?If the patient does have moderate to severe carpal tunnel syndrome based on NCV, then it is certainly reasonable to consider surgical consultation for definitive management possible carpal tunnel release.  We also discussed his severe carpal tunnel syndrome can lead to permanent nerve impairment even if released. At this point, the patient like to proceed conservatively.  ? ?Carpal Tunnel  Injection Procedure Note ?Kenneth Ross ?Jul 26, 1936 ?Date of procedure: 01/29/2022 ? ?Procedure: Carpal Tunnel Injection, L ?Indications: Numbness ? ?Procedure Details ?Verbal consent was obtained. Risks, benefits, and alternatives were discussed. Prepped with Chloraprep and Ethyl Chloride used for anesthesia. Under sterile conditions,  the patient was injected just ulnar to the palmaris longus tendon at the wrist flexion crease.  The needle was inserted at 45 degree angle aiming distally. Aspiration  showed no blood. Medication flowed freely without resistance.  ?Needle size: 22 gauge 1 1/2 inch ?Injection: 1/2 cc of Lidocaine 1% and Kenalog 20 mg ?Medication: 1/2 cc of Kenalog 40 mg (equaling Kenalog 20 mg)  ? ?Meds ordered this encounter  ?Medications  ? triamcinolone acetonide (KENALOG-40) injection 20 mg  ? ?There are no discontinued medications. ?No orders of the defined types were placed in this encounter. ? ? ?Follow-up: No follow-ups on file. ? ?Dragon [employer] One speech-to-text software was used for transcription in this dictation.  Possible transcriptional errors can occur using Editor, commissioning.  ? ?Signed, ? ?Arshdeep Bolger T. Maudene Stotler, MD ? ? ?Outpatient Encounter Medications as of 01/29/2022  ?Medication Sig  ? aspirin 81 MG tablet Take 81 mg by mouth daily.    ? Azelastine HCl 0.15 % SOLN Place 1 spray into the nose 2 (two) times daily as needed (Allergies).  ? Calcium 500-125 MG-UNIT TABS Take 1 tablet by mouth daily.    ? calcium carbonate (TUMS - DOSED IN MG ELEMENTAL CALCIUM) 500 MG chewable tablet Chew 1 tablet by mouth daily as needed for indigestion or heartburn.  ? Cholecalciferol (VITAMIN D) 1000 UNITS capsule Take 1,000 Units by mouth daily.    ? fish oil-omega-3 fatty acids 1000 MG capsule Take 1 g by mouth daily.   ? fluticasone (FLONASE) 50 MCG/ACT nasal spray Place 2 sprays into both nostrils daily.  ? loratadine (CLARITIN) 10 MG tablet Take 10 mg by mouth daily.   ? metoprolol tartrate (LOPRESSOR) 25 MG tablet Take 0.5-1 tablets (12.5-25 mg total) by mouth 2 (two) times daily as needed.  ? Multiple Vitamin (MULTIVITAMIN) capsule Take 1 capsule by mouth daily.    ? omeprazole (PRILOSEC) 20 MG capsule Take 1 capsule (20 mg total) by mouth daily.  ? sildenafil (REVATIO) 20 MG tablet TAKE 3 TO 5 TABLETS BY MOUTH ONCE DAILY AS NEEDED  ? simvastatin (ZOCOR) 40 MG tablet Take 1 tablet (40 mg total) by mouth every evening.  ? [EXPIRED] triamcinolone acetonide (KENALOG-40) injection 20 mg   ? ?No  facility-administered encounter medications on file as of 01/29/2022.  ?  ?

## 2022-02-03 ENCOUNTER — Encounter: Payer: Self-pay | Admitting: Gastroenterology

## 2022-02-14 DIAGNOSIS — G4733 Obstructive sleep apnea (adult) (pediatric): Secondary | ICD-10-CM | POA: Diagnosis not present

## 2022-02-27 ENCOUNTER — Ambulatory Visit: Payer: PPO | Admitting: Gastroenterology

## 2022-02-27 ENCOUNTER — Encounter: Payer: Self-pay | Admitting: Gastroenterology

## 2022-02-27 VITALS — BP 114/68 | HR 75 | Ht 63.0 in | Wt 143.0 lb

## 2022-02-27 DIAGNOSIS — Z1211 Encounter for screening for malignant neoplasm of colon: Secondary | ICD-10-CM | POA: Diagnosis not present

## 2022-02-27 DIAGNOSIS — R1011 Right upper quadrant pain: Secondary | ICD-10-CM

## 2022-02-27 DIAGNOSIS — Z1212 Encounter for screening for malignant neoplasm of rectum: Secondary | ICD-10-CM

## 2022-02-27 DIAGNOSIS — K219 Gastro-esophageal reflux disease without esophagitis: Secondary | ICD-10-CM

## 2022-02-27 DIAGNOSIS — R1310 Dysphagia, unspecified: Secondary | ICD-10-CM | POA: Diagnosis not present

## 2022-02-27 MED ORDER — NA SULFATE-K SULFATE-MG SULF 17.5-3.13-1.6 GM/177ML PO SOLN: 1.0000 | Freq: Once | ORAL | 0 refills | Status: AC

## 2022-02-27 NOTE — Patient Instructions (Signed)
You have been scheduled for an abdominal ultrasound at [facility] (1st floor of hospital) on 03/06/22 at 10:00am. Please arrive 15 minutes prior to your appointment for registration. Make certain not to have anything to eat or drink 6 hours prior to your appointment. Should you need to reschedule your appointment, please contact radiology at [phone]. This test typically takes about 30 minutes to perform. ? ?You have been scheduled for an endoscopy and colonoscopy. Please follow the written instructions given to you at your visit today. ?Please pick up your prep supplies at the pharmacy within the next 1-3 days. ?If you use inhalers (even only as needed), please bring them with you on the day of your procedure. ? ?The [facility] providers would like to encourage you to use [facility] to communicate with providers for non-urgent requests or questions.  Due to long hold times on the telephone, sending your provider a message by [facility] may be a faster and more efficient way to get a response.  Please allow 48 business hours for a response.  Please remember that this is for non-urgent requests.  ? ?Due to recent changes in healthcare laws, you may see the results of your imaging and laboratory studies on MyChart before your provider has had a chance to review them.  We understand that in some cases there may be results that are confusing or concerning to you. Not all laboratory results come back in the same time frame and the provider may be waiting for multiple results in order to interpret others.  Please give [place] 48 hours in order for your provider to thoroughly review all the results before contacting the office for clarification of your results.  ? ?Thank you for choosing me and [facility]. ? ?Malcolm T. Dagoberto Ligas., MD., [facility] ? ?

## 2022-02-27 NOTE — Progress Notes (Signed)
? ? ?History of Present Illness: This is an 86 year old male referred by Tonia Ghent, MD for the evaluation of constipation, dysphagia and RUQ pain.  He relates constipation for a few years that has gradually worsened.  He has been taking Metamucil daily and recently started Colace daily as well without a change in symptoms.  He relates hard stools and straining a few days per week.  He has long-term GERD and reflux symptoms are well controlled on daily omeprazole.  He has had intermittent nonprogressive solid food dysphagia mainly to chicken and meat.  In addition he has rare episodes of mild postprandial right upper quadrant pain forced several months. Denies weight loss, diarrhea, change in stool caliber, melena, hematochezia, nausea, vomiting, chest pain. ? ? ?Colonoscopy 09/2011 Mild left sided diverticulosis, otherwise negative  ? ? ? ?Allergies  ?Allergen Reactions  ? Ibuprofen   ?  Caution re: kidney function  ? Iodinated Contrast Media Nausea And Vomiting  ?  IV dye  ? ?Outpatient Medications Prior to Visit  ?Medication Sig Dispense Refill  ? aspirin 81 MG tablet Take 81 mg by mouth daily.      ? Azelastine HCl 0.15 % SOLN Place 1 spray into the nose 2 (two) times daily as needed (Allergies).    ? Calcium 500-125 MG-UNIT TABS Take 1 tablet by mouth daily.      ? calcium carbonate (TUMS - DOSED IN MG ELEMENTAL CALCIUM) 500 MG chewable tablet Chew 1 tablet by mouth daily as needed for indigestion or heartburn.    ? Cholecalciferol (VITAMIN D) 1000 UNITS capsule Take 1,000 Units by mouth daily.      ? fish oil-omega-3 fatty acids 1000 MG capsule Take 1 g by mouth daily.     ? fluticasone (FLONASE) 50 MCG/ACT nasal spray Place 2 sprays into both nostrils daily.    ? loratadine (CLARITIN) 10 MG tablet Take 10 mg by mouth daily.     ? metoprolol tartrate (LOPRESSOR) 25 MG tablet Take 0.5-1 tablets (12.5-25 mg total) by mouth 2 (two) times daily as needed. 180 tablet 3  ? Multiple Vitamin (MULTIVITAMIN)  capsule Take 1 capsule by mouth daily.      ? omeprazole (PRILOSEC) 20 MG capsule Take 1 capsule (20 mg total) by mouth daily.    ? sildenafil (REVATIO) 20 MG tablet TAKE 3 TO 5 TABLETS BY MOUTH ONCE DAILY AS NEEDED 50 tablet prn  ? simvastatin (ZOCOR) 40 MG tablet Take 1 tablet (40 mg total) by mouth every evening. 90 tablet 3  ? ?No facility-administered medications prior to visit.  ? ?Past Medical History:  ?Diagnosis Date  ? Allergic rhinitis, seasonal   ? Bell's palsy   ? with L eyelid droop at baseline as of 2017  ? Cancer of kidney ([place])   ? CKD (chronic kidney disease), stage III ([place])   ? followed by pcp  ? ED (erectile dysfunction)   ? GERD (gastroesophageal reflux disease)   ? occasional take tums  ? History of renal cell carcinoma   ? 1986  s/p  left nephrectomy  ? Hyperlipidemia   ? Idiopathic scoliosis   ? Pneumonia   ? Prostate cancer [facility]) urologist-- dr dahlstedt/  oncologist-- dr [name]  ? dx 09-07-2019--- Stage T1c,  Gleason 4+3  ? Seasonal allergies   ? Sleep apnea   ? Cpap  ? Solitary right kidney   ? s/p  left nephrecotmy 1986  ? Tachycardia followed by pcp  ? HR  100 @ pcp office --- pt referred to cardiology for consult w/ dr t. Rockey Situ note in epic 12-14-2017, by pcp (dr Damita Dunnings) no work up done,  pt has lopressor as needed if heart rate is elevated as pt feels appropiate  ? ?Past Surgical History:  ?Procedure Laterality Date  ? CATARACT EXTRACTION Left 06/10/2018  ? Dr. Valetta Close  ? CATARACT EXTRACTION Right 2020-02-21  ? CATARACT EXTRACTION W/ INTRAOCULAR LENS IMPLANT Left   ? LAMINECTOMY  1993  ? L3-5  ? NEPHRECTOMY Left 10/1985  ? RADIOACTIVE SEED IMPLANT N/A 10/21/2019  ? Procedure: RADIOACTIVE SEED IMPLANT/BRACHYTHERAPY IMPLANT;  Surgeon: Franchot Gallo, MD;  Location: [facility];  Service: Urology;  Laterality: N/A;  65 seeds implanted  ? SHOULDER OPEN ROTATOR CUFF REPAIR  12/1998  ? Right  ? SHOULDER OPEN ROTATOR CUFF REPAIR Bilateral 08/2001  ? SPACE OAR INSTILLATION N/A  10/21/2019  ? Procedure: SPACE OAR INSTILLATION;  Surgeon: Franchot Gallo, MD;  Location: [facility];  Service: Urology;  Laterality: N/A;  ? ?Social History  ? ?Socioeconomic History  ? Marital status: Married  ?  Spouse name: Not on file  ? Number of children: 2  ? Years of education: Not on file  ? Highest education level: Not on file  ?Occupational History  ? Occupation: Retired  ?Tobacco Use  ? Smoking status: Never  ? Smokeless tobacco: Never  ?Vaping Use  ? Vaping Use: Never used  ?Substance and Sexual Activity  ? Alcohol use: No  ?  Alcohol/week: 0.0 standard drinks  ? Drug use: Never  ? Sexual activity: Yes  ?Other Topics Concern  ? Not on file  ?Social History Narrative  ? Retired from [employer]  ? Marital Status: Married Feb 20, 1961, widowed 02-20-17.  Wife died from hodgkin's lymphoma.  She also had h/o colon cancer.    ? Remarried 05/2019.    ? Children: 2 daughters initially- one was disabled and lived with pt until she died in 02/20/13  ? [employer] '58-'61, deployed to [place].  ? ?Social Determinants of Health  ? ?Financial Resource Strain: Not on file  ?Food Insecurity: Not on file  ?Transportation Needs: Not on file  ?Physical Activity: Not on file  ?Stress: Not on file  ?Social Connections: Not on file  ? ?Family History  ?Problem Relation Age of Onset  ? Cancer Mother   ?     breast  ? Stroke Mother   ?     mets stroke  77  ? Fibromyalgia Mother   ? Arthritis Mother   ? Gallbladder disease Mother   ? Hypertension Mother   ? Heart disease Father   ? Cancer Father   ?     lung and prostate metastases  ? Stroke Father   ?     mini strokes  ? Prostate cancer Father   ? Gallbladder disease Father   ? Hypertension Father   ? Lupus Sister   ? Hepatitis C Sister   ? Diabetes Sister   ? GER disease Sister   ? Cancer Maternal Grandmother   ?     type unknown  ? Gallbladder disease Paternal Grandmother   ? Stroke Paternal Grandfather   ? Esophageal cancer Paternal Aunt   ? COPD  Daughter   ? Bronchitis Daughter   ? Liver disease Daughter   ? Colon cancer Neg Hx   ? Colon polyps Neg Hx   ? Stomach cancer Neg Hx   ? ?  ? ?  Review of Systems: Pertinent positive and negative review of systems were noted in the above HPI section. All other review of systems were otherwise negative. ? ? ?Physical Exam: ?General: Well developed, well nourished, no acute distress ?Head: Normocephalic and atraumatic ?Eyes: Sclerae anicteric, EOMI ?Ears: Normal auditory acuity ?Mouth: Not examined, mask on during Covid-19 pandemic ?Neck: Supple, no masses or thyromegaly ?Lungs: Clear throughout to auscultation ?Heart: Regular rate and rhythm; no murmurs, rubs or bruits ?Abdomen: Soft, non tender and non distended. No masses, hepatosplenomegaly or hernias noted. Normal Bowel sounds ?Rectal: Deferred to colonoscopy  ?Musculoskeletal: Symmetrical with no gross deformities  ?Skin: No lesions on visible extremities ?Pulses:  Normal pulses noted ?Extremities: No clubbing, cyanosis, edema or deformities noted ?Neurological: Alert oriented x 4, grossly nonfocal ?Cervical Nodes:  No significant cervical adenopathy ?Inguinal Nodes: No significant inguinal adenopathy ?Psychological:  Alert and cooperative. Normal mood and affect ? ? ?Assessment and Recommendations: ? ?Constipation.  Continue Metamucil daily, increase daily water intake.  Begin MiraLAX one half scoop to 2 scoops daily titrated for a complete bowel movement daily. ?CRC screening average risk.  He has a history of renal cancer status post nephrectomy and prostate cancer status post radiation treatments.  His first wife had colon cancer.  As his overall health is good would like to proceed with screening colonoscopy. The risks (including bleeding, perforation, infection, missed lesions, medication reactions and possible hospitalization or surgery if complications occur), benefits, and alternatives to colonoscopy with possible biopsy and possible polypectomy were  discussed with the patient and they consent to proceed.   ?GERD. Dysphagia.  Rule out esophageal stricture.  Follow antireflux measures and continue omeprazole 20 mg daily. ?Intermittent postprandial RUQ pain.  R/O c

## 2022-03-06 ENCOUNTER — Ambulatory Visit (HOSPITAL_COMMUNITY)
Admission: RE | Admit: 2022-03-06 | Discharge: 2022-03-06 | Disposition: A | Discharge status: Home / Self Care | Payer: PPO | Source: Ambulatory Visit | Attending: Gastroenterology | Admitting: Gastroenterology

## 2022-03-06 DIAGNOSIS — R109 Unspecified abdominal pain: Secondary | ICD-10-CM | POA: Diagnosis not present

## 2022-03-06 DIAGNOSIS — R1011 Right upper quadrant pain: Secondary | ICD-10-CM | POA: Diagnosis not present

## 2022-03-06 DIAGNOSIS — N281 Cyst of kidney, acquired: Secondary | ICD-10-CM | POA: Diagnosis not present

## 2022-03-06 DIAGNOSIS — K824 Cholesterolosis of gallbladder: Secondary | ICD-10-CM | POA: Diagnosis not present

## 2022-03-17 DIAGNOSIS — G4733 Obstructive sleep apnea (adult) (pediatric): Secondary | ICD-10-CM | POA: Diagnosis not present

## 2022-03-28 DIAGNOSIS — Z8546 Personal history of malignant neoplasm of prostate: Secondary | ICD-10-CM | POA: Diagnosis not present

## 2022-03-28 DIAGNOSIS — N281 Cyst of kidney, acquired: Secondary | ICD-10-CM | POA: Diagnosis not present

## 2022-04-07 ENCOUNTER — Ambulatory Visit (AMBULATORY_SURGERY_CENTER): Payer: PPO | Admitting: Gastroenterology

## 2022-04-07 ENCOUNTER — Encounter: Payer: Self-pay | Admitting: Gastroenterology

## 2022-04-07 VITALS — BP 99/60 | HR 62 | Temp 98.7°F | Resp 15 | Ht 63.0 in | Wt 143.0 lb

## 2022-04-07 DIAGNOSIS — K2100 Gastro-esophageal reflux disease with esophagitis, without bleeding: Secondary | ICD-10-CM

## 2022-04-07 DIAGNOSIS — B9681 Helicobacter pylori [H. pylori] as the cause of diseases classified elsewhere: Secondary | ICD-10-CM | POA: Diagnosis not present

## 2022-04-07 DIAGNOSIS — K2970 Gastritis, unspecified, without bleeding: Secondary | ICD-10-CM

## 2022-04-07 DIAGNOSIS — K31819 Angiodysplasia of stomach and duodenum without bleeding: Secondary | ICD-10-CM

## 2022-04-07 DIAGNOSIS — Z1212 Encounter for screening for malignant neoplasm of rectum: Secondary | ICD-10-CM

## 2022-04-07 DIAGNOSIS — K31A0 Gastric intestinal metaplasia, unspecified: Secondary | ICD-10-CM | POA: Diagnosis not present

## 2022-04-07 DIAGNOSIS — D122 Benign neoplasm of ascending colon: Secondary | ICD-10-CM | POA: Diagnosis not present

## 2022-04-07 DIAGNOSIS — N183 Chronic kidney disease, stage 3 unspecified: Secondary | ICD-10-CM | POA: Diagnosis not present

## 2022-04-07 DIAGNOSIS — I10 Essential (primary) hypertension: Secondary | ICD-10-CM | POA: Diagnosis not present

## 2022-04-07 DIAGNOSIS — G4733 Obstructive sleep apnea (adult) (pediatric): Secondary | ICD-10-CM | POA: Diagnosis not present

## 2022-04-07 DIAGNOSIS — Z1211 Encounter for screening for malignant neoplasm of colon: Secondary | ICD-10-CM | POA: Diagnosis not present

## 2022-04-07 DIAGNOSIS — R1310 Dysphagia, unspecified: Secondary | ICD-10-CM | POA: Diagnosis not present

## 2022-04-07 DIAGNOSIS — K2950 Unspecified chronic gastritis without bleeding: Secondary | ICD-10-CM | POA: Diagnosis not present

## 2022-04-07 MED ORDER — SODIUM CHLORIDE 0.9 % IV SOLN: 500.0000 mL | Freq: Once | INTRAVENOUS | Status: DC

## 2022-04-07 NOTE — Op Note (Signed)
[facility] ?Patient Name: Kenneth Ross ?Procedure Date: 04/07/2022 2:41 PM ?MRN: [id number] ?Endoscopist: Ladene Artist , MD ?Age: 86 ?Referring MD:  ?Date of Birth: Dec 25, 1935 ?Gender: Male ?Account #: [id number] ?Procedure:                Colonoscopy ?Indications:              Screening for colorectal malignant neoplasm ?Medicines:                Monitored Anesthesia Care ?Procedure:                Pre-Anesthesia Assessment: ?                          - Prior to the procedure, a History and Physical  ?                          was performed, and patient medications and  ?                          allergies were reviewed. The patient's tolerance of  ?                          previous anesthesia was also reviewed. The risks  ?                          and benefits of the procedure and the sedation  ?                          options and risks were discussed with the patient.  ?                          All questions were answered, and informed consent  ?                          was obtained. Prior Anticoagulants: The patient has  ?                          taken no previous anticoagulant or antiplatelet  ?                          agents. ASA Grade Assessment: II - A patient with  ?                          mild systemic disease. After reviewing the risks  ?                          and benefits, the patient was deemed in  ?                          satisfactory condition to undergo the procedure. ?                          After obtaining informed consent, the colonoscope  ?  was passed under direct vision. Throughout the  ?                          procedure, the patient's blood pressure, pulse, and  ?                          oxygen saturations were monitored continuously. The  ?                          Olympus PCF-H190DL (#[id number]) Colonoscope was  ?                          introduced through the anus and advanced to the the  ?                          cecum,  identified by appendiceal orifice and  ?                          ileocecal valve. The ileocecal valve, appendiceal  ?                          orifice, and rectum were photographed. The quality  ?                          of the bowel preparation was adequate after  ?                          extensive lavage, suction. The colonoscopy was  ?                          performed without difficulty. The patient tolerated  ?                          the procedure well. ?Scope In: 2:49:10 PM ?Scope Out: 3:10:24 PM ?Scope Withdrawal Time: 0 hours 12 minutes 34 seconds  ?Total Procedure Duration: 0 hours 21 minutes 14 seconds  ?Findings:                 The perianal and digital rectal examinations were  ?                          normal. ?                          A 6 mm polyp was found in the ascending colon. The  ?                          polyp was sessile. The polyp was removed with a  ?                          cold snare. Resection and retrieval were complete. ?                          Multiple small-mouthed diverticula were found in  ?  the left colon. There was narrowing of the colon in  ?                          association with the diverticular opening. There  ?                          was evidence of diverticular spasm. There was  ?                          evidence of an impacted diverticulum. There was no  ?                          evidence of diverticular bleeding. ?                          Internal hemorrhoids were found during  ?                          retroflexion. The hemorrhoids were small and Grade  ?                          I (internal hemorrhoids that do not prolapse). ?                          The exam was otherwise without abnormality on  ?                          direct and retroflexion views. ?Complications:            No immediate complications. Estimated blood loss:  ?                          None. ?Estimated Blood Loss:     Estimated blood loss:  none. ?Impression:               - One 6 mm polyp in the ascending colon, removed  ?                          with a cold snare. Resected and retrieved. ?                          - Moderate diverticulosis in the left colon. ?                          - Internal hemorrhoids. ?                          - The examination was otherwise normal on direct  ?                          and retroflexion views. ?Recommendation:           - Patient has a contact number available for  ?                          emergencies. The signs and symptoms  of potential  ?                          delayed complications were discussed with the  ?                          patient. Return to normal activities tomorrow.  ?                          Written discharge instructions were provided to the  ?                          patient. ?                          - High fiber diet. ?                          - Continue present medications. ?                          - Await pathology results. ?                          - No repeat colonoscopy due to age. ?Ladene Artist, MD ?04/07/2022 3:22:56 PM ?This report has been signed electronically. ?

## 2022-04-07 NOTE — Op Note (Signed)
[facility] ?Patient Name: Kenneth Ross ?Procedure Date: 04/07/2022 2:40 PM ?MRN: [id number] ?Endoscopist: Ladene Artist , MD ?Age: 86 ?Referring MD:  ?Date of Birth: 08/20/1936 ?Gender: Male ?Account #: [id number] ?Procedure:                Upper GI endoscopy ?Indications:              Dysphagia, Gastroesophageal reflux disease ?Medicines:                Monitored Anesthesia Care ?Procedure:                Pre-Anesthesia Assessment: ?                          - Prior to the procedure, a History and Physical  ?                          was performed, and patient medications and  ?                          allergies were reviewed. The patient's tolerance of  ?                          previous anesthesia was also reviewed. The risks  ?                          and benefits of the procedure and the sedation  ?                          options and risks were discussed with the patient.  ?                          All questions were answered, and informed consent  ?                          was obtained. Prior Anticoagulants: The patient has  ?                          taken no previous anticoagulant or antiplatelet  ?                          agents. ASA Grade Assessment: II - A patient with  ?                          mild systemic disease. After reviewing the risks  ?                          and benefits, the patient was deemed in  ?                          satisfactory condition to undergo the procedure. ?                          After obtaining informed consent, the endoscope was  ?  passed under direct vision. Throughout the  ?                          procedure, the patient's blood pressure, pulse, and  ?                          oxygen saturations were monitored continuously. The  ?                          Endoscope was introduced through the mouth, and  ?                          advanced to the second part of duodenum. The upper  ?                          GI  endoscopy was accomplished without difficulty.  ?                          The patient tolerated the procedure well. ?Scope In: ?Scope Out: ?Findings:                 LA Grade B (one or more mucosal breaks greater than  ?                          5 mm, not extending between the tops of two mucosal  ?                          folds) esophagitis with no bleeding was found in  ?                          the distal esophagus. ?                          No endoscopic abnormality was evident in the  ?                          esophagus to explain the patient's complaint of  ?                          dysphagia. It was decided, however, to proceed with  ?                          dilation of the entire esophagus. A guidewire was  ?                          placed and the scope was withdrawn. Dilation was  ?                          performed with a Savary dilator with no resistance  ?                          at 16 mm. ?  Diffuse moderate inflammation characterized by  ?                          congestion (edema), erythema and friability was  ?                          found in the entire examined stomach. Biopsies were  ?                          taken with a cold forceps for histology. ?                          The exam of the stomach was otherwise normal. ?                          A few localized erosions without bleeding were  ?                          found in the duodenal bulb. ?                          A single 2 mm angiodysplastic lesion without  ?                          bleeding was found in the second portion of the  ?                          duodenum. ?                          The exam of the duodenum was otherwise normal. ?Complications:            No immediate complications. ?Estimated Blood Loss:     Estimated blood loss was minimal. ?Impression:               - LA Grade B reflux esophagitis with no bleeding. ?                          - No endoscopic esophageal abnormality to  explain  ?                          patient's dysphagia. Esophagus dilated. ?                          - Gastritis. Biopsied. ?                          - Duodenal erosions without bleeding. ?                          - Duodenal AVM. ?Recommendation:           - Patient has a contact number available for  ?                          emergencies. The signs and symptoms of potential  ?  delayed complications were discussed with the  ?                          patient. Return to normal activities tomorrow.  ?                          Written discharge instructions were provided to the  ?                          patient. ?                          - Clear liquid diet for 2 hours, then advance as  ?                          tolerated to soft diet today. ?                          - Resume prior diet tomorrow. ?                          - Follow antireflux measures ?                          - Continue present medications including omeprazole  ?                          20 mg po qd long term. ?                          - Await pathology results. ?Ladene Artist, MD ?04/07/2022 3:28:17 PM ?This report has been signed electronically. ?

## 2022-04-07 NOTE — Patient Instructions (Addendum)
Handout on polyps, gastritis, hemorrhoids, diverticulosis provided  ? ?Await pathology results.  ? ?Continue current medications.  ? ? Clear liquids for 2 hours (until 5:15 pm), then advance to a soft diet for today.  ? ?YOU HAD AN ENDOSCOPIC PROCEDURE TODAY AT [facility]:   Refer to the procedure report that was given to you for any specific questions about what was found during the examination.  If the procedure report does not answer your questions, please call your gastroenterologist to clarify.  If you requested that your care partner not be given the details of your procedure findings, then the procedure report has been included in a sealed envelope for you to review at your convenience later. ? ?YOU SHOULD EXPECT: Some feelings of bloating in the abdomen. Passage of more gas than usual.  Walking can help get rid of the air that was put into your GI tract during the procedure and reduce the bloating. If you had a lower endoscopy (such as a colonoscopy or flexible sigmoidoscopy) you may notice spotting of blood in your stool or on the toilet paper. If you underwent a bowel prep for your procedure, you may not have a normal bowel movement for a few days. ? ?Please Note:  You might notice some irritation and congestion in your nose or some drainage.  This is from the oxygen used during your procedure.  There is no need for concern and it should clear up in a day or so. ? ?SYMPTOMS TO REPORT IMMEDIATELY: ? ?Following lower endoscopy (colonoscopy or flexible sigmoidoscopy): ? Excessive amounts of blood in the stool ? Significant tenderness or worsening of abdominal pains ? Swelling of the abdomen that is new, acute ? Fever of 100?F or higher ? ?Following upper endoscopy (EGD) ? Vomiting of blood or coffee ground material ? New chest pain or pain under the shoulder blades ? Painful or persistently difficult swallowing ? New shortness of breath ? Fever of 100?F or higher ? Black, tarry-looking  stools ? ?For urgent or emergent issues, a gastroenterologist can be reached at any hour by calling [phone]. ?Do not use MyChart messaging for urgent concerns.  ? ? ?DIET:  follow the dilation diet. Handout provided Drink plenty of fluids but you should avoid alcoholic beverages for 24 hours. ? ?ACTIVITY:  You should plan to take it easy for the rest of today and you should NOT DRIVE or use heavy machinery until tomorrow (because of the sedation medicines used during the test).   ? ?FOLLOW UP: ?Our staff will call the number listed on your records 48-72 hours following your procedure to check on you and address any questions or concerns that you may have regarding the information given to you following your procedure. If we do not reach you, we will leave a message.  We will attempt to reach you two times.  During this call, we will ask if you have developed any symptoms of COVID 19. If you develop any symptoms (ie: fever, flu-like symptoms, shortness of breath, cough etc.) before then, please call [phone].  If you test positive for Covid 19 in the 2 weeks post procedure, please call and report this information to [place].   ? ?If any biopsies were taken you will be contacted by phone or by letter within the next 1-3 weeks.  Please call us at [phone] if you have not heard about the biopsies in 3 weeks.  ? ? ?SIGNATURES/CONFIDENTIALITY: ?You and/or your care partner have  signed paperwork which will be entered into your electronic medical record.  These signatures attest to the fact that that the information above on your After Visit Summary has been reviewed and is understood.  Full responsibility of the confidentiality of this discharge information lies with you and/or your care-partner. ? ? ?

## 2022-04-07 NOTE — Progress Notes (Signed)
Report to PACU, RN, vss, BBS= Clear.  

## 2022-04-07 NOTE — Progress Notes (Signed)
Called to room to assist during endoscopic procedure.  Patient ID and intended procedure confirmed with present staff. Received instructions for my participation in the procedure from the performing physician.  

## 2022-04-07 NOTE — Progress Notes (Signed)
? ?History & Physical ? ?Primary Care Physician:  Tonia Ghent, MD ?Primary Gastroenterologist: Lucio Edward, MD ? ?CHIEF COMPLAINT:  CRC screening, dysphagia ? ?HPI: Kenneth Ross is a 86 y.o. male with dysphagia and CRC screening, average risk, for EGD and colonoscopy. ? ? ?Past Medical History:  ?Diagnosis Date  ? Allergic rhinitis, seasonal   ? Allergy   ? Bell's palsy   ? with L eyelid droop at baseline as of 2017  ? Blood transfusion without reported diagnosis   ? Cancer of kidney ([place])   ? Cataract   ? CKD (chronic kidney disease), stage III ([place])   ? followed by pcp  ? ED (erectile dysfunction)   ? GERD (gastroesophageal reflux disease)   ? occasional take tums  ? History of renal cell carcinoma   ? 1986  s/p  left nephrectomy  ? Hyperlipidemia   ? Idiopathic scoliosis   ? Pneumonia   ? Prostate cancer [facility]) urologist-- dr dahlstedt/  oncologist-- dr [name]  ? dx 09-07-2019--- Stage T1c,  Gleason 4+3  ? Seasonal allergies   ? Sleep apnea   ? Cpap  ? Solitary right kidney   ? s/p  left nephrecotmy 1986  ? Tachycardia followed by pcp  ? HR 100 @ pcp office --- pt referred to cardiology for consult w/ dr t. Rockey Situ note in epic 12-14-2017, by pcp (dr Damita Dunnings) no work up done,  pt has lopressor as needed if heart rate is elevated as pt feels appropiate  ? ? ?Past Surgical History:  ?Procedure Laterality Date  ? CATARACT EXTRACTION Left 06/10/2018  ? Dr. Valetta Close  ? CATARACT EXTRACTION Right 2021  ? CATARACT EXTRACTION W/ INTRAOCULAR LENS IMPLANT Left   ? LAMINECTOMY  1993  ? L3-5  ? NEPHRECTOMY Left 10/1985  ? RADIOACTIVE SEED IMPLANT N/A 10/21/2019  ? Procedure: RADIOACTIVE SEED IMPLANT/BRACHYTHERAPY IMPLANT;  Surgeon: Franchot Gallo, MD;  Location: [facility];  Service: Urology;  Laterality: N/A;  65 seeds implanted  ? SHOULDER OPEN ROTATOR CUFF REPAIR  12/1998  ? Right  ? SHOULDER OPEN ROTATOR CUFF REPAIR Bilateral 08/2001  ? SPACE OAR INSTILLATION N/A 10/21/2019  ? Procedure: SPACE  OAR INSTILLATION;  Surgeon: Franchot Gallo, MD;  Location: [facility];  Service: Urology;  Laterality: N/A;  ? ? ?Prior to Admission medications   ?Medication Sig Start Date End Date Taking? Authorizing Provider  ?aspirin 81 MG tablet Take 81 mg by mouth daily.     Yes [provider]  ?Calcium 500-125 MG-UNIT TABS Take 1 tablet by mouth daily.     Yes [provider]  ?calcium carbonate (TUMS - DOSED IN MG ELEMENTAL CALCIUM) 500 MG chewable tablet Chew 1 tablet by mouth daily as needed for indigestion or heartburn.   Yes [provider]  ?Cholecalciferol (VITAMIN D) 1000 UNITS capsule Take 1,000 Units by mouth daily.     Yes [provider]  ?loratadine (CLARITIN) 10 MG tablet Take 10 mg by mouth daily.    Yes [provider]  ?Multiple Vitamin (MULTIVITAMIN) capsule Take 1 capsule by mouth daily.     Yes [provider]  ?simvastatin (ZOCOR) 40 MG tablet Take 1 tablet (40 mg total) by mouth every evening. 09/09/21  Yes Tonia Ghent, MD  ?Azelastine HCl 0.15 % SOLN Place 1 spray into the nose 2 (two) times daily as needed (Allergies). ?Patient not taking: Reported on 04/07/2022 07/17/21   Chesley Mires, MD  ?fish oil-omega-3 fatty  acids 1000 MG capsule Take 1 g by mouth daily.  ?Patient not taking: Reported on 04/07/2022    [provider]  ?fluticasone (FLONASE) 50 MCG/ACT nasal spray Place 2 sprays into both nostrils daily. ?Patient not taking: Reported on 04/07/2022 09/09/18   Tonia Ghent, MD  ?metoprolol tartrate (LOPRESSOR) 25 MG tablet Take 0.5-1 tablets (12.5-25 mg total) by mouth 2 (two) times daily as needed. ?Patient not taking: Reported on 04/07/2022 10/26/17   Tonia Ghent, MD  ?omeprazole (PRILOSEC) 20 MG capsule Take 1 capsule (20 mg total) by mouth daily. ?Patient not taking: Reported on 04/07/2022 09/09/21   Tonia Ghent, MD  ?sildenafil (REVATIO) 20 MG tablet TAKE 3 TO 5 TABLETS BY MOUTH ONCE DAILY AS NEEDED  09/09/21   Tonia Ghent, MD  ? ? ?Current Outpatient Medications  ?Medication Sig Dispense Refill  ? aspirin 81 MG tablet Take 81 mg by mouth daily.      ? Calcium 500-125 MG-UNIT TABS Take 1 tablet by mouth daily.      ? calcium carbonate (TUMS - DOSED IN MG ELEMENTAL CALCIUM) 500 MG chewable tablet Chew 1 tablet by mouth daily as needed for indigestion or heartburn.    ? Cholecalciferol (VITAMIN D) 1000 UNITS capsule Take 1,000 Units by mouth daily.      ? loratadine (CLARITIN) 10 MG tablet Take 10 mg by mouth daily.     ? Multiple Vitamin (MULTIVITAMIN) capsule Take 1 capsule by mouth daily.      ? simvastatin (ZOCOR) 40 MG tablet Take 1 tablet (40 mg total) by mouth every evening. 90 tablet 3  ? Azelastine HCl 0.15 % SOLN Place 1 spray into the nose 2 (two) times daily as needed (Allergies). (Patient not taking: Reported on 04/07/2022)    ? fish oil-omega-3 fatty acids 1000 MG capsule Take 1 g by mouth daily.  (Patient not taking: Reported on 04/07/2022)    ? fluticasone (FLONASE) 50 MCG/ACT nasal spray Place 2 sprays into both nostrils daily. (Patient not taking: Reported on 04/07/2022)    ? metoprolol tartrate (LOPRESSOR) 25 MG tablet Take 0.5-1 tablets (12.5-25 mg total) by mouth 2 (two) times daily as needed. (Patient not taking: Reported on 04/07/2022) 180 tablet 3  ? omeprazole (PRILOSEC) 20 MG capsule Take 1 capsule (20 mg total) by mouth daily. (Patient not taking: Reported on 04/07/2022)    ? sildenafil (REVATIO) 20 MG tablet TAKE 3 TO 5 TABLETS BY MOUTH ONCE DAILY AS NEEDED 50 tablet prn  ? ?Current Facility-Administered Medications  ?Medication Dose Route Frequency Provider Last Rate Last Admin  ? 0.9 %  sodium chloride infusion  500 mL Intravenous Once Ladene Artist, MD      ? ? ?Allergies as of 04/07/2022 - Review Complete 04/07/2022  ?Allergen Reaction Noted  ? Ibuprofen  09/06/2020  ? Iodinated contrast media Nausea And Vomiting 07/24/2011  ? ? ?Family History  ?Problem Relation Age of Onset  ?  Cancer Mother   ?     breast  ? Stroke Mother   ?     mets stroke  71  ? Fibromyalgia Mother   ? Arthritis Mother   ? Gallbladder disease Mother   ? Hypertension Mother   ? Heart disease Father   ? Cancer Father   ?     lung and prostate metastases  ? Stroke Father   ?     mini strokes  ? Prostate cancer Father   ? Gallbladder disease Father   ?  Hypertension Father   ? Liver cancer Sister   ? Lupus Sister   ? Hepatitis C Sister   ? Diabetes Sister   ? GER disease Sister   ? Esophageal cancer Paternal Aunt   ? Cancer Maternal Grandmother   ?     type unknown  ? Gallbladder disease Paternal Grandmother   ? Stroke Paternal Grandfather   ? COPD Daughter   ? Bronchitis Daughter   ? Liver disease Daughter   ? Colon cancer Cousin   ? Rectal cancer Cousin   ? Bladder Cancer Cousin   ? Colon polyps Neg Hx   ? Stomach cancer Neg Hx   ? ? ?Social History  ? ?Socioeconomic History  ? Marital status: Married  ?  Spouse name: Not on file  ? Number of children: 2  ? Years of education: Not on file  ? Highest education level: Not on file  ?Occupational History  ? Occupation: Retired  ?Tobacco Use  ? Smoking status: Never  ? Smokeless tobacco: Never  ?Vaping Use  ? Vaping Use: Never used  ?Substance and Sexual Activity  ? Alcohol use: No  ?  Alcohol/week: 0.0 standard drinks  ? Drug use: Never  ? Sexual activity: Yes  ?Other Topics Concern  ? Not on file  ?Social History Narrative  ? Retired from [employer]  ? Marital Status: Married 02-13-61, widowed 02-13-2017.  Wife died from hodgkin's lymphoma.  She also had h/o colon cancer.    ? Remarried 05/2019.    ? Children: 2 daughters initially- one was disabled and lived with pt until she died in 2013-02-13  ? [employer] '58-'61, deployed to [place].  ? ?Social Determinants of Health  ? ?Financial Resource Strain: Not on file  ?Food Insecurity: Not on file  ?Transportation Needs: Not on file  ?Physical Activity: Not on file  ?Stress: Not on file  ?Social Connections: Not on file   ?Intimate Partner Violence: Not on file  ? ? ?Review of Systems: ? ?All systems reviewed an negative except where noted in HPI. ? ?Gen: Denies any fever, chills, sweats, anorexia, fatigue, weakness, malaise, weigh

## 2022-04-09 ENCOUNTER — Telehealth: Payer: Self-pay | Admitting: *Deleted

## 2022-04-09 NOTE — Telephone Encounter (Signed)
?  Follow up Call- ? ? ?  04/07/2022  ?  2:17 PM  ?Call back number  ?Post procedure Call Back phone  # [phone]  ?Permission to leave phone message Yes  ?  ? ?Patient questions: ? ?Do you have a fever, pain , or abdominal swelling? No. ?Pain Score  0 * ? ?Have you tolerated food without any problems? Yes.   ? ?Have you been able to return to your normal activities? Yes.   ? ?Do you have any questions about your discharge instructions: ?Diet   No. ?Medications  No. ?Follow up visit  No. ? ?Do you have questions or concerns about your Care? No. ? ?Actions: ?* If pain score is 4 or above: ?No action needed, pain <4. ? ? ?

## 2022-04-16 ENCOUNTER — Other Ambulatory Visit: Payer: Self-pay

## 2022-04-16 DIAGNOSIS — B9681 Helicobacter pylori [H. pylori] as the cause of diseases classified elsewhere: Secondary | ICD-10-CM

## 2022-04-16 MED ORDER — TALICIA 250-12.5-10 MG PO CPDR: 4.0000 | DELAYED_RELEASE_CAPSULE | Freq: Three times a day (TID) | ORAL | 0 refills | Status: AC

## 2022-04-29 ENCOUNTER — Telehealth: Payer: Self-pay | Admitting: Gastroenterology

## 2022-04-29 ENCOUNTER — Telehealth: Payer: Self-pay | Admitting: Pharmacy Technician

## 2022-04-29 NOTE — Telephone Encounter (Signed)
Patient Advocate Encounter  Received notification from [place] that prior authorization for TALICIA is required.   PA submitted on 5.30.23 Key [id number] Status is pending   [facility] will continue to follow  Luciano Cutter, CPhT Patient Advocate Phone: [phone]

## 2022-04-29 NOTE — Telephone Encounter (Signed)
Please see alternate phone note started by Burney Gauze, CPhT. Informed patient we are still waiting to hear back from his insurance company to find [employer] is approved and at what cost. Informed patient we will contact him once we hear from insurance. Patient verbalized understanding.

## 2022-04-30 NOTE — Telephone Encounter (Signed)
Received notification from [facility] regarding a prior authorization for [place]. Authorization has been APPROVED from 5.30.21 to 12.31.23.   Per test claim, copay for 14 days supply is $46.67   Authorization # PA Case ID: [id number]

## 2022-05-02 DIAGNOSIS — G4733 Obstructive sleep apnea (adult) (pediatric): Secondary | ICD-10-CM | POA: Diagnosis not present

## 2022-05-13 ENCOUNTER — Ambulatory Visit (INDEPENDENT_AMBULATORY_CARE_PROVIDER_SITE_OTHER): Payer: PPO | Admitting: Family Medicine

## 2022-05-13 ENCOUNTER — Encounter: Payer: Self-pay | Admitting: Family Medicine

## 2022-05-13 VITALS — BP 122/62 | HR 95 | Temp 98.0°F | Ht 63.0 in | Wt 143.0 lb

## 2022-05-13 DIAGNOSIS — A048 Other specified bacterial intestinal infections: Secondary | ICD-10-CM | POA: Diagnosis not present

## 2022-05-13 NOTE — Patient Instructions (Addendum)
H pylori stool test at least 4 weeks after completing antibiotics and at least 2 weeks off all omeprazole or similar.   Take care.  Glad to see you.  I'll update GI and Dr. Lorelei Pont.

## 2022-05-13 NOTE — Progress Notes (Signed)
No need for or use of metoprolol so far.    PSA is still decreasing with prev eval per urology.   He is dealing with CTS.  Prev injection helped for about 2.5 months.  I need to check with Dr. Lorelei Pont about repeat injection timeline.     No abd pain now.  Abd not ttp.    Ask GI about getting the f/u H pylori stool test done here.

## 2022-05-14 ENCOUNTER — Telehealth: Payer: Self-pay | Admitting: Family Medicine

## 2022-05-14 NOTE — Telephone Encounter (Signed)
I saw patient recently in clinic.  He is tolerating H. pylori treatment.  It is much closer for the patient to come to my clinic so he was going to get his follow-up H. pylori stool antigen test done here at my clinic.  If that is not okay then please let me know.  I put in the follow-up orders.  Thank you.

## 2022-05-14 NOTE — Telephone Encounter (Signed)
When can this patient get a repeat carpal tunnel injection?  He benefited from previous injection for about 2.5 months.  Thanks.

## 2022-05-15 DIAGNOSIS — G4733 Obstructive sleep apnea (adult) (pediatric): Secondary | ICD-10-CM | POA: Diagnosis not present

## 2022-05-15 NOTE — Telephone Encounter (Signed)
lmtcb

## 2022-05-15 NOTE — Telephone Encounter (Addendum)
Please get patient scheduled when possible with Dr. Lorelei Pont.  He had injection done 01/29/22.  See other phone note.  Thanks.

## 2022-05-15 NOTE — Telephone Encounter (Signed)
Spoke with patient about injection and scheduled appt with Dr. Lorelei Pont 05/19/22 at 11:40 am.

## 2022-05-15 NOTE — Telephone Encounter (Signed)
Please let patient know that GI was fine with getting the f/u testing done here as planned.  See other phone note.  Thanks.

## 2022-05-15 NOTE — Telephone Encounter (Signed)
Every three months, but I would rather not repeat them over and over again.

## 2022-05-15 NOTE — Telephone Encounter (Signed)
Discussed message below with patient and he verbalized understanding.

## 2022-05-15 NOTE — Telephone Encounter (Signed)
Patient returned phone call. °

## 2022-05-16 ENCOUNTER — Encounter: Payer: Self-pay | Admitting: Emergency Medicine

## 2022-05-16 ENCOUNTER — Emergency Department: Payer: PPO

## 2022-05-16 ENCOUNTER — Observation Stay
Admission: EM | Admit: 2022-05-16 | Discharge: 2022-05-17 | Disposition: A | Discharge status: Home / Self Care | Payer: PPO | Attending: Internal Medicine | Admitting: Internal Medicine

## 2022-05-16 ENCOUNTER — Other Ambulatory Visit: Payer: Self-pay

## 2022-05-16 DIAGNOSIS — N1831 Chronic kidney disease, stage 3a: Secondary | ICD-10-CM | POA: Insufficient documentation

## 2022-05-16 DIAGNOSIS — Z85528 Personal history of other malignant neoplasm of kidney: Secondary | ICD-10-CM | POA: Insufficient documentation

## 2022-05-16 DIAGNOSIS — R6510 Systemic inflammatory response syndrome (SIRS) of non-infectious origin without acute organ dysfunction: Secondary | ICD-10-CM | POA: Diagnosis not present

## 2022-05-16 DIAGNOSIS — T50905A Adverse effect of unspecified drugs, medicaments and biological substances, initial encounter: Secondary | ICD-10-CM

## 2022-05-16 DIAGNOSIS — E7800 Pure hypercholesterolemia, unspecified: Secondary | ICD-10-CM | POA: Diagnosis not present

## 2022-05-16 DIAGNOSIS — A048 Other specified bacterial intestinal infections: Secondary | ICD-10-CM | POA: Diagnosis present

## 2022-05-16 DIAGNOSIS — Z20822 Contact with and (suspected) exposure to covid-19: Secondary | ICD-10-CM | POA: Insufficient documentation

## 2022-05-16 DIAGNOSIS — E782 Mixed hyperlipidemia: Secondary | ICD-10-CM | POA: Insufficient documentation

## 2022-05-16 DIAGNOSIS — N179 Acute kidney failure, unspecified: Principal | ICD-10-CM | POA: Insufficient documentation

## 2022-05-16 DIAGNOSIS — Z79899 Other long term (current) drug therapy: Secondary | ICD-10-CM | POA: Diagnosis not present

## 2022-05-16 DIAGNOSIS — R918 Other nonspecific abnormal finding of lung field: Secondary | ICD-10-CM | POA: Diagnosis not present

## 2022-05-16 DIAGNOSIS — D7212 Drug rash with eosinophilia and systemic symptoms syndrome: Secondary | ICD-10-CM | POA: Diagnosis not present

## 2022-05-16 DIAGNOSIS — E785 Hyperlipidemia, unspecified: Secondary | ICD-10-CM

## 2022-05-16 DIAGNOSIS — R6511 Systemic inflammatory response syndrome (SIRS) of non-infectious origin with acute organ dysfunction: Secondary | ICD-10-CM | POA: Insufficient documentation

## 2022-05-16 DIAGNOSIS — R000 Tachycardia, unspecified: Secondary | ICD-10-CM | POA: Insufficient documentation

## 2022-05-16 DIAGNOSIS — Z8546 Personal history of malignant neoplasm of prostate: Secondary | ICD-10-CM | POA: Diagnosis not present

## 2022-05-16 DIAGNOSIS — G4730 Sleep apnea, unspecified: Secondary | ICD-10-CM | POA: Diagnosis present

## 2022-05-16 DIAGNOSIS — R21 Rash and other nonspecific skin eruption: Secondary | ICD-10-CM | POA: Diagnosis not present

## 2022-05-16 LAB — CBC WITH DIFFERENTIAL/PLATELET
Abs Immature Granulocytes: 0.13 10*3/uL — ABNORMAL HIGH (ref 0.00–0.07)
Basophils Absolute: 0 10*3/uL (ref 0.0–0.1)
Basophils Relative: 1 %
Eosinophils Absolute: 0.1 10*3/uL (ref 0.0–0.5)
Eosinophils Relative: 2 %
HCT: 37.3 % — ABNORMAL LOW (ref 39.0–52.0)
Hemoglobin: 12.1 g/dL — ABNORMAL LOW (ref 13.0–17.0)
Immature Granulocytes: 2 %
Lymphocytes Relative: 7 %
Lymphs Abs: 0.4 10*3/uL — ABNORMAL LOW (ref 0.7–4.0)
MCH: 31.1 pg (ref 26.0–34.0)
MCHC: 32.4 g/dL (ref 30.0–36.0)
MCV: 95.9 fL (ref 80.0–100.0)
Monocytes Absolute: 0.6 10*3/uL (ref 0.1–1.0)
Monocytes Relative: 10 %
Neutro Abs: 4.8 10*3/uL (ref 1.7–7.7)
Neutrophils Relative %: 78 %
Platelets: 178 10*3/uL (ref 150–400)
RBC: 3.89 MIL/uL — ABNORMAL LOW (ref 4.22–5.81)
RDW: 13.7 % (ref 11.5–15.5)
WBC: 6.1 10*3/uL (ref 4.0–10.5)
nRBC: 0 % (ref 0.0–0.2)

## 2022-05-16 LAB — COMPREHENSIVE METABOLIC PANEL
ALT: 39 U/L (ref 0–44)
AST: 32 U/L (ref 15–41)
Albumin: 3.8 g/dL (ref 3.5–5.0)
Alkaline Phosphatase: 58 U/L (ref 38–126)
Anion gap: 9 (ref 5–15)
BUN: 33 mg/dL — ABNORMAL HIGH (ref 8–23)
CO2: 21 mmol/L — ABNORMAL LOW (ref 22–32)
Calcium: 8.5 mg/dL — ABNORMAL LOW (ref 8.9–10.3)
Chloride: 106 mmol/L (ref 98–111)
Creatinine, Ser: 2.16 mg/dL — ABNORMAL HIGH (ref 0.61–1.24)
GFR, Estimated: 30 mL/min — ABNORMAL LOW (ref >60)
Glucose, Bld: 146 mg/dL — ABNORMAL HIGH (ref 70–99)
Potassium: 4.3 mmol/L (ref 3.5–5.1)
Sodium: 136 mmol/L (ref 135–145)
Total Bilirubin: 0.6 mg/dL (ref 0.3–1.2)
Total Protein: 6.6 g/dL (ref 6.5–8.1)

## 2022-05-16 LAB — URINALYSIS, COMPLETE (UACMP) WITH MICROSCOPIC
Bacteria, UA: NONE SEEN
Bilirubin Urine: NEGATIVE
Glucose, UA: NEGATIVE mg/dL
Hgb urine dipstick: NEGATIVE
Ketones, ur: NEGATIVE mg/dL
Leukocytes,Ua: NEGATIVE
Nitrite: NEGATIVE
Protein, ur: NEGATIVE mg/dL
Specific Gravity, Urine: 1.019 (ref 1.005–1.030)
Squamous Epithelial / HPF: NONE SEEN (ref 0–5)
pH: 5 (ref 5.0–8.0)

## 2022-05-16 LAB — LACTIC ACID, PLASMA
Lactic Acid, Venous: 2.6 mmol/L (ref 0.5–1.9)
Lactic Acid, Venous: 0.9 mmol/L (ref 0.5–1.9)
Lactic Acid, Venous: 1.2 mmol/L (ref 0.5–1.9)
Lactic Acid, Venous: 1.3 mmol/L (ref 0.5–1.9)

## 2022-05-16 LAB — PROTIME-INR
INR: 1.1 (ref 0.8–1.2)
Prothrombin Time: 14.4 seconds (ref 11.4–15.2)

## 2022-05-16 LAB — RESP PANEL BY RT-PCR (FLU A&B, COVID) ARPGX2
Influenza A by PCR: NEGATIVE
Influenza B by PCR: NEGATIVE
SARS Coronavirus 2 by RT PCR: NEGATIVE

## 2022-05-16 LAB — LIPASE, BLOOD: Lipase: 36 U/L (ref 11–51)

## 2022-05-16 LAB — APTT: aPTT: 40 seconds — ABNORMAL HIGH (ref 24–36)

## 2022-05-16 LAB — PROCALCITONIN: Procalcitonin: 0.29 ng/mL

## 2022-05-16 LAB — TROPONIN I (HIGH SENSITIVITY): Troponin I (High Sensitivity): 8 ng/L (ref <18)

## 2022-05-16 MED ORDER — ACETAMINOPHEN 325 MG PO TABS: 650.0000 mg | ORAL_TABLET | Freq: Four times a day (QID) | ORAL | Status: DC | PRN

## 2022-05-16 MED ORDER — METHYLPREDNISOLONE SODIUM SUCC 40 MG IJ SOLR
40.0000 mg | Freq: Every day | INTRAMUSCULAR | Status: DC
  Administered 2022-05-16 – 2022-05-17 (×2): 40 mg via INTRAVENOUS
  Filled 2022-05-16 (×2): qty 1

## 2022-05-16 MED ORDER — MULTIVITAMINS PO CAPS: 1.0000 | ORAL_CAPSULE | Freq: Every day | ORAL | Status: DC

## 2022-05-16 MED ORDER — SODIUM CHLORIDE 0.9 % IV SOLN: 2.0000 g | Freq: Once | INTRAVENOUS | Status: DC

## 2022-05-16 MED ORDER — SODIUM CHLORIDE 0.9 % IV SOLN
2.0000 g | INTRAVENOUS | Status: AC
Start: 2022-05-16 — End: 2022-05-16
  Administered 2022-05-16: 2 g via INTRAVENOUS
  Filled 2022-05-16: qty 12.5

## 2022-05-16 MED ORDER — ADULT MULTIVITAMIN W/MINERALS CH
1.0000 | ORAL_TABLET | Freq: Every day | ORAL | Status: DC
  Administered 2022-05-16 – 2022-05-17 (×2): 1 via ORAL
  Filled 2022-05-16 (×2): qty 1

## 2022-05-16 MED ORDER — VANCOMYCIN HCL IN DEXTROSE 1-5 GM/200ML-% IV SOLN
1000.0000 mg | INTRAVENOUS | Status: AC
  Administered 2022-05-16: 1000 mg via INTRAVENOUS
  Filled 2022-05-16: qty 200

## 2022-05-16 MED ORDER — SODIUM CHLORIDE 0.9 % IV BOLUS
500.0000 mL | Freq: Once | INTRAVENOUS | Status: AC
  Administered 2022-05-16: 500 mL via INTRAVENOUS

## 2022-05-16 MED ORDER — DOXYCYCLINE HYCLATE 100 MG PO TABS
100.0000 mg | ORAL_TABLET | Freq: Two times a day (BID) | ORAL | Status: DC
Start: 2022-05-16 — End: 2022-05-17
  Administered 2022-05-16 – 2022-05-17 (×2): 100 mg via ORAL
  Filled 2022-05-16 (×2): qty 1

## 2022-05-16 MED ORDER — SODIUM CHLORIDE 0.9 % IV SOLN: INTRAVENOUS | Status: DC

## 2022-05-16 MED ORDER — PANTOPRAZOLE SODIUM 40 MG PO TBEC: 40.0000 mg | DELAYED_RELEASE_TABLET | Freq: Every day | ORAL | Status: DC | PRN

## 2022-05-16 MED ORDER — VITAMIN D 25 MCG (1000 UNIT) PO TABS
1000.0000 [IU] | ORAL_TABLET | Freq: Every day | ORAL | Status: DC
  Administered 2022-05-16 – 2022-05-17 (×2): 1000 [IU] via ORAL
  Filled 2022-05-16 (×2): qty 1

## 2022-05-16 MED ORDER — SODIUM CHLORIDE 0.9 % IV BOLUS (SEPSIS)
1000.0000 mL | Freq: Once | INTRAVENOUS | Status: AC
  Administered 2022-05-16: 1000 mL via INTRAVENOUS

## 2022-05-16 MED ORDER — CALCIUM CARBONATE ANTACID 500 MG PO CHEW: 1.0000 | CHEWABLE_TABLET | Freq: Every day | ORAL | Status: DC | PRN

## 2022-05-16 MED ORDER — METOPROLOL TARTRATE 25 MG PO TABS: 12.5000 mg | ORAL_TABLET | Freq: Two times a day (BID) | ORAL | Status: DC | PRN

## 2022-05-16 MED ORDER — ONDANSETRON HCL 4 MG/2ML IJ SOLN: 4.0000 mg | Freq: Three times a day (TID) | INTRAMUSCULAR | Status: DC | PRN

## 2022-05-16 MED ORDER — OMEGA-3 FATTY ACIDS 1000 MG PO CAPS: 1.0000 g | ORAL_CAPSULE | Freq: Every day | ORAL | Status: DC

## 2022-05-16 MED ORDER — ACETAMINOPHEN 500 MG PO TABS
1000.0000 mg | ORAL_TABLET | Freq: Once | ORAL | Status: AC
  Administered 2022-05-16: 1000 mg via ORAL
  Filled 2022-05-16: qty 2

## 2022-05-16 MED ORDER — FAMOTIDINE 20 MG PO TABS
20.0000 mg | ORAL_TABLET | Freq: Two times a day (BID) | ORAL | Status: DC
  Administered 2022-05-16 – 2022-05-17 (×3): 20 mg via ORAL
  Filled 2022-05-16 (×3): qty 1

## 2022-05-16 MED ORDER — ASPIRIN 81 MG PO CHEW
81.0000 mg | CHEWABLE_TABLET | Freq: Every day | ORAL | Status: DC
Start: 2022-05-16 — End: 2022-05-17
  Administered 2022-05-16 – 2022-05-17 (×2): 81 mg via ORAL
  Filled 2022-05-16 (×2): qty 1

## 2022-05-16 MED ORDER — ENOXAPARIN SODIUM 30 MG/0.3ML IJ SOSY
30.0000 mg | PREFILLED_SYRINGE | INTRAMUSCULAR | Status: DC
  Administered 2022-05-16: 30 mg via SUBCUTANEOUS
  Filled 2022-05-16: qty 0.3

## 2022-05-16 MED ORDER — CALCIUM 500-125 MG-UNIT PO TABS: 1.0000 | ORAL_TABLET | Freq: Every day | ORAL | Status: DC

## 2022-05-16 MED ORDER — DIPHENHYDRAMINE HCL 50 MG/ML IJ SOLN
12.5000 mg | Freq: Two times a day (BID) | INTRAMUSCULAR | Status: DC
  Administered 2022-05-16 – 2022-05-17 (×3): 12.5 mg via INTRAVENOUS
  Filled 2022-05-16 (×3): qty 1

## 2022-05-16 MED ORDER — SIMVASTATIN 20 MG PO TABS
40.0000 mg | ORAL_TABLET | Freq: Every evening | ORAL | Status: DC
  Administered 2022-05-16: 40 mg via ORAL
  Filled 2022-05-16: qty 2

## 2022-05-16 NOTE — Assessment & Plan Note (Addendum)
-   Zocor 

## 2022-05-16 NOTE — Assessment & Plan Note (Signed)
-  see above -Solu-Medrol 40 mg daily -Oral Pepcid 20 mg twice daily -Bilateral 12.5 mg twice daily

## 2022-05-16 NOTE — Assessment & Plan Note (Addendum)
Patient meets SIRS criteria for SIRS with fever of 102.1, heart rate 111 and RR 24.  Lactic acid is elevated 2.6.  No clear source of infection identified.  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  Other differential diagnosis include Lyme's disease and [facility] spotted fever given rash. I consulted Dr. Linus Salmons of ID by phone. Per Dr. Linus Salmons, this is less likely to be tick-born disease, but he agreed to continue doxycycline.  Patient received 1 dose of cefepime and vancomycin in ED, will not continue Vanco and cefepime.  -Admitted to telemetry bed as inpatient -Follow-up of blood culture, urine culture -will get Procalcitonin and trend lactic acid levels -IVF: 1.5L of NS bolus in ED, followed by 75 cc/h  -will check Lyme PCR and RMSF IgG and IgM. -Doxycycline 100 mg twice daily

## 2022-05-16 NOTE — Assessment & Plan Note (Signed)
Recent baseline creatinine 1.67.  His creatinine is 2.16, BUN 33.  Likely due to dehydration -IV fluid as above -Avoid using renal toxic medications

## 2022-05-16 NOTE — Progress Notes (Signed)
PHARMACIST - PHYSICIAN COMMUNICATION  CONCERNING:  Enoxaparin (Lovenox) for DVT Prophylaxis   DESCRIPTION: Patient was prescribed enoxaprin '40mg'$  q24 hours for VTE prophylaxis.   Filed Weights   05/16/22 0804  Weight: 63.5 kg (140 lb)    Body mass index is 24.03 kg/m.  Estimated Creatinine Clearance: 21.7 mL/min (A) (by C-G formula based on SCr of 2.16 mg/dL (H)).  Patient is candidate for enoxaparin '30mg'$  every 24 hours based on CrCl <19m/min or Weight <45kg  RECOMMENDATION: [facility]  Patient is now receiving enoxaparin 30 mg every 24 hours    MDarnelle Bos PharmD Clinical Pharmacist  05/16/2022 11:08 AM

## 2022-05-16 NOTE — Plan of Care (Signed)

## 2022-05-16 NOTE — ED Provider Notes (Signed)
[facility] Provider Note    Event Date/Time   First MD Initiated Contact with Patient 05/16/22 0818     (approximate)   History   Chief Complaint: Weakness and Rash   HPI  Kenneth Ross is a 86 y.o. male with a history of GERD, renal cell carcinoma, CKD, who started treatment for H. pylori with omeprazole, amoxicillin, rifabutin 9 days ago who now comes the ED complaining of generalized weakness and skin rash for the last 2 days.  Only known medication allergies are ibuprofen and IV contrast dye from exposure in the 1980s.  Denies shortness of breath or difficulty swallowing.  No wheezing.  No vomiting or diarrhea.  Denies pain, denies itching.  Denies rectal pain, denies black or bloody bowel movements.     Physical Exam   Triage Vital Signs: ED Triage Vitals  Enc Vitals Group     BP 05/16/22 0801 109/64     Pulse Rate 05/16/22 0801 (!) 111     Resp 05/16/22 0801 20     Temp 05/16/22 0801 (!) 100.6 F (38.1 C)     Temp Source 05/16/22 0801 Oral     SpO2 05/16/22 0801 94 %     Weight 05/16/22 0804 140 lb (63.5 kg)     Height 05/16/22 0804 '5\' 4"'$  (1.626 m)     Head Circumference --      Peak Flow --      Pain Score 05/16/22 0804 0     Pain Loc --      Pain Edu? --      Excl. in [place]? --     Most recent vital signs: Vitals:   05/16/22 0900 05/16/22 0926  BP: 111/63   Pulse: 94 88  Resp: (!) 24   Temp:  98.6 F (37 C)  SpO2: 97%     General: Awake, no distress.  CV:  Good peripheral perfusion.  Tachycardia, heart rate 110 Resp:  Normal effort.  Clear to auscultation bilaterally.  No crackles or wheezing Abd:  No distention.  Soft and nontender Other:  Diffuse erythematous eruption.  On the lower extremities he has nonblanching petechiae.  On the trunk and upper extremities he has a confluent morbilliform rash which is blanching.  Nikolsky negative, no blisters/bullae.  No intraoral lesions.   ED Results / Procedures / Treatments    Labs (all labs ordered are listed, but only abnormal results are displayed) Labs Reviewed  LACTIC ACID, PLASMA - Abnormal; Notable for the following components:      Result Value   Lactic Acid, Venous 2.6 (*)    All other components within normal limits  COMPREHENSIVE METABOLIC PANEL - Abnormal; Notable for the following components:   CO2 21 (*)    Glucose, Bld 146 (*)    BUN 33 (*)    Creatinine, Ser 2.16 (*)    Calcium 8.5 (*)    GFR, Estimated 30 (*)    All other components within normal limits  CBC WITH DIFFERENTIAL/PLATELET - Abnormal; Notable for the following components:   RBC 3.89 (*)    Hemoglobin 12.1 (*)    HCT 37.3 (*)    Lymphs Abs 0.4 (*)    Abs Immature Granulocytes 0.13 (*)    All other components within normal limits  APTT - Abnormal; Notable for the following components:   aPTT 40 (*)    All other components within normal limits  URINALYSIS, COMPLETE (UACMP) WITH MICROSCOPIC - Abnormal; Notable for  the following components:   Color, Urine YELLOW (*)    APPearance CLOUDY (*)    All other components within normal limits  RESP PANEL BY RT-PCR (FLU A&B, COVID) ARPGX2  CULTURE, BLOOD (ROUTINE X 2)  CULTURE, BLOOD (ROUTINE X 2)  URINE CULTURE  PROTIME-INR  LACTIC ACID, PLASMA  LIPASE, BLOOD  TROPONIN I (HIGH SENSITIVITY)     EKG Interpreted by me Sinus tachycardia rate 108.  Normal axis and intervals.  Poor R wave progression.  Normal ST segments and T waves.   RADIOLOGY Chest x-ray viewed and interpreted by me, appears normal.  Radiology report reviewed   PROCEDURES:  .Critical Care  Performed by: Carrie Mew, MD Authorized by: Carrie Mew, MD   Critical care provider statement:    Critical care time (minutes):  35   Critical care time was exclusive of:  Separately billable procedures and treating other patients   Critical care was necessary to treat or prevent imminent or life-threatening deterioration of the following  conditions:  Sepsis and toxidrome   Critical care was time spent personally by me on the following activities:  Development of treatment plan with patient or surrogate, discussions with consultants, evaluation of patient's response to treatment, examination of patient, obtaining history from patient or surrogate, ordering and performing treatments and interventions, ordering and review of laboratory studies, ordering and review of radiographic studies, pulse oximetry, re-evaluation of patient's condition and review of old charts   Care discussed with: admitting provider   Comments:        .1-3 Lead EKG Interpretation  Performed by: Carrie Mew, MD Authorized by: Carrie Mew, MD     Interpretation: abnormal     ECG rate:  110   ECG rate assessment: tachycardic     Rhythm: sinus tachycardia     Ectopy: none     Conduction: normal      MEDICATIONS ORDERED IN ED: Medications  vancomycin (VANCOCIN) IVPB 1000 mg/200 mL premix (1,000 mg Intravenous New Bag/Given 05/16/22 0925)  sodium chloride 0.9 % bolus 1,000 mL (1,000 mLs Intravenous New Bag/Given 05/16/22 0844)  acetaminophen (TYLENOL) tablet 1,000 mg (1,000 mg Oral Given 05/16/22 0844)  ceFEPIme (MAXIPIME) 2 g in sodium chloride 0.9 % 100 mL IVPB (0 g Intravenous Stopped 05/16/22 0921)     IMPRESSION / MDM / [place] / ED COURSE  I reviewed the triage vital signs and the nursing notes.                              Differential diagnosis includes, but is not limited to, sepsis, pneumonia, UTI, DRESS syndrome  Patient's presentation is most consistent with acute presentation with potential threat to life or bodily function.  Patient presents with fever, tachycardia, generalized weakness which began about 1 week after starting medication for H. pylori which includes omeprazole, amoxicillin, and rifabutin.  I have suspicion for drug eruption with systemic symptoms.  We will check labs, chest x-ray, give IV fluids.   We will need to admit for further evaluation and clinical monitoring pending culture data.       FINAL CLINICAL IMPRESSION(S) / ED DIAGNOSES   Final diagnoses:  DRESS syndrome     Rx / DC Orders   ED Discharge Orders     None        Note:  This document was prepared using Dragon voice recognition software and may include unintentional dictation errors.   Carrie Mew,  MD 05/16/22 1019

## 2022-05-16 NOTE — ED Triage Notes (Signed)
Pt via POV from home. Pt c/o weakness and rash. Pt states that he has been on Talicia since last Wednesday for a intestinal infection. Pt states he noticed a rash all over for the past couple of days, denies any pain or itchiness with a rash. Pt states he has been having intermittent joint pain. Denies pain at this time. Pt is A&Ox4 and NAD

## 2022-05-16 NOTE — Progress Notes (Signed)
CODE SEPSIS - PHARMACY COMMUNICATION  **Broad Spectrum Antibiotics should be administered within 1 hour of Sepsis diagnosis**  Time Code Sepsis Called/Page Received: 0828  Antibiotics Ordered: cefepime, vanc  Time of 1st antibiotic administration: [phone]  Additional action taken by pharmacy:    If necessary, Name of Provider/Nurse Contacted:      Noralee Space ,PharmD Clinical Pharmacist  05/16/2022  8:59 AM

## 2022-05-16 NOTE — ED Notes (Signed)
ED TO INPATIENT HANDOFF REPORT  ED Nurse Name and Phone #: Baxter Flattery, RN  S Name/Age/Gender Kenneth Ross 86 y.o. male Room/Bed: ED11A/ED11A  Code Status   Code Status: Not on file  Home/SNF/Other Home Patient oriented to: self, place, time, and situation Is this baseline? Yes   Triage Complete: Triage complete  Chief Complaint SIRS (systemic inflammatory response syndrome) (HCC) [R65.10]  Triage Note Pt via POV from home. Pt c/o weakness and rash. Pt states that he has been on Talicia since last Wednesday for a intestinal infection. Pt states he noticed a rash all over for the past couple of days, denies any pain or itchiness with a rash. Pt states he has been having intermittent joint pain. Denies pain at this time. Pt is A&Ox4 and NAD   Allergies Allergies  Allergen Reactions   Ibuprofen     Caution re: kidney function   Iodinated Contrast Media Nausea And Vomiting    IV dye    Level of Care/Admitting Diagnosis ED Disposition     ED Disposition  Admit   Condition  --   Comment  Hospital Area: [facility] [[id number]]  Level of Care: Telemetry Medical [[id number]]  Covid Evaluation: Confirmed COVID Negative  Diagnosis: SIRS (systemic inflammatory response syndrome) [facility]) [[id number]]  Admitting Physician: Ivor Costa [[id number]]  Attending Physician: Ivor Costa [phone]  Estimated length of stay: past midnight tomorrow  Certification:: I certify this patient will need inpatient services for at least 2 midnights          B Medical/Surgery History Past Medical History:  Diagnosis Date   Allergic rhinitis, seasonal    Allergy    Bell's palsy    with L eyelid droop at baseline as of 2017   Blood transfusion without reported diagnosis    Cancer of kidney ([place])    Cataract    CKD (chronic kidney disease), stage III ([place])    followed by pcp   ED (erectile dysfunction)    GERD (gastroesophageal reflux disease)    occasional take tums   History of renal  cell carcinoma    1986  s/p  left nephrectomy   Hyperlipidemia    Idiopathic scoliosis    Pneumonia    Prostate cancer [facility]) urologist-- dr dahlstedt/  oncologist-- dr [name]   dx 09-07-2019--- Stage T1c,  Gleason 4+3   Seasonal allergies    Sleep apnea    Cpap   Solitary right kidney    s/p  left nephrecotmy 1986   Tachycardia followed by pcp   HR 100 @ pcp office --- pt referred to cardiology for consult w/ dr t. Rockey Situ note in epic 12-14-2017, by pcp (dr Damita Dunnings) no work up done,  pt has lopressor as needed if heart rate is elevated as pt feels appropiate   Past Surgical History:  Procedure Laterality Date   CATARACT EXTRACTION Left 06/10/2018   Dr. Valetta Close   CATARACT EXTRACTION Right 2021   CATARACT EXTRACTION W/ INTRAOCULAR LENS IMPLANT Left    LAMINECTOMY  1993   L3-5   NEPHRECTOMY Left 10/1985   RADIOACTIVE SEED IMPLANT N/A 10/21/2019   Procedure: RADIOACTIVE SEED IMPLANT/BRACHYTHERAPY IMPLANT;  Surgeon: Franchot Gallo, MD;  Location: [facility];  Service: Urology;  Laterality: N/A;  65 seeds implanted   SHOULDER OPEN ROTATOR CUFF REPAIR  12/1998   Right   SHOULDER OPEN ROTATOR CUFF REPAIR Bilateral 08/2001   SPACE OAR INSTILLATION N/A 10/21/2019   Procedure: SPACE OAR INSTILLATION;  Surgeon: Diona Fanti,  Annie Main, MD;  Location[facility];  Service: Urology;  Laterality: N/A;     A IV Location/Drains/Wounds Patient Lines/Drains/Airways Status     Active Line/Drains/Airways     Name Placement date Placement time Site Days   Peripheral IV 05/16/22 20 G 1" Anterior;Right Forearm 05/16/22  0811  Forearm  less than 1   Peripheral IV 05/16/22 20 G Anterior;Left;Proximal Forearm 05/16/22  0858  Forearm  less than 1   Incision (Closed) 10/21/19 Perineum Other (Comment) 10/21/19  1214  -- 938            Intake/Output Last 24 hours No intake or output data in the 24 hours ending 05/16/22 1043  Labs/Imaging Results for orders placed  or performed during the hospital encounter of 05/16/22 (from the past 48 hour(s))  Lactic acid, plasma     Status: Abnormal   Collection Time: 05/16/22  8:14 AM  Result Value Ref Range   Lactic Acid, Venous 2.6 (HH) 0.5 - 1.9 mmol/L    Comment: CRITICAL RESULT CALLED TO, READ BACK BY AND VERIFIED WITH Yaritzel Stange DEL ROSSO AT 1002 05/16/22.PMF Performed at [facility], [address] [place], [place] [address]   Comprehensive metabolic panel     Status: Abnormal   Collection Time: 05/16/22  8:14 AM  Result Value Ref Range   Sodium 136 135 - 145 mmol/L   Potassium 4.3 3.5 - 5.1 mmol/L   Chloride 106 98 - 111 mmol/L   CO2 21 (L) 22 - 32 mmol/L   Glucose, Bld 146 (H) 70 - 99 mg/dL    Comment: Glucose reference range applies only to samples taken after fasting for at least 8 hours.   BUN 33 (H) 8 - 23 mg/dL   Creatinine, Ser 2.16 (H) 0.61 - 1.24 mg/dL   Calcium 8.5 (L) 8.9 - 10.3 mg/dL   Total Protein 6.6 6.5 - 8.1 g/dL   Albumin 3.8 3.5 - 5.0 g/dL   AST 32 15 - 41 U/L   ALT 39 0 - 44 U/L   Alkaline Phosphatase 58 38 - 126 U/L   Total Bilirubin 0.6 0.3 - 1.2 mg/dL   GFR, Estimated 30 (L) >60 mL/min    Comment: (NOTE) Calculated using the CKD-EPI Creatinine Equation (2021)    Anion gap 9 5 - 15    Comment: Performed at [facility], [address] [place], [place] [address]  CBC with Differential     Status: Abnormal   Collection Time: 05/16/22  8:14 AM  Result Value Ref Range   WBC 6.1 4.0 - 10.5 K/uL   RBC 3.89 (L) 4.22 - 5.81 MIL/uL   Hemoglobin 12.1 (L) 13.0 - 17.0 g/dL   HCT 37.3 (L) 39.0 - 52.0 %   MCV 95.9 80.0 - 100.0 fL   MCH 31.1 26.0 - 34.0 pg   MCHC 32.4 30.0 - 36.0 g/dL   RDW 13.7 11.5 - 15.5 %   Platelets 178 150 - 400 K/uL   nRBC 0.0 0.0 - 0.2 %   Neutrophils Relative % 78 %   Neutro Abs 4.8 1.7 - 7.7 K/uL   Lymphocytes Relative 7 %   Lymphs Abs 0.4 (L) 0.7 - 4.0 K/uL   Monocytes Relative 10 %   Monocytes Absolute 0.6 0.1 - 1.0 K/uL    Eosinophils Relative 2 %   Eosinophils Absolute 0.1 0.0 - 0.5 K/uL   Basophils Relative 1 %   Basophils Absolute 0.0 0.0 - 0.1 K/uL  Immature Granulocytes 2 %   Abs Immature Granulocytes 0.13 (H) 0.00 - 0.07 K/uL    Comment: Performed at [facility], [address] [place], [place] [address]  Protime-INR     Status: None   Collection Time: 05/16/22  8:14 AM  Result Value Ref Range   Prothrombin Time 14.4 11.4 - 15.2 seconds   INR 1.1 0.8 - 1.2    Comment: (NOTE) INR goal varies based on device and disease states. Performed at [facility], [address] [place], [place] [address]   APTT     Status: Abnormal   Collection Time: 05/16/22  8:14 AM  Result Value Ref Range   aPTT 40 (H) 24 - 36 seconds    Comment:        IF BASELINE aPTT IS ELEVATED, SUGGEST PATIENT RISK ASSESSMENT BE USED TO DETERMINE APPROPRIATE ANTICOAGULANT THERAPY. Performed at [facility], [address] [place], [place] [address]   Resp Panel by RT-PCR (Flu A&B, Covid) Anterior Nasal Swab     Status: None   Collection Time: 05/16/22  8:46 AM   Specimen: Anterior Nasal Swab  Result Value Ref Range   SARS Coronavirus 2 by RT PCR NEGATIVE NEGATIVE    Comment: (NOTE) SARS-CoV-2 target nucleic acids are NOT DETECTED.  The SARS-CoV-2 RNA is generally detectable in upper respiratory specimens during the acute phase of infection. The lowest concentration of SARS-CoV-2 viral copies this assay can detect is 138 copies/mL. A negative result does not preclude SARS-Cov-2 infection and should not be used as the sole basis for treatment or other patient management decisions. A negative result may occur with  improper specimen collection/handling, submission of specimen other than nasopharyngeal swab, presence of viral mutation(s) within the areas targeted by this assay, and inadequate number of viral copies(<138 copies/mL). A negative result must be combined with clinical  observations, patient history, and epidemiological information. The expected result is Negative.  Fact Sheet for Patients:  EntrepreneurPulse.com.au  Fact Sheet for Healthcare Providers:  IncredibleEmployment.be  This test is no t yet approved or cleared by the [place] FDA and  has been authorized for detection and/or diagnosis of SARS-CoV-2 by FDA under an Emergency Use Authorization (EUA). This EUA will remain  in effect (meaning this test can be used) for the duration of the COVID-19 declaration under Section 564(b)(1) of the Act, 21 U.S.C.section 360bbb-3(b)(1), unless the authorization is terminated  or revoked sooner.       Influenza A by PCR NEGATIVE NEGATIVE   Influenza B by PCR NEGATIVE NEGATIVE    Comment: (NOTE) The Xpert Xpress SARS-CoV-2/FLU/RSV plus assay is intended as an aid in the diagnosis of influenza from Nasopharyngeal swab specimens and should not be used as a sole basis for treatment. Nasal washings and aspirates are unacceptable for Xpert Xpress SARS-CoV-2/FLU/RSV testing.  Fact Sheet for Patients: EntrepreneurPulse.com.au  Fact Sheet for Healthcare Providers: IncredibleEmployment.be  This test is not yet approved or cleared by the [place] FDA and has been authorized for detection and/or diagnosis of SARS-CoV-2 by FDA under an Emergency Use Authorization (EUA). This EUA will remain in effect (meaning this test can be used) for the duration of the COVID-19 declaration under Section 564(b)(1) of the Act, 21 U.S.C. section 360bbb-3(b)(1), unless the authorization is terminated or revoked.  Performed at [facility], [address] [place], [place] [address]   Urinalysis, Complete w Microscopic Urine, Clean Catch     Status: Abnormal   Collection Time:  05/16/22  8:46 AM  Result Value Ref Range   Color, Urine YELLOW (A) YELLOW   APPearance CLOUDY (A) CLEAR    Specific Gravity, Urine 1.019 1.005 - 1.030   pH 5.0 5.0 - 8.0   Glucose, UA NEGATIVE NEGATIVE mg/dL   Hgb urine dipstick NEGATIVE NEGATIVE   Bilirubin Urine NEGATIVE NEGATIVE   Ketones, ur NEGATIVE NEGATIVE mg/dL   Protein, ur NEGATIVE NEGATIVE mg/dL   Nitrite NEGATIVE NEGATIVE   Leukocytes,Ua NEGATIVE NEGATIVE   RBC / HPF 0-5 0 - 5 RBC/hpf   WBC, UA 0-5 0 - 5 WBC/hpf   Bacteria, UA NONE SEEN NONE SEEN   Squamous Epithelial / LPF NONE SEEN 0 - 5   Mucus PRESENT     Comment: Performed at [facility], [address] [place], [place] [address]   DG Chest Port 1 View  Result Date: 05/16/2022 CLINICAL DATA:  Provided history: Questionable sepsis-evaluate for abnormality. Rash. EXAM: PORTABLE CHEST 1 VIEW COMPARISON:  Prior chest radiographs 09/22/2019 and earlier. FINDINGS: Heart size within normal limits. Chronic elevation of the left hemidiaphragm. Bandlike opacities within the left lung base, likely reflecting atelectasis. No appreciable airspace consolidation. No evidence of pleural effusion or pneumothorax. No acute bony abnormality identified. Soft tissue anchors within the proximal humeri. Upper thoracic dextrocurvature. Surgical clips within the upper abdomen. IMPRESSION: No appreciable airspace consolidation. Chronic elevation of the left hemidiaphragm. Bandlike opacities within the left lung base, likely reflecting atelectasis. Electronically Signed   By: Kellie Simmering D.O.   On: 05/16/2022 08:51    Pending Labs Unresulted Labs (From admission, onward)     Start     Ordered   05/16/22 0837  Lipase, blood  Add-on,   AD        05/16/22 4163   05/16/22 0828  Lactic acid, plasma  (Septic presentation on arrival (screening labs, nursing and treatment orders for obvious sepsis))  Now then every 2 hours,   STAT      05/16/22 0832   05/16/22 0828  Blood Culture (routine x 2)  (Septic presentation on arrival (screening labs, nursing and treatment orders for obvious sepsis))   BLOOD CULTURE X 2,   STAT      05/16/22 0832   05/16/22 0828  Urine Culture  (Septic presentation on arrival (screening labs, nursing and treatment orders for obvious sepsis))  ONCE - URGENT,   URGENT       Question:  Indication  Answer:  Sepsis   05/16/22 0832            Vitals/Pain Today's Vitals   05/16/22 0804 05/16/22 0813 05/16/22 0900 05/16/22 0926  BP:   111/63   Pulse:   94 88  Resp:   (!) 24   Temp:  (!) 102.1 F (38.9 C)  98.6 F (37 C)  TempSrc:  (S) Rectal  Oral  SpO2:   97%   Weight: 140 lb (63.5 kg)     Height: '5\' 4"'$  (1.626 m)     PainSc: 0-No pain       Isolation Precautions No active isolations  Medications Medications  sodium chloride 0.9 % bolus 1,000 mL (0 mLs Intravenous Stopped 05/16/22 1026)  acetaminophen (TYLENOL) tablet 1,000 mg (1,000 mg Oral Given 05/16/22 0844)  ceFEPIme (MAXIPIME) 2 g in sodium chloride 0.9 % 100 mL IVPB (0 g Intravenous Stopped 05/16/22 0921)  vancomycin (VANCOCIN) IVPB 1000 mg/200 mL premix (0 mg Intravenous Stopped 05/16/22 1026)    Mobility walks Low fall  risk   Focused Assessments Cardiac Assessment Handoff:    No results found for: "CKTOTAL", "CKMB", "CKMBINDEX", "TROPONINI" No results found for: "DDIMER" Does the Patient currently have chest pain? No    R Recommendations: See Admitting Provider Note  Report given to:   Additional Notes:

## 2022-05-16 NOTE — H&P (Signed)
History and Physical    Kenneth Ross AST:[id number] DOB: 10-03-1936 DOA: 05/16/2022  Referring MD/NP/PA:   PCP: Tonia Ghent, MD   Patient coming from:  The patient is coming from home.  At baseline, pt is independent for most of ADL.        Chief Complaint: rash, fever  HPI: Kenneth Ross is a 86 y.o. male with medical history significant of hyperlipidemia, GERD, right solitary kidney (s/p of left nephrectomy due to renal cancer), OSA on CPAP, prostate cancer, CKD-3A, Bell's palsy, tachycardia, H. pylori infection, who presents with rash and fever  Patient states that she started treatment for H. pylori with Talicia (omeprazole, amoxicillin, rifabutin) 9 days ago. She developed diffused erythematous rashes in whole body 2 days ago, involving soles, but not involving palms. No mucosa ulcer.  No blisters or bullae. He has erythematous petechial rashes on extremities which are nonblanching and confluent erythematous rashes on trunk which are blanching.  Denies recent hiking. Associated with generalized weakness.  Patient also has chills and fever.  His temperature is 102.1 in ED today. Patient has chronic intermittent mild dry cough, which has not changed.  Denies chest pain or shortness breath.  No nausea vomiting, diarrhea or abdominal pain.  No symptoms of UTI.  Data Reviewed and ED Course: pt was found to have WBC 6.1, neutrophils 78%, lymphocytes 7%, eosinophilia 2% (0.1), lactic acid 2.6, INR 1.1, PTT 40, negative COVID PCR, worsening renal function, blood pressure 111/63, heart rate 111, RR 24, oxygen saturation 94% on room air.  Chest x-ray showed left diaphragm elevation and bandlike opacity in left base.  Patient is admitted to telemetry bed as inpatient. Consulted Dr. Linus Salmons of ID by phone  EKG: I have personally reviewed.  Sinus rhythm, QTc 426, LAE, nonspecific T wave change.  Review of Systems:   General: has fevers, chills, no body weight gain, has fatigue HEENT: no  blurry vision, hearing changes or sore throat Respiratory: no dyspnea, has mild coughing, no wheezing CV: no chest pain, no palpitations GI: no nausea, vomiting, abdominal pain, diarrhea, constipation GU: no dysuria, burning on urination, increased urinary frequency, hematuria  Ext: no leg edema Neuro: no unilateral weakness, numbness, or tingling, no vision change or hearing loss Skin: has rash MSK: No muscle spasm, no deformity, no limitation of range of movement in spin Heme: No easy bruising.  Travel history: No recent long distant travel.   Allergy:  Allergies  Allergen Reactions   Ibuprofen     Caution re: kidney function   Iodinated Contrast Media Nausea And Vomiting    IV dye    Past Medical History:  Diagnosis Date   Allergic rhinitis, seasonal    Allergy    Bell's palsy    with L eyelid droop at baseline as of 2017   Blood transfusion without reported diagnosis    Cancer of kidney ([place])    Cataract    CKD (chronic kidney disease), stage III ([place])    followed by pcp   ED (erectile dysfunction)    GERD (gastroesophageal reflux disease)    occasional take tums   History of renal cell carcinoma    1986  s/p  left nephrectomy   Hyperlipidemia    Idiopathic scoliosis    Pneumonia    Prostate cancer [facility]) urologist-- dr dahlstedt/  oncologist-- dr [name]   dx 09-07-2019--- Stage T1c,  Gleason 4+3   Seasonal allergies    Sleep apnea    Cpap  Solitary right kidney    s/p  left nephrecotmy 1986   Tachycardia followed by pcp   HR 100 @ pcp office --- pt referred to cardiology for consult w/ dr t. Rockey Situ note in epic 12-14-2017, by pcp (dr Damita Dunnings) no work up done,  pt has lopressor as needed if heart rate is elevated as pt feels appropiate    Past Surgical History:  Procedure Laterality Date   CATARACT EXTRACTION Left 06/10/2018   Dr. Valetta Close   CATARACT EXTRACTION Right 2021   CATARACT EXTRACTION W/ [place]   L3-5    NEPHRECTOMY Left 10/1985   RADIOACTIVE SEED IMPLANT N/A 10/21/2019   Procedure: RADIOACTIVE SEED IMPLANT/BRACHYTHERAPY IMPLANT;  Surgeon: Franchot Gallo, MD;  [facility]  Service: Urology;  Laterality: N/A;  65 seeds implanted   SHOULDER OPEN ROTATOR CUFF REPAIR  12/1998   Right   SHOULDER OPEN ROTATOR CUFF REPAIR Bilateral 08/2001   SPACE OAR INSTILLATION N/A 10/21/2019   Procedure: SPACE OAR INSTILLATION;  Surgeon: Franchot Gallo, MD;  Location: [facility];  Service: Urology;  Laterality: N/A;    Social History:  reports that he has never smoked. He has never used smokeless tobacco. He reports that he does not drink alcohol and does not use drugs.  Family History:  Family History  Problem Relation Age of Onset   Cancer Mother        breast   Stroke Mother        mets stroke  55   Fibromyalgia Mother    Arthritis Mother    Gallbladder disease Mother    Hypertension Mother    Heart disease Father    Cancer Father        lung and prostate metastases   Stroke Father        mini strokes   Prostate cancer Father    Gallbladder disease Father    Hypertension Father    Liver cancer Sister    Lupus Sister    Hepatitis C Sister    Diabetes Sister    GER disease Sister    Esophageal cancer Paternal Aunt    Cancer Maternal Grandmother        type unknown   Gallbladder disease Paternal Grandmother    Stroke Paternal Grandfather    COPD Daughter    Bronchitis Daughter    Liver disease Daughter    Colon cancer Cousin    Rectal cancer Cousin    Bladder Cancer Cousin    Colon polyps Neg Hx    Stomach cancer Neg Hx      Prior to Admission medications   Medication Sig Start Date End Date Taking? Authorizing Provider  Amoxicill-Rifabutin-Omeprazole (TALICIA) 629-47.6-54 MG CPDR Take 4 tablets by mouth 3 (three) times daily.    [provider]  aspirin 81 MG tablet Take 81 mg by mouth daily.      [provider]   Azelastine HCl 0.15 % SOLN Place 1 spray into the nose 2 (two) times daily as needed (Allergies). 07/17/21   Chesley Mires, MD  Calcium 500-125 MG-UNIT TABS Take 1 tablet by mouth daily.      [provider]  calcium carbonate (TUMS - DOSED IN MG ELEMENTAL CALCIUM) 500 MG chewable tablet Chew 1 tablet by mouth daily as needed for indigestion or heartburn.    [provider]  Cholecalciferol (VITAMIN D) 1000 UNITS capsule Take 1,000 Units by mouth daily.  [provider]  fish oil-omega-3 fatty acids 1000 MG capsule Take 1 g by mouth daily.    [provider]  fluticasone (FLONASE) 50 MCG/ACT nasal spray Place 2 sprays into both nostrils daily. 09/09/18   Tonia Ghent, MD  loratadine (CLARITIN) 10 MG tablet Take 10 mg by mouth daily.     [provider]  metoprolol tartrate (LOPRESSOR) 25 MG tablet Take 0.5-1 tablets (12.5-25 mg total) by mouth 2 (two) times daily as needed. 10/26/17   Tonia Ghent, MD  Multiple Vitamin (MULTIVITAMIN) capsule Take 1 capsule by mouth daily.      [provider]  omeprazole (PRILOSEC) 20 MG capsule Take 1 capsule (20 mg total) by mouth daily. 09/09/21   Tonia Ghent, MD  sildenafil (REVATIO) 20 MG tablet TAKE 3 TO 5 TABLETS BY MOUTH ONCE DAILY AS NEEDED 09/09/21   Tonia Ghent, MD  simvastatin (ZOCOR) 40 MG tablet Take 1 tablet (40 mg total) by mouth every evening. 09/09/21   Tonia Ghent, MD    Physical Exam: Vitals:   05/16/22 0900 05/16/22 0926 05/16/22 1236 05/16/22 1552  BP: 111/63  (!) 99/58 104/60  Pulse: 94 88 73 71  Resp: (!) 24  18   Temp:  98.6 F (37 C) 97.9 F (36.6 C) 98.3 F (36.8 C)  TempSrc:  Oral    SpO2: 97%  98% 95%  Weight:      Height:       General: Not in acute distress HEENT:       Eyes: PERRL, EOMI, no scleral icterus.       ENT: No discharge from the ears and nose, no pharynx injection, no tonsillar enlargement.        Neck: No JVD, no bruit, no mass  felt. Heme: No neck lymph node enlargement. Cardiac: S1/S2, RRR, No murmurs, No gallops or rubs. Respiratory: No rales, wheezing, rhonchi or rubs. GI: Soft, nondistended, nontender, no rebound pain, no organomegaly, BS present. GU: No hematuria Ext: No pitting leg edema bilaterally. 1+DP/PT pulse bilaterally. Musculoskeletal: No joint deformities, No joint redness or warmth, no limitation of ROM in spin. Skin:  has erythematous petechial rashes on extremities which are nonblanching and confluent erythematous rashes on trunk which are blanching.  Nikolsky negative, no blisters/bullae.     Neuro: Alert, oriented X3, cranial nerves II-XII grossly intact, moves all extremities normally.  Psych: Patient is not psychotic, no suicidal or hemocidal ideation.  Labs on Admission: I have personally reviewed following labs and imaging studies  CBC: Recent Labs  Lab 05/16/22 0814  WBC 6.1  NEUTROABS 4.8  HGB 12.1*  HCT 37.3*  MCV 95.9  PLT 062   Basic Metabolic Panel: Recent Labs  Lab 05/16/22 0814  NA 136  K 4.3  CL 106  CO2 21*  GLUCOSE 146*  BUN 33*  CREATININE 2.16*  CALCIUM 8.5*   GFR: Estimated Creatinine Clearance: 21.7 mL/min (A) (by C-G formula based on SCr of 2.16 mg/dL (H)). Liver Function Tests: Recent Labs  Lab 05/16/22 0814  AST 32  ALT 39  ALKPHOS 58  BILITOT 0.6  PROT 6.6  ALBUMIN 3.8   Recent Labs  Lab 05/16/22 0814  LIPASE 36   No results for input(s): "AMMONIA" in the last 168 hours. Coagulation Profile: Recent Labs  Lab 05/16/22 0814  INR 1.1   Cardiac Enzymes: No results for input(s): "CKTOTAL", "CKMB", "CKMBINDEX", "TROPONINI" in the last 168 hours. BNP (last 3 results) No results  for input(s): "PROBNP" in the last 8760 hours. HbA1C: No results for input(s): "HGBA1C" in the last 72 hours. CBG: No results for input(s): "GLUCAP" in the last 168 hours. Lipid Profile: No results for input(s): "CHOL", "HDL", "LDLCALC", "TRIG", "CHOLHDL",  "LDLDIRECT" in the last 72 hours. Thyroid Function Tests: No results for input(s): "TSH", "T4TOTAL", "FREET4", "T3FREE", "THYROIDAB" in the last 72 hours. Anemia Panel: No results for input(s): "VITAMINB12", "FOLATE", "FERRITIN", "TIBC", "IRON", "RETICCTPCT" in the last 72 hours. Urine analysis:    Component Value Date/Time   COLORURINE YELLOW (A) 05/16/2022 0846   APPEARANCEUR CLOUDY (A) 05/16/2022 0846   LABSPEC 1.019 05/16/2022 0846   PHURINE 5.0 05/16/2022 0846   GLUCOSEU NEGATIVE 05/16/2022 0846   HGBUR NEGATIVE 05/16/2022 0846   HGBUR negative 08/06/2007 0000   BILIRUBINUR NEGATIVE 05/16/2022 0846   KETONESUR NEGATIVE 05/16/2022 0846   PROTEINUR NEGATIVE 05/16/2022 0846   NITRITE NEGATIVE 05/16/2022 0846   LEUKOCYTESUR NEGATIVE 05/16/2022 0846   Sepsis Labs: '@LABRCNTIP'$ (procalcitonin:4,lacticidven:4) ) Recent Results (from the past 240 hour(s))  Resp Panel by RT-PCR (Flu A&B, Covid) Anterior Nasal Swab     Status: None   Collection Time: 05/16/22  8:46 AM   Specimen: Anterior Nasal Swab  Result Value Ref Range Status   SARS Coronavirus 2 by RT PCR NEGATIVE NEGATIVE Final    Comment: (NOTE) SARS-CoV-2 target nucleic acids are NOT DETECTED.  The SARS-CoV-2 RNA is generally detectable in upper respiratory specimens during the acute phase of infection. The lowest concentration of SARS-CoV-2 viral copies this assay can detect is 138 copies/mL. A negative result does not preclude SARS-Cov-2 infection and should not be used as the sole basis for treatment or other patient management decisions. A negative result may occur with  improper specimen collection/handling, submission of specimen other than nasopharyngeal swab, presence of viral mutation(s) within the areas targeted by this assay, and inadequate number of viral copies(<138 copies/mL). A negative result must be combined with clinical observations, patient history, and epidemiological information. The expected result  is Negative.  Fact Sheet for Patients:  EntrepreneurPulse.com.au  Fact Sheet for Healthcare Providers:  IncredibleEmployment.be  This test is no t yet approved or cleared by the [place] FDA and  has been authorized for detection and/or diagnosis of SARS-CoV-2 by FDA under an Emergency Use Authorization (EUA). This EUA will remain  in effect (meaning this test can be used) for the duration of the COVID-19 declaration under Section 564(b)(1) of the Act, 21 U.S.C.section 360bbb-3(b)(1), unless the authorization is terminated  or revoked sooner.       Influenza A by PCR NEGATIVE NEGATIVE Final   Influenza B by PCR NEGATIVE NEGATIVE Final    Comment: (NOTE) The Xpert Xpress SARS-CoV-2/FLU/RSV plus assay is intended as an aid in the diagnosis of influenza from Nasopharyngeal swab specimens and should not be used as a sole basis for treatment. Nasal washings and aspirates are unacceptable for Xpert Xpress SARS-CoV-2/FLU/RSV testing.  Fact Sheet for Patients: EntrepreneurPulse.com.au  Fact Sheet for Healthcare Providers: IncredibleEmployment.be  This test is not yet approved or cleared by the [place] FDA and has been authorized for detection and/or diagnosis of SARS-CoV-2 by FDA under an Emergency Use Authorization (EUA). This EUA will remain in effect (meaning this test can be used) for the duration of the COVID-19 declaration under Section 564(b)(1) of the Act, 21 U.S.C. section 360bbb-3(b)(1), unless the authorization is terminated or revoked.  Performed at [facility], [address] [place], [place] [address]  Radiological Exams on Admission: DG Chest Port 1 View  Result Date: 05/16/2022 CLINICAL DATA:  Provided history: Questionable sepsis-evaluate for abnormality. Rash. EXAM: PORTABLE CHEST 1 VIEW COMPARISON:  Prior chest radiographs 09/22/2019 and earlier. FINDINGS: Heart  size within normal limits. Chronic elevation of the left hemidiaphragm. Bandlike opacities within the left lung base, likely reflecting atelectasis. No appreciable airspace consolidation. No evidence of pleural effusion or pneumothorax. No acute bony abnormality identified. Soft tissue anchors within the proximal humeri. Upper thoracic dextrocurvature. Surgical clips within the upper abdomen. IMPRESSION: No appreciable airspace consolidation. Chronic elevation of the left hemidiaphragm. Bandlike opacities within the left lung base, likely reflecting atelectasis. Electronically Signed   By: Kellie Simmering D.O.   On: 05/16/2022 08:51      Assessment/Plan Principal Problem:   SIRS (systemic inflammatory response syndrome) (HCC) Active Problems:   Rash   Acute renal failure superimposed on stage 3a chronic kidney disease (HCC)   Tachycardia   Hypercholesteremia   Sleep apnea   H. pylori infection    Assessment and Plan: * SIRS (systemic inflammatory response syndrome) ([place]) Patient meets SIRS criteria for SIRS with fever of 102.1, heart rate 111 and RR 24.  Lactic acid is elevated 2.6.  No clear source of infection identified.  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  Other differential diagnosis include Lyme's disease and [facility] spotted fever given rash. I consulted Dr. Linus Salmons of ID by phone. Per Dr. Linus Salmons, this is less likely to be tick-born disease, but he agreed to continue doxycycline.  Patient received 1 dose of cefepime and vancomycin in ED, will not continue Vanco and cefepime.  -Admitted to telemetry bed as inpatient -Follow-up of blood culture, urine culture -will get Procalcitonin and trend lactic acid levels -IVF: 1.5L of NS bolus in ED, followed by 75 cc/h  -will check Lyme PCR and RMSF IgG and IgM. -Doxycycline 100 mg twice daily    Rash -see above -Solu-Medrol 40 mg daily -Oral Pepcid 20 mg twice daily -Bilateral 12.5 mg twice daily  Acute renal failure  superimposed on stage 3a chronic kidney disease (HCC) Recent baseline creatinine 1.67.  His creatinine is 2.16, BUN 33.  Likely due to dehydration -IV fluid as above -Avoid using renal toxic medications  Tachycardia Patient has sinus tachycardia, which is chronic issue. -Continue metoprolol prn  Hypercholesteremia - Zocor  Sleep apnea -CPAP  H. pylori infection -will hold Talicia now           DVT ppx: SQ Lovenox  Code Status: Full code  Family Communication:  Yes, patient's  wife  at bed side.     Disposition Plan:  Anticipate discharge back to previous environment  Consults called:  Consulted Dr. Linus Salmons of ID.  Admission status and Level of care: Telemetry Medical:    Med-surg bed for obs as inpt    progressive unit for obs   as inpt      SDU/inpation         Severity of Illness:  The appropriate patient status for this patient is INPATIENT. Inpatient status is judged to be reasonable and necessary in order to provide the required intensity of service to ensure the patient's safety. The patient's presenting symptoms, physical exam findings, and initial radiographic and laboratory data in the context of their chronic comorbidities is felt to place them at high risk for further clinical deterioration. Furthermore, it is not anticipated that the patient will be medically stable for discharge from the hospital within 2 midnights of admission.   *  I certify that at the point of admission it is my clinical judgment that the patient will require inpatient hospital care spanning beyond 2 midnights from the point of admission due to high intensity of service, high risk for further deterioration and high frequency of surveillance required.*       Date of Service 05/16/2022    Ivor Costa [facility]   If 7PM-7AM, please contact night-coverage [email or web address] 05/16/2022, 4:48 PM

## 2022-05-16 NOTE — Assessment & Plan Note (Addendum)
Patient has sinus tachycardia, which is chronic issue. -Continue metoprolol prn

## 2022-05-16 NOTE — Assessment & Plan Note (Signed)
CPAP.  

## 2022-05-16 NOTE — Progress Notes (Signed)
[facility]link following for sepsis protocol. 

## 2022-05-16 NOTE — Assessment & Plan Note (Signed)
-  will hold Talicia now

## 2022-05-17 ENCOUNTER — Telehealth: Payer: Self-pay | Admitting: Family Medicine

## 2022-05-17 DIAGNOSIS — R6510 Systemic inflammatory response syndrome (SIRS) of non-infectious origin without acute organ dysfunction: Secondary | ICD-10-CM | POA: Diagnosis not present

## 2022-05-17 LAB — CBC
HCT: 33.1 % — ABNORMAL LOW (ref 39.0–52.0)
Hemoglobin: 10.8 g/dL — ABNORMAL LOW (ref 13.0–17.0)
MCH: 31.4 pg (ref 26.0–34.0)
MCHC: 32.6 g/dL (ref 30.0–36.0)
MCV: 96.2 fL (ref 80.0–100.0)
Platelets: 161 10*3/uL (ref 150–400)
RBC: 3.44 MIL/uL — ABNORMAL LOW (ref 4.22–5.81)
RDW: 13.8 % (ref 11.5–15.5)
WBC: 6.5 10*3/uL (ref 4.0–10.5)
nRBC: 0 % (ref 0.0–0.2)

## 2022-05-17 LAB — BASIC METABOLIC PANEL
Anion gap: 8 (ref 5–15)
BUN: 30 mg/dL — ABNORMAL HIGH (ref 8–23)
CO2: 20 mmol/L — ABNORMAL LOW (ref 22–32)
Calcium: 8.2 mg/dL — ABNORMAL LOW (ref 8.9–10.3)
Chloride: 113 mmol/L — ABNORMAL HIGH (ref 98–111)
Creatinine, Ser: 1.59 mg/dL — ABNORMAL HIGH (ref 0.61–1.24)
GFR, Estimated: 43 mL/min — ABNORMAL LOW (ref >60)
Glucose, Bld: 115 mg/dL — ABNORMAL HIGH (ref 70–99)
Potassium: 3.9 mmol/L (ref 3.5–5.1)
Sodium: 141 mmol/L (ref 135–145)

## 2022-05-17 LAB — URINE CULTURE: Culture: NO GROWTH

## 2022-05-17 MED ORDER — PREDNISONE 20 MG PO TABS: 20.0000 mg | ORAL_TABLET | Freq: Every day | ORAL | 0 refills | Status: AC

## 2022-05-17 MED ORDER — DIPHENHYDRAMINE HCL 50 MG PO TABS: 25.0000 mg | ORAL_TABLET | Freq: Three times a day (TID) | ORAL | 0 refills | Status: AC | PRN

## 2022-05-17 MED ORDER — DOXYCYCLINE HYCLATE 100 MG PO TABS: 100.0000 mg | ORAL_TABLET | Freq: Two times a day (BID) | ORAL | 0 refills | Status: AC

## 2022-05-17 NOTE — Care Management CC44 (Signed)
Condition Code 44 Documentation Completed  Patient Details  Name: Kenneth Ross MRN: [id number] Date of Birth: [birth year]   Condition Code 44 given:  Yes Patient signature on Condition Code 44 notice:  Yes Documentation of 2 MD's agreement:  Yes Code 44 added to claim:  Yes    Izola Price, RN 05/17/2022, 11:23 AM

## 2022-05-17 NOTE — Discharge Summary (Signed)
Physician Discharge Summary   Patient: Kenneth Ross MRN: [id number] DOB: 1936-01-16  Admit date:     05/16/2022  Discharge date: 05/17/22  Discharge Physician: Lorella Nimrod   PCP: Tonia Ghent, MD   Recommendations at discharge:  Lyme disease serology RMSF IgG and IgM results are pending-please follow-up Follow-up with primary care provider within a week It was thought to be due to drug hypersensitivity, his recently started [place] for H. pylori treatment was discontinued and he need to follow-up closely with his primary care provider or gastroenterologist for management of his H. pylori infection with a different medicine.  Discharge Diagnoses: Principal Problem:   SIRS (systemic inflammatory response syndrome) (HCC) Active Problems:   Rash   Acute renal failure superimposed on stage 3a chronic kidney disease (HCC)   Tachycardia   Hypercholesteremia   Sleep apnea   H. pylori infection  Hospital Course: Taken from H&P.   DUWAYNE Ross is a 86 y.o. male with medical history significant of hyperlipidemia, GERD, right solitary kidney (s/p of left nephrectomy due to renal cancer), OSA on CPAP, prostate cancer, CKD-3A, Bell's palsy, tachycardia, H. pylori infection, who presents with rash and fever   Patient states that she started treatment for H. pylori with Talicia (omeprazole, amoxicillin, rifabutin) 9 days ago. She developed diffused erythematous rashes in whole body 2 days ago, involving soles, but not involving palms. No mucosa ulcer.  No blisters or bullae. He has erythematous petechial rashes on extremities which are nonblanching and confluent erythematous rashes on trunk which are blanching.  Denies recent hiking. Associated with generalized weakness.  Patient also has chills and fever.  His temperature is 102.1 in ED today. Patient has chronic intermittent mild dry cough, which has not changed.  Denies chest pain or shortness breath.  No nausea vomiting, diarrhea  or abdominal pain.  No symptoms of UTI.   Data Reviewed and ED Course: pt was found to have WBC 6.1, neutrophils 78%, lymphocytes 7%, eosinophilia 2% (0.1), lactic acid 2.6, INR 1.1, PTT 40, negative COVID PCR, worsening renal function, blood pressure 111/63, heart rate 111, RR 24, oxygen saturation 94% on room air.  Chest x-ray showed left diaphragm elevation and bandlike opacity in left base.   Consulted Dr. Linus Salmons of ID by phone, who advised to continue doxycycline.  Less likely to be a tickborne disease.  Most likely drug-induced hypersensitivity.  Lyme serologies and RMSF IgG and IgM was sent-pending   EKG: I have personally reviewed.  Sinus rhythm, QTc 426, LAE, nonspecific T wave change.  Rash pictures in H&P.  6/17: Hemoglobin decreased to 10.8 today, no obvious bleeding, slight decrease in bicarb to 20, creatinine improved to baseline, at 1.59 today.  Procalcitonin at 0.29, urine and blood cultures remain negative so far.  Lactic acidosis has been resolved.  Lyme disease serologies and RMSF IgG and IgM are still pending.  Patient seems much improved with rash seems started healing. Patient wants to go home. He is being discharged on 5 days of doxycycline and prednisone.  He can also use Benadryl as needed.  His recently started medicine called Talicia for H. pylori was discontinued and need to see his gastroenterologist or primary care provider to have his H. pylori treated with a different medication once his rash completely resolves.  Patient will continue with the rest of his medications and need to have a close follow-up with his providers.  Assessment and Plan: * SIRS (systemic inflammatory response syndrome) (HCC) Patient meets SIRS criteria  for SIRS with fever of 102.1, heart rate 111 and RR 24.  Lactic acid is elevated 2.6.  No clear source of infection identified.  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  Other differential diagnosis include Lyme's disease and [facility] spotted fever given rash. I consulted Dr. Linus Salmons of ID by phone. Per Dr. Linus Salmons, this is less likely to be tick-born disease, but he agreed to continue doxycycline.  Patient received 1 dose of cefepime and vancomycin in ED, will not continue Vanco and cefepime.  -Admitted to telemetry bed as inpatient -Follow-up of blood culture, urine culture -will get Procalcitonin and trend lactic acid levels -IVF: 1.5L of NS bolus in ED, followed by 75 cc/h  -will check Lyme PCR and RMSF IgG and IgM. -Doxycycline 100 mg twice daily    Rash -see above -Solu-Medrol 40 mg daily -Oral Pepcid 20 mg twice daily -Bilateral 12.5 mg twice daily  Acute renal failure superimposed on stage 3a chronic kidney disease (HCC) Recent baseline creatinine 1.67.  His creatinine is 2.16, BUN 33.  Likely due to dehydration -IV fluid as above -Avoid using renal toxic medications  Tachycardia Patient has sinus tachycardia, which is chronic issue. -Continue metoprolol prn  Hypercholesteremia - Zocor  Sleep apnea -CPAP  H. pylori infection -will hold Talicia now    Consultants: None Procedures performed: None Disposition: Home Diet recommendation:  Discharge Diet Orders (From admission, onward)     Start     Ordered   05/17/22 0000  Diet - low sodium heart healthy        05/17/22 1051           Cardiac diet DISCHARGE MEDICATION: Allergies as of 05/17/2022       Reactions   Ibuprofen    Caution re: kidney function   Iodinated Contrast Media Nausea And Vomiting   IV dye        Medication List     STOP taking these medications    fluticasone 50 MCG/ACT nasal spray Commonly known as: FLONASE   Talicia 338-25.0-53 MG Cpdr Generic drug: Amoxicill-Rifabutin-Omeprazole       TAKE these medications    aspirin 81 MG tablet Take 81 mg by mouth daily.   Azelastine HCl 0.15 % Soln Place 1 spray into the nose 2 (two) times daily as needed (Allergies).   Calcium 500-125 MG-UNIT  Tabs Take 1 tablet by mouth daily.   calcium carbonate 500 MG chewable tablet Commonly known as: TUMS - dosed in mg elemental calcium Chew 1 tablet by mouth daily as needed for indigestion or heartburn.   diphenhydrAMINE 50 MG tablet Commonly known as: BENADRYL Take 0.5 tablets (25 mg total) by mouth every 8 (eight) hours as needed for itching or allergies.   doxycycline 100 MG tablet Commonly known as: VIBRA-TABS Take 1 tablet (100 mg total) by mouth every 12 (twelve) hours for 5 days.   fish oil-omega-3 fatty acids 1000 MG capsule Take 1 g by mouth daily.   loratadine 10 MG tablet Commonly known as: CLARITIN Take 10 mg by mouth daily.   metoprolol tartrate 25 MG tablet Commonly known as: LOPRESSOR Take 0.5-1 tablets (12.5-25 mg total) by mouth 2 (two) times daily as needed.   multivitamin capsule Take 1 capsule by mouth daily.   omeprazole 20 MG capsule Commonly known as: PRILOSEC Take 1 capsule (20 mg total) by mouth daily.   predniSONE 20 MG tablet Commonly known as: DELTASONE Take 1 tablet (20 mg total) by mouth daily with breakfast  for 5 days.   sildenafil 20 MG tablet Commonly known as: REVATIO TAKE 3 TO 5 TABLETS BY MOUTH ONCE DAILY AS NEEDED   simvastatin 40 MG tablet Commonly known as: ZOCOR Take 1 tablet (40 mg total) by mouth every evening.   Vitamin D 1000 units capsule Take 1,000 Units by mouth daily.        Follow-up Information     Tonia Ghent, MD. Schedule an appointment as soon as possible for a visit in 1 week(s).   Specialty: Family Medicine Contact information: [place] [id number] [phone]                Discharge Exam: Danley Danker Weights   05/16/22 0804  Weight: 63.5 kg   General.     In no acute distress. Pulmonary.  Lungs clear bilaterally, normal respiratory effort. CV.  Regular rate and rhythm, no JVD, rub or murmur. Abdomen.  Soft, nontender, nondistended, BS positive. CNS.  Alert and  oriented .  No focal neurologic deficit. Extremities.  No edema, no cyanosis, pulses intact and symmetrical. Psychiatry.  Judgment and insight appears normal.   Condition at discharge: stable  The results of significant diagnostics from this hospitalization (including imaging, microbiology, ancillary and laboratory) are listed below for reference.   Imaging Studies: DG Chest Port 1 View  Result Date: 05/16/2022 CLINICAL DATA:  Provided history: Questionable sepsis-evaluate for abnormality. Rash. EXAM: PORTABLE CHEST 1 VIEW COMPARISON:  Prior chest radiographs 09/22/2019 and earlier. FINDINGS: Heart size within normal limits. Chronic elevation of the left hemidiaphragm. Bandlike opacities within the left lung base, likely reflecting atelectasis. No appreciable airspace consolidation. No evidence of pleural effusion or pneumothorax. No acute bony abnormality identified. Soft tissue anchors within the proximal humeri. Upper thoracic dextrocurvature. Surgical clips within the upper abdomen. IMPRESSION: No appreciable airspace consolidation. Chronic elevation of the left hemidiaphragm. Bandlike opacities within the left lung base, likely reflecting atelectasis. Electronically Signed   By: Kellie Simmering D.O.   On: 05/16/2022 08:51    Microbiology: Results for orders placed or performed during the hospital encounter of 05/16/22  Resp Panel by RT-PCR (Flu A&B, Covid) Anterior Nasal Swab     Status: None   Collection Time: 05/16/22  8:46 AM   Specimen: Anterior Nasal Swab  Result Value Ref Range Status   SARS Coronavirus 2 by RT PCR NEGATIVE NEGATIVE Final    Comment: (NOTE) SARS-CoV-2 target nucleic acids are NOT DETECTED.  The SARS-CoV-2 RNA is generally detectable in upper respiratory specimens during the acute phase of infection. The lowest concentration of SARS-CoV-2 viral copies this assay can detect is 138 copies/mL. A negative result does not preclude SARS-Cov-2 infection and should not be  used as the sole basis for treatment or other patient management decisions. A negative result may occur with  improper specimen collection/handling, submission of specimen other than nasopharyngeal swab, presence of viral mutation(s) within the areas targeted by this assay, and inadequate number of viral copies(<138 copies/mL). A negative result must be combined with clinical observations, patient history, and epidemiological information. The expected result is Negative.  Fact Sheet for Patients:  EntrepreneurPulse.com.au  Fact Sheet for Healthcare Providers:  IncredibleEmployment.be  This test is no t yet approved or cleared by the [place] FDA and  has been authorized for detection and/or diagnosis of SARS-CoV-2 by FDA under an Emergency Use Authorization (EUA). This EUA will remain  in effect (meaning this test can be used) for the duration of the  COVID-19 declaration under Section 564(b)(1) of the Act, 21 U.S.C.section 360bbb-3(b)(1), unless the authorization is terminated  or revoked sooner.       Influenza A by PCR NEGATIVE NEGATIVE Final   Influenza B by PCR NEGATIVE NEGATIVE Final    Comment: (NOTE) The Xpert Xpress SARS-CoV-2/FLU/RSV plus assay is intended as an aid in the diagnosis of influenza from Nasopharyngeal swab specimens and should not be used as a sole basis for treatment. Nasal washings and aspirates are unacceptable for Xpert Xpress SARS-CoV-2/FLU/RSV testing.  Fact Sheet for Patients: EntrepreneurPulse.com.au  Fact Sheet for Healthcare Providers: IncredibleEmployment.be  This test is not yet approved or cleared by the [place] FDA and has been authorized for detection and/or diagnosis of SARS-CoV-2 by FDA under an Emergency Use Authorization (EUA). This EUA will remain in effect (meaning this test can be used) for the duration of the COVID-19 declaration under Section  564(b)(1) of the Act, 21 U.S.C. section 360bbb-3(b)(1), unless the authorization is terminated or revoked.  Performed at [facility], [address] [place], [place] [address]   Blood Culture (routine x 2)     Status: None (Preliminary result)   Collection Time: 05/16/22  8:46 AM   Specimen: BLOOD  Result Value Ref Range Status   Specimen Description BLOOD BLOOD RIGHT FOREARM  Final   Special Requests   Final    BOTTLES DRAWN AEROBIC AND ANAEROBIC Blood Culture adequate volume   Culture   Final    NO GROWTH < 24 HOURS Performed at [facility], [address], [place], [place] [address]    Report Status PENDING  Incomplete  Blood Culture (routine x 2)     Status: None (Preliminary result)   Collection Time: 05/16/22  8:46 AM   Specimen: BLOOD  Result Value Ref Range Status   Specimen Description BLOOD BLOOD LEFT FOREARM  Final   Special Requests   Final    BOTTLES DRAWN AEROBIC AND ANAEROBIC Blood Culture results may not be optimal due to an excessive volume of blood received in culture bottles   Culture   Final    NO GROWTH < 24 HOURS Performed at [facility], [address] [place], [place] [address]    Report Status PENDING  Incomplete  Urine Culture     Status: None   Collection Time: 05/16/22  8:46 AM   Specimen: In/Out Cath Urine  Result Value Ref Range Status   Specimen Description   Final    IN/OUT CATH URINE Performed at [facility], [address] [place], [place] [address]    Special Requests   Final    NONE Performed at [facility], [address], [place], [place] [address]    Culture   Final    NO GROWTH Performed at [facility], [place] [address], [place], [place] [address]    Report Status 05/17/2022 FINAL  Final    Labs: CBC: Recent Labs  Lab 05/16/22 0814 05/17/22 0344  WBC 6.1 6.5  NEUTROABS 4.8  --   HGB 12.1* 10.8*  HCT 37.3* 33.1*  MCV 95.9 96.2  PLT 178 366   Basic  Metabolic Panel: Recent Labs  Lab 05/16/22 0814 05/17/22 0344  NA 136 141  K 4.3 3.9  CL 106 113*  CO2 21* 20*  GLUCOSE 146* 115*  BUN 33* 30*  CREATININE 2.16* 1.59*  CALCIUM 8.5* 8.2*   Liver Function Tests: Recent Labs  Lab 05/16/22 0814  AST 32  ALT 39  ALKPHOS 58  BILITOT 0.6  PROT 6.6  ALBUMIN 3.8   CBG: No results for input(s): "GLUCAP" in the last 168 hours.  Discharge time spent: greater than 30 minutes.  This record has been created using Systems analyst. Errors have been sought and corrected,but may not always be located. Such creation errors do not reflect on the standard of care.   Signed: Lorella Nimrod, MD [facility] 05/17/2022

## 2022-05-17 NOTE — Hospital Course (Addendum)
Taken from H&P.   Kenneth Ross is a 86 y.o. male with medical history significant of hyperlipidemia, GERD, right solitary kidney (s/p of left nephrectomy due to renal cancer), OSA on CPAP, prostate cancer, CKD-3A, Bell's palsy, tachycardia, H. pylori infection, who presents with rash and fever   Patient states that she started treatment for H. pylori with Talicia (omeprazole, amoxicillin, rifabutin) 9 days ago. She developed diffused erythematous rashes in whole body 2 days ago, involving soles, but not involving palms. No mucosa ulcer.  No blisters or bullae. He has erythematous petechial rashes on extremities which are nonblanching and confluent erythematous rashes on trunk which are blanching.  Denies recent hiking. Associated with generalized weakness.  Patient also has chills and fever.  His temperature is 102.1 in ED today. Patient has chronic intermittent mild dry cough, which has not changed.  Denies chest pain or shortness breath.  No nausea vomiting, diarrhea or abdominal pain.  No symptoms of UTI.   Data Reviewed and ED Course: pt was found to have WBC 6.1, neutrophils 78%, lymphocytes 7%, eosinophilia 2% (0.1), lactic acid 2.6, INR 1.1, PTT 40, negative COVID PCR, worsening renal function, blood pressure 111/63, heart rate 111, RR 24, oxygen saturation 94% on room air.  Chest x-ray showed left diaphragm elevation and bandlike opacity in left base.   Consulted Dr. Linus Salmons of ID by phone, who advised to continue doxycycline.  Less likely to be a tickborne disease.  Most likely drug-induced hypersensitivity.  Lyme serologies and RMSF IgG and IgM was sent-pending   EKG: I have personally reviewed.  Sinus rhythm, QTc 426, LAE, nonspecific T wave change.  Rash pictures in H&P.  6/17: Hemoglobin decreased to 10.8 today, no obvious bleeding, slight decrease in bicarb to 20, creatinine improved to baseline, at 1.59 today.  Procalcitonin at 0.29, urine and blood cultures remain negative so far.   Lactic acidosis has been resolved.  Lyme disease serologies and RMSF IgG and IgM are still pending.  Patient seems much improved with rash seems started healing. Patient wants to go home. He is being discharged on 5 days of doxycycline and prednisone.  He can also use Benadryl as needed.  His recently started medicine called Talicia for H. pylori was discontinued and need to see his gastroenterologist or primary care provider to have his H. pylori treated with a different medication once his rash completely resolves.  Patient will continue with the rest of his medications and need to have a close follow-up with his providers.

## 2022-05-17 NOTE — Plan of Care (Signed)
  Problem: Safety: Goal: Ability to remain free from injury will improve Outcome: Progressing   

## 2022-05-17 NOTE — Telephone Encounter (Signed)
Please check with patient and change his appointment with Dr. Lorelei Pont to a hospital follow up with me.  He likely needs to reschedule his appointment with Dr. Edilia Bo for later but we can address that in the future.

## 2022-05-18 LAB — ROCKY MTN SPOTTED FVR ABS PNL(IGG+IGM)
RMSF IgG: NEGATIVE
RMSF IgM: 0.34 index (ref 0.00–0.89)

## 2022-05-19 ENCOUNTER — Telehealth: Payer: Self-pay

## 2022-05-19 ENCOUNTER — Ambulatory Visit: Payer: PPO | Admitting: Family Medicine

## 2022-05-19 NOTE — Telephone Encounter (Signed)
-----   Message from Tonia Ghent, MD sent at 05/19/2022  8:06 AM EDT ----- Regarding: FW: Please see about getting him rescheduled with me instead of Dr. Lorelei Pont  ----- Message ----- From: Tonia Ghent, MD Sent: 05/19/2022   7:50 AM EDT To: Tonia Ghent, MD; Filbert Berthold, CMA Subject: Please see about getting him rescheduled wit#

## 2022-05-19 NOTE — Telephone Encounter (Signed)
Pt already scheduled with Damita Dunnings for Friday.

## 2022-05-19 NOTE — Telephone Encounter (Signed)
Called patient appointment with Dr.Copland cancelled and made follow up with Dr. Damita Dunnings for Friday.

## 2022-05-21 LAB — CULTURE, BLOOD (ROUTINE X 2)
Culture: NO GROWTH
Culture: NO GROWTH
Special Requests: ADEQUATE

## 2022-05-22 LAB — LYME DISEASE DNA BY PCR(BORRELIA BURG): Lyme Disease(B.burgdorferi)PCR: NEGATIVE

## 2022-05-23 ENCOUNTER — Ambulatory Visit (INDEPENDENT_AMBULATORY_CARE_PROVIDER_SITE_OTHER): Payer: PPO | Admitting: Family Medicine

## 2022-05-23 ENCOUNTER — Encounter: Payer: Self-pay | Admitting: Family Medicine

## 2022-05-23 VITALS — BP 108/54 | HR 91 | Temp 97.9°F | Ht 64.0 in | Wt 142.0 lb

## 2022-05-23 DIAGNOSIS — R21 Rash and other nonspecific skin eruption: Secondary | ICD-10-CM | POA: Diagnosis not present

## 2022-05-23 LAB — BASIC METABOLIC PANEL
BUN: 30 mg/dL — ABNORMAL HIGH (ref 6–23)
CO2: 30 mEq/L (ref 19–32)
Calcium: 9.7 mg/dL (ref 8.4–10.5)
Chloride: 103 mEq/L (ref 96–112)
Creatinine, Ser: 1.57 mg/dL — ABNORMAL HIGH (ref 0.40–1.50)
GFR: 40.51 mL/min — ABNORMAL LOW (ref >60.00)
Glucose, Bld: 95 mg/dL (ref 70–99)
Potassium: 3.9 mEq/L (ref 3.5–5.1)
Sodium: 141 mEq/L (ref 135–145)

## 2022-05-23 LAB — CBC WITH DIFFERENTIAL/PLATELET
Basophils Absolute: 0.1 10*3/uL (ref 0.0–0.1)
Basophils Relative: 0.5 % (ref 0.0–3.0)
Eosinophils Absolute: 0.3 10*3/uL (ref 0.0–0.7)
Eosinophils Relative: 2.6 % (ref 0.0–5.0)
HCT: 37.2 % — ABNORMAL LOW (ref 39.0–52.0)
Hemoglobin: 12.4 g/dL — ABNORMAL LOW (ref 13.0–17.0)
Lymphocytes Relative: 24.1 % (ref 12.0–46.0)
Lymphs Abs: 2.6 10*3/uL (ref 0.7–4.0)
MCHC: 33.3 g/dL (ref 30.0–36.0)
MCV: 96.5 fl (ref 78.0–100.0)
Monocytes Absolute: 1.3 10*3/uL — ABNORMAL HIGH (ref 0.1–1.0)
Monocytes Relative: 12.3 % — ABNORMAL HIGH (ref 3.0–12.0)
Neutro Abs: 6.6 10*3/uL (ref 1.4–7.7)
Neutrophils Relative %: 60.5 % (ref 43.0–77.0)
Platelets: 356 10*3/uL (ref 150.0–400.0)
RBC: 3.85 Mil/uL — ABNORMAL LOW (ref 4.22–5.81)
RDW: 14.2 % (ref 11.5–15.5)
WBC: 10.9 10*3/uL — ABNORMAL HIGH (ref 4.0–10.5)

## 2022-05-23 NOTE — Progress Notes (Signed)
He had sx after last OV.  He is finishing prednisone and doxy today.  Insomnia noted on prednisone.  He took one dose of omeprazole in the meantime w/o sx.  No h/o PCN allergy.    Inpatient course d/w pt. he was feeling well taking Tilicia for H. pylori infection when I saw him at the last office visit.  Subsequently he developed a rash that involves the palms but not the soles and had a fever.  He did not have mucous membrane involvement.  He went to the hospital and was admitted for SIRS.  Discussed with patient about SIRS and DRESS.  He was treated preemptively with doxycycline and prednisone.  He is clearly better in the meantime.  He is not taking Tilicia in the meantime.  Rashes resolved.  =================================== Recommendations at discharge:  Lyme disease serology RMSF IgG and IgM results are pending-please follow-up Follow-up with primary care provider within a week It was thought to be due to drug hypersensitivity, his recently started Tilicia for H. pylori treatment was discontinued and he need to follow-up closely with his primary care provider or gastroenterologist for management of his H. pylori infection with a different medicine.   Discharge Diagnoses: Principal Problem:   SIRS (systemic inflammatory response syndrome) (HCC) Active Problems:   Rash   Acute renal failure superimposed on stage 3a chronic kidney disease (HCC)   Tachycardia   Hypercholesteremia   Sleep apnea   H. pylori infection   Hospital Course: Taken from H&P.   HERU GUNZENHAUSER is a 86 y.o. male with medical history significant of hyperlipidemia, GERD, right solitary kidney (s/p of left nephrectomy due to renal cancer), OSA on CPAP, prostate cancer, CKD-3A, Bell's palsy, tachycardia, H. pylori infection, who presents with rash and fever   Patient states that she started treatment for H. pylori with Talicia (omeprazole, amoxicillin, rifabutin) 9 days ago. She developed diffused erythematous  rashes in whole body 2 days ago, involving soles, but not involving palms. No mucosa ulcer.  No blisters or bullae. He has erythematous petechial rashes on extremities which are nonblanching and confluent erythematous rashes on trunk which are blanching.  Denies recent hiking. Associated with generalized weakness.  Patient also has chills and fever.  His temperature is 102.1 in ED today. Patient has chronic intermittent mild dry cough, which has not changed.  Denies chest pain or shortness breath.  No nausea vomiting, diarrhea or abdominal pain.  No symptoms of UTI.   Data Reviewed and ED Course: pt was found to have WBC 6.1, neutrophils 78%, lymphocytes 7%, eosinophilia 2% (0.1), lactic acid 2.6, INR 1.1, PTT 40, negative COVID PCR, worsening renal function, blood pressure 111/63, heart rate 111, RR 24, oxygen saturation 94% on room air.  Chest x-ray showed left diaphragm elevation and bandlike opacity in left base.   Consulted Dr. Luciana Axe of ID by phone, who advised to continue doxycycline.  Less likely to be a tickborne disease.  Most likely drug-induced hypersensitivity.  Lyme serologies and RMSF IgG and IgM was sent-pending   EKG: I have personally reviewed.  Sinus rhythm, QTc 426, LAE, nonspecific T wave change.   Rash pictures in H&P.   6/17: Hemoglobin decreased to 10.8 today, no obvious bleeding, slight decrease in bicarb to 20, creatinine improved to baseline, at 1.59 today.  Procalcitonin at 0.29, urine and blood cultures remain negative so far.  Lactic acidosis has been resolved.  Lyme disease serologies and RMSF IgG and IgM are still pending.  Patient seems  much improved with rash seems started healing. Patient wants to go home. He is being discharged on 5 days of doxycycline and prednisone.  He can also use Benadryl as needed.  His recently started medicine called Talicia for H. pylori was discontinued and need to see his gastroenterologist or primary care provider to have his H. pylori  treated with a different medication once his rash completely resolves.   Patient will continue with the rest of his medications and need to have a close follow-up with his providers.   Assessment and Plan: * SIRS (systemic inflammatory response syndrome) (HCC) Patient meets SIRS criteria for SIRS with fever of 102.1, heart rate 111 and RR 24.  Lactic acid is elevated 2.6.  No clear source of infection identified.  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  Other differential diagnosis include Lyme's disease and [facility] spotted fever given rash. I consulted Dr. Luciana Axe of ID by phone. Per Dr. Luciana Axe, this is less likely to be tick-born disease, but he agreed to continue doxycycline.  Patient received 1 dose of cefepime and vancomycin in ED, will not continue Vanco and cefepime.   -Admitted to telemetry bed as inpatient -Follow-up of blood culture, urine culture -will get Procalcitonin and trend lactic acid levels -IVF: 1.5L of NS bolus in ED, followed by 75 cc/h  -will check Lyme PCR and RMSF IgG and IgM. -Doxycycline 100 mg twice daily       Rash -see above -Solu-Medrol 40 mg daily -Oral Pepcid 20 mg twice daily -Bilateral 12.5 mg twice daily   Acute renal failure superimposed on stage 3a chronic kidney disease (HCC) Recent baseline creatinine 1.67.  His creatinine is 2.16, BUN 33.  Likely due to dehydration -IV fluid as above -Avoid using renal toxic medications   Tachycardia Patient has sinus tachycardia, which is chronic issue. -Continue metoprolol prn   Hypercholesteremia - Zocor   Sleep apnea -CPAP   H. pylori infection -will hold Talicia now   =================================== Meds, vitals, and allergies reviewed.   ROS: Per HPI unless specifically indicated in ROS section   GEN: nad, alert and oriented HEENT: ncat, OP wnl w/o lesions.  NECK: supple w/o LA CV: rrr. PULM: ctab, no inc wob ABD: soft, +bs EXT: no edema SKIN: no acute rash

## 2022-05-25 ENCOUNTER — Telehealth: Payer: Self-pay | Admitting: Family Medicine

## 2022-05-25 NOTE — Telephone Encounter (Signed)
Please see my office visit note.  The patient had a fever and rash and was admitted to the hospital with a likely drug reaction, and I presume the issue to be rifabutin.  He does not have a penicillin allergy otherwise and he has tolerated a dose of omeprazole in the meantime.  I need your input about the possibility of a reaction due to ributinin and options going forward about treatment for H. pylori after he has had a period of time out of the hospital.  Many thanks.

## 2022-05-26 ENCOUNTER — Other Ambulatory Visit: Payer: Self-pay

## 2022-05-26 MED ORDER — OMEPRAZOLE 20 MG PO CPDR: 20.0000 mg | DELAYED_RELEASE_CAPSULE | Freq: Two times a day (BID) | ORAL | 0 refills | Status: DC

## 2022-05-26 MED ORDER — METRONIDAZOLE 250 MG PO TABS: 250.0000 mg | ORAL_TABLET | Freq: Four times a day (QID) | ORAL | 0 refills | Status: AC

## 2022-05-26 MED ORDER — DOXYCYCLINE HYCLATE 100 MG PO CAPS: 100.0000 mg | ORAL_CAPSULE | Freq: Two times a day (BID) | ORAL | 0 refills | Status: AC

## 2022-05-26 MED ORDER — BISMUTH SUBSALICYLATE 262 MG PO TABS: 262.0000 mg | ORAL_TABLET | Freq: Four times a day (QID) | ORAL | 0 refills | Status: AC

## 2022-06-13 DIAGNOSIS — G4733 Obstructive sleep apnea (adult) (pediatric): Secondary | ICD-10-CM | POA: Diagnosis not present

## 2022-06-27 ENCOUNTER — Other Ambulatory Visit: Payer: PPO

## 2022-06-27 DIAGNOSIS — A048 Other specified bacterial intestinal infections: Secondary | ICD-10-CM | POA: Diagnosis not present

## 2022-06-30 LAB — HELICOBACTER PYLORI  SPECIAL ANTIGEN
MICRO NUMBER:: 13708442
SPECIMEN QUALITY: ADEQUATE

## 2022-07-01 ENCOUNTER — Ambulatory Visit (INDEPENDENT_AMBULATORY_CARE_PROVIDER_SITE_OTHER): Payer: PPO | Admitting: Family Medicine

## 2022-07-01 ENCOUNTER — Encounter: Payer: Self-pay | Admitting: Family Medicine

## 2022-07-01 VITALS — BP 110/60 | HR 106 | Temp 98.0°F | Ht 64.0 in | Wt 139.0 lb

## 2022-07-01 DIAGNOSIS — M549 Dorsalgia, unspecified: Secondary | ICD-10-CM

## 2022-07-01 DIAGNOSIS — R000 Tachycardia, unspecified: Secondary | ICD-10-CM

## 2022-07-01 DIAGNOSIS — A048 Other specified bacterial intestinal infections: Secondary | ICD-10-CM | POA: Diagnosis not present

## 2022-07-01 DIAGNOSIS — M4120 Other idiopathic scoliosis, site unspecified: Secondary | ICD-10-CM

## 2022-07-01 MED ORDER — METOPROLOL TARTRATE 25 MG PO TABS: 12.5000 mg | ORAL_TABLET | Freq: Two times a day (BID) | ORAL | 3 refills | Status: AC | PRN

## 2022-07-01 NOTE — Patient Instructions (Addendum)
I would start taking metoprolol if your heart rate is above 100.  I sent the rx.  Make sure to drink enough water to keep your urine clear or light colored.   We'll call about seeing PT.  See if they can give you options for home exercises.   Let me update the GI clinic.  Take care.  Glad to see you.

## 2022-07-01 NOTE — Progress Notes (Unsigned)
Follow up.  H Pylori testing is neg.  D/w pt at OV.  He was off PPI for 2 weeks prior to stool sample.  No abx currently.    He was able to tolerate  Bismuth subsalicylate 916 mg po qid, 14d Metronidazole 250 mg po qid, 14d Doxycycline 100 mg po bid, 14d Omeprazole 20 mg po bid, 14d   He doesn't have PCN allergy hx and he has been able to tolerate omeprazole.  D/w pt that rifabutin was likely the issue.    No abd pain.  He prev had pain under R lower ribs.  No pain in the last week or two.  His appetite is lower since being on abx.  Nausea is getting better, worse on the abx.  He is better on [place] and off abx.  No black or bloody stools.    Prev u/s with IMPRESSION: Small amount of sludge in the gallbladder. No shadowing stones or evidence of acute cholecystitis. No acute findings.  Sinus tachycardia noted, he was rushing for the appointment and had been working hard this AM.  No recent metoprolol.  No CP.  Not SOB.    We talked about back strengthening exercises.  He is putting up with pain.  We talked about his spinal changes at baseline.    Meds, vitals, and allergies reviewed.   ROS: Per HPI unless specifically indicated in ROS section   Recheck pulse 100.    Need to update GI clinic.

## 2022-07-02 NOTE — Assessment & Plan Note (Signed)
Improved on recheck, regular.  Can use metoprolol as needed.  He will update me as needed.

## 2022-07-02 NOTE — Assessment & Plan Note (Signed)
Refer to PT

## 2022-07-02 NOTE — Assessment & Plan Note (Signed)
Resolved.  Has been able to tolerate omeprazole and penicillin without reaction so I suspect that rifabutin was the issue that caused his previous rash.  Allergy list updated.  His abdominal pain has resolved.  If he has recurrent symptoms he will let me know.  His H. pylori testing is negative and that was collected when he was off PPI.  I will update GI as FYI.

## 2022-07-15 DIAGNOSIS — C61 Malignant neoplasm of prostate: Secondary | ICD-10-CM | POA: Diagnosis not present

## 2022-07-15 DIAGNOSIS — G4733 Obstructive sleep apnea (adult) (pediatric): Secondary | ICD-10-CM | POA: Diagnosis not present

## 2022-07-20 NOTE — Progress Notes (Unsigned)
    Kenneth Brauner T. Kaelob Persky, MD, Kenneth Ross at [facility] [place], [address]  Phone: [phone]  FAX: [place] - 86 y.o. male  MRN [id number]  Date of Birth: 1936-06-09  Date: 07/21/2022  PCP: Tonia Ghent, MD  Referral: Tonia Ghent, MD  No chief complaint on file.  Subjective:   Kenneth Ross is a 86 y.o. very pleasant male patient with There is no height or weight on file to calculate BMI. who presents with the following:  Carpal tunnel syndrome, presents for carpal tunnel syndrome injection.  I did do a left-sided carpal tunnel injection in January 29, 2022.    Review of Systems is noted in the HPI, as appropriate  Objective:   There were no vitals taken for this visit.  GEN: No acute distress; alert,appropriate. PULM: Breathing comfortably in no respiratory distress PSYCH: Normally interactive.   Laboratory and Imaging Data:  Assessment and Plan:   ***

## 2022-07-21 ENCOUNTER — Encounter: Payer: Self-pay | Admitting: Family Medicine

## 2022-07-21 ENCOUNTER — Ambulatory Visit (INDEPENDENT_AMBULATORY_CARE_PROVIDER_SITE_OTHER): Payer: PPO | Admitting: Family Medicine

## 2022-07-21 VITALS — BP 110/62 | HR 96 | Temp 98.6°F | Ht 64.0 in | Wt 141.0 lb

## 2022-07-21 DIAGNOSIS — G5602 Carpal tunnel syndrome, left upper limb: Secondary | ICD-10-CM

## 2022-07-21 MED ORDER — TRIAMCINOLONE ACETONIDE 40 MG/ML IJ SUSP
40.0000 mg | Freq: Once | INTRAMUSCULAR | Status: AC
  Administered 2022-07-21: 20 mg via INTRAMUSCULAR

## 2022-07-22 ENCOUNTER — Ambulatory Visit: Payer: PPO | Attending: Family Medicine | Admitting: Physical Therapy

## 2022-07-22 ENCOUNTER — Other Ambulatory Visit: Payer: Self-pay

## 2022-07-22 ENCOUNTER — Encounter: Payer: Self-pay | Admitting: Physical Therapy

## 2022-07-22 DIAGNOSIS — M549 Dorsalgia, unspecified: Secondary | ICD-10-CM | POA: Insufficient documentation

## 2022-07-22 DIAGNOSIS — M5459 Other low back pain: Secondary | ICD-10-CM | POA: Diagnosis not present

## 2022-07-22 NOTE — Therapy (Unsigned)
OUTPATIENT PHYSICAL THERAPY THORACOLUMBAR EVALUATION   Patient Name: Kenneth Ross MRN: [id number] DOB:February 17, 1936, 86 y.o., male Today's Date: 07/23/2022   PT End of Session - 07/23/22 1334     Visit Number 1    Number of Visits 16    Date for PT Re-Evaluation 09/17/22    Authorization Type HTA    PT Start Time 1015    PT Stop Time 1100    PT Time Calculation (min) 45 min    Activity Tolerance Patient tolerated treatment well    Behavior During Therapy [facility] for tasks assessed/performed             Past Medical History:  Diagnosis Date   Allergic rhinitis, seasonal    Allergy    Bell's palsy    with L eyelid droop at baseline as of 2017   Blood transfusion without reported diagnosis    Cancer of kidney ([place])    Cataract    CKD (chronic kidney disease), stage III ([place])    followed by pcp   ED (erectile dysfunction)    GERD (gastroesophageal reflux disease)    occasional take tums   History of renal cell carcinoma    1986  s/p  left nephrectomy   Hyperlipidemia    Idiopathic scoliosis    Pneumonia    Prostate cancer [facility]) urologist-- dr dahlstedt/  oncologist-- dr [name]   dx 09-07-2019--- Stage T1c,  Gleason 4+3   Seasonal allergies    Sleep apnea    Cpap   Solitary right kidney    s/p  left nephrecotmy 1986   Tachycardia followed by pcp   HR 100 @ pcp office --- pt referred to cardiology for consult w/ dr t. Rockey Situ note in epic 12-14-2017, by pcp (dr Damita Dunnings) no work up done,  pt has lopressor as needed if heart rate is elevated as pt feels appropiate   Past Surgical History:  Procedure Laterality Date   CATARACT EXTRACTION Left 06/10/2018   Dr. Valetta Close   CATARACT EXTRACTION Right 2021   CATARACT EXTRACTION W/ INTRAOCULAR LENS IMPLANT Left    LAMINECTOMY  1993   L3-5   NEPHRECTOMY Left 10/1985   RADIOACTIVE SEED IMPLANT N/A 10/21/2019   Procedure: RADIOACTIVE SEED IMPLANT/BRACHYTHERAPY IMPLANT;  Surgeon: Franchot Gallo, MD;  Location: [facility];  Service: Urology;  Laterality: N/A;  65 seeds implanted   SHOULDER OPEN ROTATOR CUFF REPAIR  12/1998   Right   SHOULDER OPEN ROTATOR CUFF REPAIR Bilateral 08/2001   SPACE OAR INSTILLATION N/A 10/21/2019   Procedure: SPACE OAR INSTILLATION;  Surgeon: Franchot Gallo, MD;  Location: [facility];  Service: Urology;  Laterality: N/A;   Patient Active Problem List   Diagnosis Date Noted   Sleep apnea    Acute renal failure superimposed on stage 3a chronic kidney disease (HCC)    Rash    H. pylori infection    Carpal tunnel syndrome 09/11/2021   Throat clearing 09/11/2021   Actinic keratosis 09/11/2021   Snoring 01/08/2020   Prostate cancer ([place]) 09/13/2019   Health care maintenance 08/31/2018   Tachycardia 10/27/2017   Idiopathic scoliosis 08/08/2015   Advance care planning 08/03/2014   Medicare annual wellness visit, subsequent 07/26/2012   Hypercholesteremia 07/24/2011   Renal insufficiency 07/24/2011   ERECTILE DYSFUNCTION, ORGANIC 05/21/2010   FOOT PAIN, RIGHT 02/18/2008   Renal cell cancer, left ([place]) 07/22/2007    PCP: Dr. Elsie Stain   REFERRING PROVIDER: Dr. Elsie Stain  REFERRING DIAG:  M54.9 (ICD-10-CM) - Back pain, unspecified back location, unspecified back pain laterality, unspecified chronicity   Rationale for Evaluation and Treatment Rehabilitation  THERAPY DIAG:  Other low back pain  ONSET DATE: 07/22/21  SUBJECTIVE:                                                                                                                                                                                           SUBJECTIVE STATEMENT: See pertinent history  PERTINENT HISTORY:  Patient reports that he has right levo-scoliosis for most of his life. For the past year, he has been experiencing worsening low back pain. He mostly experiences this on his right side and it increases when standing up. He has h/o of low back surgery where he  had discetomy in 1993 which he reports helping significantly. For physical activity, he walks dogs 3x per day, mows lawn, and vacuums.   PAIN:  Are you having pain? Yes: NPRS scale: 7-8/10 Pain location: Right side of low back  Pain description: Dull and sharp  Aggravating factors: Standing for long periods of time  Relieving factors: Sitting still    PRECAUTIONS: None  WEIGHT BEARING RESTRICTIONS No  FALLS:  Has patient fallen in last 6 months? No  LIVING ENVIRONMENT: Lives with: lives with their spouse  Lives in: House/apartment and he has his own home  Stairs: No for shared house, for his house he has 3 steps with no railing  Has following equipment at home: None  OCCUPATION: Retired   PLOF: [place] He hopes to regain strength in his low back and relieve his low back pain    OBJECTIVE:               VITALS: BP 104/63 HR 82 SpO2 100  DIAGNOSTIC FINDINGS:  Clinical Data:  Low back pain.  Radiculopathy.  Right hip and lower extremity pain and numbness.  Status post diskectomy in 1993.  History of renal cell carcinoma with left nephrectomy in 1986.  MRI LUMBAR SPINE WITHOUT AND WITH CONTRAST:  Technique:  Multiplanar and multiecho pulse sequences of the lumbar spine, to include the lower thoracic region and upper sacral regions, were obtained according to standard protocol before and after administration of intravenous contrast.  Contrast:  13 mL Omniscan.  Comparison:  None.  Findings:  As stated, the patient is status post left nephrectomy.  Soft tissues are otherwise unremarkable.  Normal signal is present in the conus medullaris which terminates at L1-2.  Vertebral body height and alignment are maintained.  There are endplate marrow changes at L2-3 and L5-S1.  Marrow signal is otherwise within normal  limits.    L1-2:  Normal.  L2-3:  There is near complete loss of disc height with some disc bulging and significant osteophyte formation.  This results in  moderate left neural foraminal narrowing.  The central canal and right neural foramen are patent.  Endplate changes are seen with sclerosis and mild enhancement.  L3-4:  A broad-based disc bulge and facet hypertrophy are present.  The disc bulge contacts the right L4 nerve roots within the lateral recess.  L3 nerve roots appear to exit without contact.  The right L3 nerve root is displaced posteriorly by the disc bulge, although not contacted.  L4-5:  A broad-based disc bulge, facet hypertrophy, and ligamentum flavum thickening are present.  This results in mild central canal stenosis with impact on the lateral recess bilaterally, right greater than left.  The disc bulge also contacts the right L4 nerve root with moderate right neural foraminal narrowing.  The left neural foramen is patent.  L5-S1:  The patient is status post laminectomy at this level.  There is grade I anterolisthesis, greater on the right than left.  Some subluxation of the right facet joints is noted.  Moderate right neural foraminal narrowing secondary to this in conjunction with osteophyte formation of the S1 vertebral body.  There may be a pars defect on the right.  No definite pars defect is seen on the left.  Sclerotic endplate changes are noted.  IMPRESSION:  1.  Moderate left neural foraminal narrowing L2-3.  2.  Moderate right neural foraminal narrowing L4-5 and L5-S1.  3.  Mild central canal stenosis with impact on the lateral recess at L4-5.  4.  Anterolisthesis greater on the right at L5-S1.   Provider: Donnella Bi, Wilford Grist  PATIENT SURVEYS:  FOTO 58/100 with target of  68  SCREENING FOR RED FLAGS: Bowel or bladder incontinence: No Spinal tumors: No Cauda equina syndrome: No Compression fracture: No Abdominal aneurysm: No  COGNITION:  Overall cognitive status: Within functional limits for tasks assessed     SENSATION: Light touch: N/t on lateral side of right leg that radiates down from low back  to ankle with some weakness in his right knee.   MUSCLE LENGTH: Hamstrings: Right 80 deg; Left 80 deg Thomas test: + bilateral  POSTURE: right pelvic obliquity  PALPATION: Right glute   LUMBAR ROM:   Active  A/PROM  eval  Flexion 100%  Extension 100%  Right lateral flexion 100%  Left lateral flexion 100%  Right rotation 100%*  Left rotation 100%   (Blank rows = not tested)  LOWER EXTREMITY ROM:        Active  Right 07/23/2022 Left 07/23/2022  Hip flexion 120 120  Hip extension 30 30  Hip abduction 45 45  Hip adduction 30 30  Hip internal rotation 45 45  Hip external rotation 45 45  Knee flexion 135 135  Knee extension 0 0  Ankle dorsiflexion 20 20  Ankle plantarflexion 50 50  Ankle inversion 35 35  Ankle eversion 15 15   (Blank rows = not tested)     LOWER EXTREMITY MMT:    MMT Right eval Left eval  Hip flexion 5 5  Hip extension 4 4  Hip abduction 4 4  Hip adduction 4- 4-  Hip internal rotation    Hip external rotation    Knee flexion 5 5  Knee extension 5 5  Ankle dorsiflexion 5 5  Ankle plantarflexion    Ankle inversion    Ankle  eversion     (Blank rows = not tested)  LUMBAR SPECIAL TESTS:  Straight leg raise test: Negative, FABER test: Negative, Thomas test: Positive, and Thigh Trust negative bilateral    GAIT: Distance walked: 40 ft  Assistive device utilized: None Level of assistance: Complete Independence Comments: No abnormalities     TODAY'S TREATMENT  Lower Trunk Rotation 3 x 10  Modified Thomas Stretch 3 x 60 sec    PATIENT EDUCATION:  Education details: form and technique and explanation of plan of care   Person educated: Patient Education method: Explanation, Demonstration, Verbal cues, and Handouts Education comprehension: verbalized understanding and returned demonstration   HOME EXERCISE PROGRAM: Access Code: [email or web address] URL: [email or web address] Date: 07/22/2022 Prepared by: Bradly Chris  Exercises - Supine Lower Trunk Rotation  - 1 x daily - 7 x weekly - 3 sets - 10 reps - Modified Thomas Stretch  - 1 x daily - 7 x weekly - 3 reps - 60 hold  ASSESSMENT:  CLINICAL IMPRESSION: Patient is a 86 y.o. white male who was seen today for physical therapy evaluation and treatment for increased low back pain 2/2 levoscoliosis. He exhibits decreased hip strength and flexibility along with increased low back pain when standing or walking for prolonged periods of time. He will benefit from skilled PT to increase his hip mobility and strength and to decrease low back pain to walk his dog and complete yard work without an increase in his low back pain.     OBJECTIVE IMPAIRMENTS decreased strength, impaired flexibility, postural dysfunction, and pain.   ACTIVITY LIMITATIONS carrying, lifting, bending, standing, squatting, and stairs  PARTICIPATION LIMITATIONS: shopping, yard work, and church  PERSONAL FACTORS Age, Time since onset of injury/illness/exacerbation, and 1 comorbidity: Idiopathic scoliosis  are also affecting patient's functional outcome.   REHAB POTENTIAL: Good  CLINICAL DECISION MAKING: Stable/uncomplicated  EVALUATION COMPLEXITY: Low   GOALS: Goals reviewed with patient? No  SHORT TERM GOALS: Target date: 08/06/2022  Pt will be independent with HEP in order to improve strength and balance in order to decrease fall risk and improve function at home and work. Baseline: NT  Goal status: INITIAL    LONG TERM GOALS: Target date: 09/17/2022  Patient will have improved function and activity level as evidenced by an increase in FOTO score by 10 points or more.  Baseline: 58/100 Goal status: INITIAL  2.  Patient will improve hip strength by 1/2 MMT to help offload spinal structures to decrease pain in lumbar spine to return to standing activities without experiencing increased pain.  Baseline:  Hip Abd & Ext R/L 4/4, Hip Add R/L 4-/4- Goal status:  INITIAL  3.  Patient will maintain a standing posture for >=10 min without needing a seated rest break to indicate improved activity tolerance and decrease in low back pain symptoms.  Baseline: NT  Goal status: INITIAL    PLAN: PT FREQUENCY: 1-2x/week  PT DURATION: 8 weeks  PLANNED INTERVENTIONS: Therapeutic exercises, Therapeutic activity, Neuromuscular re-education, Gait training, Patient/Family education, Joint mobilization, Joint manipulation, Stair training, DME instructions, Aquatic Therapy, Dry Needling, Electrical stimulation, Spinal manipulation, Spinal mobilization, Cryotherapy, Moist heat, Traction, Manual therapy, and Re-evaluation.  PLAN FOR NEXT SESSION: Hip strengthening and stretching exercises. Measure standing endurance    Daneil Dan, PT 07/23/2022, 1:37 PM

## 2022-07-23 NOTE — Progress Notes (Signed)
  Physician Documentation  Your signature is required to indicate approval of the treatment plan as stated above. By signing this report, you are approving the plan of care. Please sign and either send electronically or print and fax the signed copy to the number below. If you approve with modifications, please indicate those in the space provided.___  Physician Signature: Elsie Stain  Date:___08/23/23 Time:__3:03 PM

## 2022-07-29 ENCOUNTER — Ambulatory Visit: Payer: PPO | Admitting: Physical Therapy

## 2022-07-29 DIAGNOSIS — M5459 Other low back pain: Secondary | ICD-10-CM | POA: Diagnosis not present

## 2022-07-29 NOTE — Therapy (Signed)
OUTPATIENT PHYSICAL THERAPY TREATMENT NOTE   Patient Name: Kenneth Ross MRN: [id number] DOB:10-Nov-1936, 86 y.o., male Today's Date: 07/29/2022  PCP: Dr.  Elsie Stain  REFERRING PROVIDER: Dr. Elsie Stain   END OF SESSION:   PT End of Session - 07/29/22 1550     Visit Number 2    Number of Visits 16    Date for PT Re-Evaluation 09/17/22    Authorization Type HTA    PT Start Time 1505    PT Stop Time 6945    PT Time Calculation (min) 40 min    Activity Tolerance Patient tolerated treatment well    Behavior During Therapy [facility] for tasks assessed/performed             Past Medical History:  Diagnosis Date   Allergic rhinitis, seasonal    Allergy    Bell's palsy    with L eyelid droop at baseline as of 2017   Blood transfusion without reported diagnosis    Cancer of kidney ([place])    Cataract    CKD (chronic kidney disease), stage III ([place])    followed by pcp   ED (erectile dysfunction)    GERD (gastroesophageal reflux disease)    occasional take tums   History of renal cell carcinoma    1986  s/p  left nephrectomy   Hyperlipidemia    Idiopathic scoliosis    Pneumonia    Prostate cancer [facility]) urologist-- dr dahlstedt/  oncologist-- dr [name]   dx 09-07-2019--- Stage T1c,  Gleason 4+3   Seasonal allergies    Sleep apnea    Cpap   Solitary right kidney    s/p  left nephrecotmy 1986   Tachycardia followed by pcp   HR 100 @ pcp office --- pt referred to cardiology for consult w/ dr t. Rockey Situ note in epic 12-14-2017, by pcp (dr Damita Dunnings) no work up done,  pt has lopressor as needed if heart rate is elevated as pt feels appropiate   Past Surgical History:  Procedure Laterality Date   CATARACT EXTRACTION Left 06/10/2018   Dr. Valetta Close   CATARACT EXTRACTION Right 2021   CATARACT EXTRACTION W/ INTRAOCULAR LENS IMPLANT Left    LAMINECTOMY  1993   L3-5   NEPHRECTOMY Left 10/1985   RADIOACTIVE SEED IMPLANT N/A 10/21/2019   Procedure: RADIOACTIVE SEED  IMPLANT/BRACHYTHERAPY IMPLANT;  Surgeon: Franchot Gallo, MD;  Location: [facility];  Service: Urology;  Laterality: N/A;  65 seeds implanted   SHOULDER OPEN ROTATOR CUFF REPAIR  12/1998   Right   SHOULDER OPEN ROTATOR CUFF REPAIR Bilateral 08/2001   SPACE OAR INSTILLATION N/A 10/21/2019   Procedure: SPACE OAR INSTILLATION;  Surgeon: Franchot Gallo, MD;  Location: [facility];  Service: Urology;  Laterality: N/A;   Patient Active Problem List   Diagnosis Date Noted   Sleep apnea    Acute renal failure superimposed on stage 3a chronic kidney disease (HCC)    Rash    H. pylori infection    Carpal tunnel syndrome 09/11/2021   Throat clearing 09/11/2021   Actinic keratosis 09/11/2021   Snoring 01/08/2020   Prostate cancer ([place]) 09/13/2019   Health care maintenance 08/31/2018   Tachycardia 10/27/2017   Idiopathic scoliosis 08/08/2015   Advance care planning 08/03/2014   Medicare annual wellness visit, subsequent 07/26/2012   Hypercholesteremia 07/24/2011   Renal insufficiency 07/24/2011   ERECTILE DYSFUNCTION, ORGANIC 05/21/2010   FOOT PAIN, RIGHT 02/18/2008   Renal cell cancer, left ([place]) 07/22/2007  REFERRING DIAG: M54.9 (ICD-10-CM) - Back pain, unspecified back location, unspecified back pain laterality, unspecified chronicity  THERAPY DIAG:  Other low back pain  Rationale for Evaluation and Treatment Rehabilitation  PERTINENT HISTORY: Patient reports that he has right levo-scoliosis for most of his life. For the past year, he has been experiencing worsening low back pain. He mostly experiences this on his right side and it increases when standing up. He has h/o of low back surgery where he had discetomy in 1993 which he reports helping significantly. For physical activity, he walks dogs 3x per day, mows lawn, and vacuums.   PRECAUTIONS: None   SUBJECTIVE: Patient reports some soreness in his low back and that exercises have been going  well.   PAIN:  Are you having pain? No    OBJECTIVE: (objective measures completed at initial evaluation unless otherwise dated)    VITALS: BP 104/63 HR 82 SpO2 100   DIAGNOSTIC FINDINGS:  Clinical Data:  Low back pain.  Radiculopathy.  Right hip and lower extremity pain and numbness.  Status post diskectomy in 1993.  History of renal cell carcinoma with left nephrectomy in 1986.  MRI LUMBAR SPINE WITHOUT AND WITH CONTRAST:  Technique:  Multiplanar and multiecho pulse sequences of the lumbar spine, to include the lower thoracic region and upper sacral regions, were obtained according to standard protocol before and after administration of intravenous contrast.  Contrast:  13 mL Omniscan.  Comparison:  None.  Findings:  As stated, the patient is status post left nephrectomy.  Soft tissues are otherwise unremarkable.  Normal signal is present in the conus medullaris which terminates at L1-2.  Vertebral body height and alignment are maintained.  There are endplate marrow changes at L2-3 and L5-S1.  Marrow signal is otherwise within normal limits.    L1-2:  Normal.  L2-3:  There is near complete loss of disc height with some disc bulging and significant osteophyte formation.  This results in moderate left neural foraminal narrowing.  The central canal and right neural foramen are patent.  Endplate changes are seen with sclerosis and mild enhancement.  L3-4:  A broad-based disc bulge and facet hypertrophy are present.  The disc bulge contacts the right L4 nerve roots within the lateral recess.  L3 nerve roots appear to exit without contact.  The right L3 nerve root is displaced posteriorly by the disc bulge, although not contacted.  L4-5:  A broad-based disc bulge, facet hypertrophy, and ligamentum flavum thickening are present.  This results in mild central canal stenosis with impact on the lateral recess bilaterally, right greater than left.  The disc bulge also contacts the right L4 nerve root with  moderate right neural foraminal narrowing.  The left neural foramen is patent.  L5-S1:  The patient is status post laminectomy at this level.  There is grade I anterolisthesis, greater on the right than left.  Some subluxation of the right facet joints is noted.  Moderate right neural foraminal narrowing secondary to this in conjunction with osteophyte formation of the S1 vertebral body.  There may be a pars defect on the right.  No definite pars defect is seen on the left.  Sclerotic endplate changes are noted.  IMPRESSION:  1.  Moderate left neural foraminal narrowing L2-3.  2.  Moderate right neural foraminal narrowing L4-5 and L5-S1.  3.  Mild central canal stenosis with impact on the lateral recess at L4-5.  4.  Anterolisthesis greater on the right at L5-S1.   Provider:  Donnella Bi, Wilford Grist   PATIENT SURVEYS:  FOTO 58/100 with target of  15   SCREENING FOR RED FLAGS: Bowel or bladder incontinence: No Spinal tumors: No Cauda equina syndrome: No Compression fracture: No Abdominal aneurysm: No   COGNITION:           Overall cognitive status: Within functional limits for tasks assessed                          SENSATION: Light touch: N/t on lateral side of right leg that radiates down from low back to ankle with some weakness in his right knee.    MUSCLE LENGTH: Hamstrings: Right 80 deg; Left 80 deg Thomas test: + bilateral   POSTURE: right pelvic obliquity   PALPATION: Right glute    LUMBAR ROM:    Active  A/PROM  eval  Flexion 100%  Extension 100%  Right lateral flexion 100%  Left lateral flexion 100%  Right rotation 100%*  Left rotation 100%   (Blank rows = not tested)   LOWER EXTREMITY ROM:            Active  Right 07/23/2022 Left 07/23/2022  Hip flexion 120 120  Hip extension 30 30  Hip abduction 45 45  Hip adduction 30 30  Hip internal rotation 45 45  Hip external rotation 45 45  Knee flexion 135 135  Knee extension 0 0  Ankle  dorsiflexion 20 20  Ankle plantarflexion 50 50  Ankle inversion 35 35  Ankle eversion 15 15   (Blank rows = not tested)        LOWER EXTREMITY MMT:     MMT Right eval Left eval  Hip flexion 5 5  Hip extension 4 4  Hip abduction 4 4  Hip adduction 4- 4-  Hip internal rotation      Hip external rotation      Knee flexion 5 5  Knee extension 5 5  Ankle dorsiflexion 5 5  Ankle plantarflexion      Ankle inversion      Ankle eversion       (Blank rows = not tested)   LUMBAR SPECIAL TESTS:  Straight leg raise test: Negative, FABER test: Negative, Thomas test: Positive, and Thigh Trust negative bilateral      GAIT: Distance walked: 40 ft  Assistive device utilized: None Level of assistance: Complete Independence Comments: No abnormalities        TODAY'S TREATMENT   07/29/22              Nu-Step level 5 for arms and feet              Hip Abduction with BUE support 1 x 10              Hip Abduction with 1 UE support 1 x 10              -min VC to keep toes from ER              Heel Raises without UE support 1 x 10              Heel Raises with jug of water (8 lbs) 1 x 10              Single Leg Heel Raises with BUE support 2 x 10              Single Leg Heel Raises with 1  UE 2 x 10              Seated QL stretch to left 3 x 30 sec             Mini-Squats with butt taps with BUE support 1 x 10             Initial  Lower Trunk Rotation 3 x 10  Modified Thomas Stretch 3 x 60 sec      PATIENT EDUCATION:  Education details: form and technique and explanation of plan of care   Person educated: Patient Education method: Explanation, Demonstration, Verbal cues, and Handouts Education comprehension: verbalized understanding and returned demonstration     HOME EXERCISE PROGRAM: Access Code: [email or web address] URL: [email or web address] Date: 07/29/2022 Prepared by: Bradly Chris  Exercises - Supine Lower Trunk Rotation  - 1 x daily - 3 sets - 10 reps -  Modified Thomas Stretch  - 1 x daily - 3 reps - 60 hold - Standing Hip Abduction with Counter Support  - 3 x weekly - 3 sets - 10 reps - Single Leg Heel Raise with Chair Support  - 3 x weekly - 3 sets - 10 reps - Seated Quadratus Lumborum Stretch in Chair  - 1 x daily - 3 reps - 30 hold - Mini Squat  - 3 x weekly - 3 sets - 10 reps   ASSESSMENT:   CLINICAL IMPRESSION: Pt presents for f/u for chronic right sided low back  in the setting of levoscoliosis. He was able to perform all exercises without an increase in his low back pain and pt requested a decrease in frequency to 1x per week which seems appropriate given his ability to perform exercises with min VC. He will continue to benefit from skilled PT to increase his hip mobility and strength and to decrease low back pain to walk his dog and complete yard work without an increase in his low back pain.      OBJECTIVE IMPAIRMENTS decreased strength, impaired flexibility, postural dysfunction, and pain.    ACTIVITY LIMITATIONS carrying, lifting, bending, standing, squatting, and stairs   PARTICIPATION LIMITATIONS: shopping, yard work, and church   PERSONAL FACTORS Age, Time since onset of injury/illness/exacerbation, and 1 comorbidity: Idiopathic scoliosis  are also affecting patient's functional outcome.    REHAB POTENTIAL: Good   CLINICAL DECISION MAKING: Stable/uncomplicated   EVALUATION COMPLEXITY: Low     GOALS: Goals reviewed with patient? No   SHORT TERM GOALS: Target date: 08/06/2022   Pt will be independent with HEP in order to improve strength and balance in order to decrease fall risk and improve function at home and work. Baseline: NT  Goal status: INITIAL       LONG TERM GOALS: Target date: 09/17/2022   Patient will have improved function and activity level as evidenced by an increase in FOTO score by 10 points or more.  Baseline: 58/100 Goal status: INITIAL   2.  Patient will improve hip strength by 1/2 MMT to  help offload spinal structures to decrease pain in lumbar spine to return to standing activities without experiencing increased pain.  Baseline:  Hip Abd & Ext R/L 4/4, Hip Add R/L 4-/4- Goal status: INITIAL   3.  Patient will maintain a standing posture for >=10 min without needing a seated rest break to indicate improved activity tolerance and decrease in low back pain symptoms.  Baseline: NT  Goal status: INITIAL       PLAN: PT FREQUENCY: 1-2x/week  PT DURATION: 8 weeks   PLANNED INTERVENTIONS: Therapeutic exercises, Therapeutic activity, Neuromuscular re-education, Gait training, Patient/Family education, Joint mobilization, Joint manipulation, Stair training, DME instructions, Aquatic Therapy, Dry Needling, Electrical stimulation, Spinal manipulation, Spinal mobilization, Cryotherapy, Moist heat, Traction, Manual therapy, and Re-evaluation.   PLAN FOR NEXT SESSION: Hip strengthening and stretching exercises. Measure standing endurance   Bradly Chris PT, DPT  07/29/2022, 3:51 PM

## 2022-07-30 ENCOUNTER — Ambulatory Visit: Payer: PPO | Admitting: Pulmonary Disease

## 2022-07-30 ENCOUNTER — Encounter: Payer: Self-pay | Admitting: Pulmonary Disease

## 2022-07-30 VITALS — BP 100/70 | HR 80 | Ht 64.0 in | Wt 142.2 lb

## 2022-07-30 DIAGNOSIS — G4733 Obstructive sleep apnea (adult) (pediatric): Secondary | ICD-10-CM

## 2022-07-30 DIAGNOSIS — Z9989 Dependence on other enabling machines and devices: Secondary | ICD-10-CM | POA: Diagnosis not present

## 2022-07-30 NOTE — Progress Notes (Signed)
[facility]  Chief Complaint  Patient presents with   Follow-up    Follow-up    Constitutional:  BP 100/70 (BP Location: Left Arm)   Pulse 80   Ht '5\' 4"'$  (1.626 m)   Wt 142 lb 3.2 oz (64.5 kg)   SpO2 96%   BMI 24.41 kg/m   Past Medical History:  Allergies, Bells palsy, CKD 3, ED, GERD, Renal cell carcinoma, HLD, Scoliosis, Prostate cancer  Past Surgical History:  He  has a past surgical history that includes Nephrectomy (Left, 10/1985); Laminectomy (1993); Shoulder open rotator cuff repair (12/1998); Shoulder open rotator cuff repair (Bilateral, 08/2001); Cataract extraction (Left, 06/10/2018); Cataract extraction w/ intraocular lens implant (Left); Radioactive seed implant (N/A, 10/21/2019); SPACE OAR INSTILLATION (N/A, 10/21/2019); and Cataract extraction (Right, 2021).  Brief Summary:  Kenneth Ross is a 86 y.o. male with  obstructive sleep apnea.      Subjective:   Uses CPAP nightly.  Has full face mask.  Gets fluttering from the sides of his mask and this wakes him up.  Also gets dry mouth when this happens.  Asked about Inspire device.  Physical Exam:   Appearance - well kempt   ENMT - no sinus tenderness, no oral exudate, no LAN, Mallampati 3 airway, no stridor  Respiratory - equal breath sounds bilaterally, no wheezing or rales  CV - s1s2 regular rate and rhythm, no murmurs  Ext - no clubbing, no edema  Skin - no rashes  Psych - normal mood and affect    Sleep Tests:  HST 03/14/20 >> AHI 47.6, SpO2 low 69% Auto CPAP 04/30/22 to 07/28/22 >> used on 86 of 90 nights with average 5 hrs 15 min.  Average AHI 4.8 with median CPAP 9 and 95 th percentile CPAP 10 cm H2O  Social History:  He  reports that he has never smoked. He has never used smokeless tobacco. He reports that he does not drink alcohol and does not use drugs.  Family History:  His family history includes Arthritis in his mother; Bladder Cancer in  his cousin; Bronchitis in his daughter; COPD in his daughter; Cancer in his father, maternal grandmother, and mother; Colon cancer in his cousin; Diabetes in his sister; Esophageal cancer in his paternal aunt; Fibromyalgia in his mother; GER disease in his sister; Gallbladder disease in his father, mother, and paternal grandmother; Heart disease in his father; Hepatitis C in his sister; Hypertension in his father and mother; Liver cancer in his sister; Liver disease in his daughter; Lupus in his sister; Prostate cancer in his father; Rectal cancer in his cousin; Stroke in his father, mother, and paternal grandfather.     Assessment/Plan:   Obstructive sleep apnea. - he is compliant with CPAP and reports benefit from therapy - he uses [facility] for his DME - current CPAP ordered May 2021 - will change auto CPAP to 5 - 8 cm H2O to see if this improves mask leak and mouth dryness - discussed pros/cons of Inspire device    Perennial rhinitis with CPAP rhinitis.  - prn claritin, azelastine  Time Spent Involved in Patient Care on Day of Examination:  27 minutes  Follow up:   Patient Instructions  Will have [facility] change your auto CPAP to 5 - 8 cm water pressure  Follow up in 1 year  Medication List:   Allergies as of 07/30/2022       Reactions   Ibuprofen    Caution re:  kidney function   Iodinated Contrast Media Nausea And Vomiting   IV dye   Talicia [amoxicill-rifabutin-omeprazole]    RASH.  He doesn't have PCN allergy hx and he has been able to tolerate omeprazole.  Rifabutin was likely the issue.          Medication List        Accurate as of July 30, 2022 11:15 AM. If you have any questions, ask your nurse or doctor.          aspirin 81 MG tablet Take 81 mg by mouth daily.   Azelastine HCl 0.15 % Soln Place 1 spray into the nose 2 (two) times daily as needed (Allergies).   Calcium 500-125 MG-UNIT Tabs Take 1 tablet by mouth daily.   calcium carbonate 500 MG  chewable tablet Commonly known as: TUMS - dosed in mg elemental calcium Chew 1 tablet by mouth daily as needed for indigestion or heartburn.   diphenhydrAMINE 50 MG tablet Commonly known as: BENADRYL Take 0.5 tablets (25 mg total) by mouth every 8 (eight) hours as needed for itching or allergies.   fish oil-omega-3 fatty acids 1000 MG capsule Take 1 g by mouth daily.   loratadine 10 MG tablet Commonly known as: CLARITIN Take 10 mg by mouth daily.   metoprolol tartrate 25 MG tablet Commonly known as: LOPRESSOR Take 0.5-1 tablets (12.5-25 mg total) by mouth 2 (two) times daily as needed (if pulse is persistently above 100).   multivitamin capsule Take 1 capsule by mouth daily.   sildenafil 20 MG tablet Commonly known as: REVATIO TAKE 3 TO 5 TABLETS BY MOUTH ONCE DAILY AS NEEDED   simvastatin 40 MG tablet Commonly known as: ZOCOR Take 1 tablet (40 mg total) by mouth every evening.   Vitamin D 1000 units capsule Take 1,000 Units by mouth daily.        Signature:  Chesley Mires, MD [place] Pager - [phone] 07/30/2022, 11:15 AM

## 2022-07-30 NOTE — Patient Instructions (Signed)
Will have [facility] change your auto CPAP to 5 - 8 cm water pressure  Follow up in 1 year

## 2022-07-31 ENCOUNTER — Ambulatory Visit: Payer: PPO | Admitting: Physical Therapy

## 2022-08-05 ENCOUNTER — Ambulatory Visit: Payer: PPO | Attending: Family Medicine | Admitting: Physical Therapy

## 2022-08-05 ENCOUNTER — Encounter: Payer: Self-pay | Admitting: Physical Therapy

## 2022-08-05 DIAGNOSIS — M5459 Other low back pain: Secondary | ICD-10-CM | POA: Insufficient documentation

## 2022-08-05 NOTE — Therapy (Signed)
OUTPATIENT PHYSICAL THERAPY TREATMENT NOTE   Patient Name: Kenneth Ross MRN: [id number] DOB:[birth year], 86 y.o., male Today's Date: 08/05/2022  PCP: Dr.  Elsie Stain  REFERRING PROVIDER: Dr. Elsie Stain   END OF SESSION:   PT End of Session - 08/05/22 1535     Visit Number 3    Number of Visits 16    Date for PT Re-Evaluation 09/17/22    Authorization Type HTA    PT Start Time 1530    PT Stop Time 1615    PT Time Calculation (min) 45 min    Activity Tolerance Patient tolerated treatment well    Behavior During Therapy [facility] for tasks assessed/performed             Past Medical History:  Diagnosis Date   Allergic rhinitis, seasonal    Allergy    Bell's palsy    with L eyelid droop at baseline as of 2017   Blood transfusion without reported diagnosis    Cancer of kidney ([place])    Cataract    CKD (chronic kidney disease), stage III ([place])    followed by pcp   ED (erectile dysfunction)    GERD (gastroesophageal reflux disease)    occasional take tums   History of renal cell carcinoma    1986  s/p  left nephrectomy   Hyperlipidemia    Idiopathic scoliosis    Pneumonia    Prostate cancer [facility]) urologist-- dr dahlstedt/  oncologist-- dr [name]   dx 09-07-2019--- Stage T1c,  Gleason 4+3   Seasonal allergies    Sleep apnea    Cpap   Solitary right kidney    s/p  left nephrecotmy 1986   Tachycardia followed by pcp   HR 100 @ pcp office --- pt referred to cardiology for consult w/ dr t. Rockey Situ note in epic 12-14-2017, by pcp (dr Damita Dunnings) no work up done,  pt has lopressor as needed if heart rate is elevated as pt feels appropiate   Past Surgical History:  Procedure Laterality Date   CATARACT EXTRACTION Left 06/10/2018   Dr. Valetta Close   CATARACT EXTRACTION Right 2021   CATARACT EXTRACTION W/ INTRAOCULAR LENS IMPLANT Left    LAMINECTOMY  1993   L3-5   NEPHRECTOMY Left 10/1985   RADIOACTIVE SEED IMPLANT N/A 10/21/2019   Procedure: RADIOACTIVE SEED  IMPLANT/BRACHYTHERAPY IMPLANT;  Surgeon: Franchot Gallo, MD;  Location: [facility];  Service: Urology;  Laterality: N/A;  65 seeds implanted   SHOULDER OPEN ROTATOR CUFF REPAIR  12/1998   Right   SHOULDER OPEN ROTATOR CUFF REPAIR Bilateral 08/2001   SPACE OAR INSTILLATION N/A 10/21/2019   Procedure: SPACE OAR INSTILLATION;  Surgeon: Franchot Gallo, MD;  Location: [facility];  Service: Urology;  Laterality: N/A;   Patient Active Problem List   Diagnosis Date Noted   Sleep apnea    Acute renal failure superimposed on stage 3a chronic kidney disease (HCC)    Rash    H. pylori infection    Carpal tunnel syndrome 09/11/2021   Throat clearing 09/11/2021   Actinic keratosis 09/11/2021   Snoring 01/08/2020   Prostate cancer ([place]) 09/13/2019   Health care maintenance 08/31/2018   Tachycardia 10/27/2017   Idiopathic scoliosis 08/08/2015   Advance care planning 08/03/2014   Medicare annual wellness visit, subsequent 07/26/2012   Hypercholesteremia 07/24/2011   Renal insufficiency 07/24/2011   ERECTILE DYSFUNCTION, ORGANIC 05/21/2010   FOOT PAIN, RIGHT 02/18/2008   Renal cell cancer, left ([place]) 07/22/2007  REFERRING DIAG: M54.9 (ICD-10-CM) - Back pain, unspecified back location, unspecified back pain laterality, unspecified chronicity  THERAPY DIAG:  Other low back pain  Rationale for Evaluation and Treatment Rehabilitation  PERTINENT HISTORY: Patient reports that he has right levo-scoliosis for most of his life. For the past year, he has been experiencing worsening low back pain. He mostly experiences this on his right side and it increases when standing up. He has h/o of low back surgery where he had discetomy in 1993 which he reports helping significantly. For physical activity, he walks dogs 3x per day, mows lawn, and vacuums.   PRECAUTIONS: None   SUBJECTIVE: Patient reports no soreness in back this time, but he did not have do yard work  since before last session.   PAIN:  Are you having pain? No    OBJECTIVE: (objective measures completed at initial evaluation unless otherwise dated)    VITALS: BP 104/63 HR 82 SpO2 100   DIAGNOSTIC FINDINGS:  Clinical Data:  Low back pain.  Radiculopathy.  Right hip and lower extremity pain and numbness.  Status post diskectomy in 1993.  History of renal cell carcinoma with left nephrectomy in 1986.  MRI LUMBAR SPINE WITHOUT AND WITH CONTRAST:  Technique:  Multiplanar and multiecho pulse sequences of the lumbar spine, to include the lower thoracic region and upper sacral regions, were obtained according to standard protocol before and after administration of intravenous contrast.  Contrast:  13 mL Omniscan.  Comparison:  None.  Findings:  As stated, the patient is status post left nephrectomy.  Soft tissues are otherwise unremarkable.  Normal signal is present in the conus medullaris which terminates at L1-2.  Vertebral body height and alignment are maintained.  There are endplate marrow changes at L2-3 and L5-S1.  Marrow signal is otherwise within normal limits.    L1-2:  Normal.  L2-3:  There is near complete loss of disc height with some disc bulging and significant osteophyte formation.  This results in moderate left neural foraminal narrowing.  The central canal and right neural foramen are patent.  Endplate changes are seen with sclerosis and mild enhancement.  L3-4:  A broad-based disc bulge and facet hypertrophy are present.  The disc bulge contacts the right L4 nerve roots within the lateral recess.  L3 nerve roots appear to exit without contact.  The right L3 nerve root is displaced posteriorly by the disc bulge, although not contacted.  L4-5:  A broad-based disc bulge, facet hypertrophy, and ligamentum flavum thickening are present.  This results in mild central canal stenosis with impact on the lateral recess bilaterally, right greater than left.  The disc bulge also contacts the  right L4 nerve root with moderate right neural foraminal narrowing.  The left neural foramen is patent.  L5-S1:  The patient is status post laminectomy at this level.  There is grade I anterolisthesis, greater on the right than left.  Some subluxation of the right facet joints is noted.  Moderate right neural foraminal narrowing secondary to this in conjunction with osteophyte formation of the S1 vertebral body.  There may be a pars defect on the right.  No definite pars defect is seen on the left.  Sclerotic endplate changes are noted.  IMPRESSION:  1.  Moderate left neural foraminal narrowing L2-3.  2.  Moderate right neural foraminal narrowing L4-5 and L5-S1.  3.  Mild central canal stenosis with impact on the lateral recess at L4-5.  4.  Anterolisthesis greater on the right  at L5-S1.   Provider: Donnella Bi, Wilford Grist   PATIENT SURVEYS:  FOTO 58/100 with target of  33   SCREENING FOR RED FLAGS: Bowel or bladder incontinence: No Spinal tumors: No Cauda equina syndrome: No Compression fracture: No Abdominal aneurysm: No   COGNITION:           Overall cognitive status: Within functional limits for tasks assessed                          SENSATION: Light touch: N/t on lateral side of right leg that radiates down from low back to ankle with some weakness in his right knee.    MUSCLE LENGTH: Hamstrings: Right 80 deg; Left 80 deg Thomas test: + bilateral   POSTURE: right pelvic obliquity   PALPATION: Right glute    LUMBAR ROM:    Active  A/PROM  eval  Flexion 100%  Extension 100%  Right lateral flexion 100%  Left lateral flexion 100%  Right rotation 100%*  Left rotation 100%   (Blank rows = not tested)   LOWER EXTREMITY ROM:            Active  Right 07/23/2022 Left 07/23/2022  Hip flexion 120 120  Hip extension 30 30  Hip abduction 45 45  Hip adduction 30 30  Hip internal rotation 45 45  Hip external rotation 45 45  Knee flexion 135 135  Knee  extension 0 0  Ankle dorsiflexion 20 20  Ankle plantarflexion 50 50  Ankle inversion 35 35  Ankle eversion 15 15   (Blank rows = not tested)        LOWER EXTREMITY MMT:     MMT Right eval Left eval  Hip flexion 5 5  Hip extension 4 4  Hip abduction 4 4  Hip adduction 4- 4-  Hip internal rotation      Hip external rotation      Knee flexion 5 5  Knee extension 5 5  Ankle dorsiflexion 5 5  Ankle plantarflexion      Ankle inversion      Ankle eversion       (Blank rows = not tested)   LUMBAR SPECIAL TESTS:  Straight leg raise test: Negative, FABER test: Negative, Thomas test: Positive, and Thigh Trust negative bilateral      GAIT: Distance walked: 40 ft  Assistive device utilized: None Level of assistance: Complete Independence Comments: No abnormalities        TODAY'S TREATMENT   08/05/22              Nu-Step level 5 for arms and feet with resistance level 4               Single Leg Stance 2 x 5 x 10 sec on RLE and LLE               Matrix Hip Abduction #25 3 x 10               Matrix Hip Adduction #40 3 x 10               Quadruped Full Range Thoracic Rotation with Reach 2 x 10               Bird Dog 1 x 10               Bird Dog 2 x 8 with #5 DB  Lat Stretch 3 x 30 sec on LLE and RLE                -min VC for increased hip rotation for increased stretch    07/29/22              Nu-Step level 5 for arms and feet              Hip Abduction with BUE support 1 x 10              Hip Abduction with 1 UE support 1 x 10              -min VC to keep toes from ER              Heel Raises without UE support 1 x 10              Heel Raises with jug of water (8 lbs) 1 x 10              Single Leg Heel Raises with BUE support 2 x 10              Single Leg Heel Raises with 1 UE 2 x 10              Seated QL stretch to left 3 x 30 sec             Mini-Squats with butt taps with BUE support 1 x 10             Initial  Lower Trunk Rotation 3 x 10   Modified Thomas Stretch 3 x 60 sec      PATIENT EDUCATION:  Education details: form and technique and explanation of plan of care   Person educated: Patient Education method: Explanation, Demonstration, Verbal cues, and Handouts Education comprehension: verbalized understanding and returned demonstration     HOME EXERCISE PROGRAM: Access Code: [email or web address] URL: [email or web address] Date: 08/05/2022 Prepared by: Bradly Chris  Exercises - Supine Lower Trunk Rotation  - 1 x daily - 3 sets - 10 reps - Modified Thomas Stretch  - 1 x daily - 3 reps - 60 hold - Standing Hip Abduction with Counter Support  - 3 x weekly - 3 sets - 10 reps - Single Leg Heel Raise with Chair Support  - 3 x weekly - 3 sets - 10 reps - Mini Squat  - 3 x weekly - 3 sets - 10 reps - Quadruped Full Range Thoracic Rotation with Reach  - 1 x daily - 2 sets - 10 reps - Bird Dog  - 3 x weekly - 3 sets - 8 reps - Latissimus Dorsi Stretch at Wall  - 3 reps - 60 hold   ASSESSMENT:   CLINICAL IMPRESSION:  Pt exhibits improved hip strength with ability to perform hip exercises with increased resistance. He continues to experience increased muscular tension with spinal mobility exercises that stress stretching muscles on right side of body or where he is experiencing his scoliosis curve. He will continue to benefit from skilled PT to increase his hip mobility and strength and to decrease low back pain to walk his dog and complete yard work without an increase in his low back pain.    OBJECTIVE IMPAIRMENTS decreased strength, impaired flexibility, postural dysfunction, and pain.    ACTIVITY LIMITATIONS carrying, lifting, bending, standing, squatting, and stairs   PARTICIPATION LIMITATIONS: shopping, yard work, and church   PERSONAL FACTORS Age, Time since  onset of injury/illness/exacerbation, and 1 comorbidity: Idiopathic scoliosis  are also affecting patient's functional outcome.    REHAB POTENTIAL:  Good   CLINICAL DECISION MAKING: Stable/uncomplicated   EVALUATION COMPLEXITY: Low     GOALS: Goals reviewed with patient? No   SHORT TERM GOALS: Target date: 08/06/2022   Pt will be independent with HEP in order to improve strength and balance in order to decrease fall risk and improve function at home and work. Baseline: NT  Goal status: Ongoing        LONG TERM GOALS: Target date: 09/17/2022   Patient will have improved function and activity level as evidenced by an increase in FOTO score by 10 points or more.  Baseline: 58/100 Goal status: Ongoing    2.  Patient will improve hip strength by 1/2 MMT to help offload spinal structures to decrease pain in lumbar spine to return to standing activities without experiencing increased pain.  Baseline:  Hip Abd & Ext R/L 4/4, Hip Add R/L 4-/4- Goal status: Ongoing    3.  Patient will maintain a standing posture for >=10 min without needing a seated rest break to indicate improved activity tolerance and decrease in low back pain symptoms.  Baseline: NT  Goal status: Ongoing        PLAN: PT FREQUENCY: 1-2x/week   PT DURATION: 8 weeks   PLANNED INTERVENTIONS: Therapeutic exercises, Therapeutic activity, Neuromuscular re-education, Gait training, Patient/Family education, Joint mobilization, Joint manipulation, Stair training, DME instructions, Aquatic Therapy, Dry Needling, Electrical stimulation, Spinal manipulation, Spinal mobilization, Cryotherapy, Moist heat, Traction, Manual therapy, and Re-evaluation.   PLAN FOR NEXT SESSION: Hip strengthening and stretching exercises. Measure standing endurance   Bradly Chris PT, DPT  08/05/2022, 3:36 PM

## 2022-08-07 ENCOUNTER — Encounter: Payer: PPO | Admitting: Physical Therapy

## 2022-08-12 ENCOUNTER — Ambulatory Visit: Payer: PPO | Admitting: Physical Therapy

## 2022-08-12 ENCOUNTER — Encounter: Payer: Self-pay | Admitting: Physical Therapy

## 2022-08-12 DIAGNOSIS — M5459 Other low back pain: Secondary | ICD-10-CM

## 2022-08-12 NOTE — Therapy (Signed)
OUTPATIENT PHYSICAL THERAPY TREATMENT NOTE   Patient Name: Kenneth Ross MRN: [id number] DOB:08-14-1936, 86 y.o., male Today's Date: 08/12/2022  PCP: Dr.  Elsie Stain  REFERRING PROVIDER: Dr. Elsie Stain   END OF SESSION:   PT End of Session - 08/12/22 1154     Visit Number 4    Number of Visits 16    Date for PT Re-Evaluation 09/17/22    Authorization Type HTA    PT Start Time 1150    PT Stop Time 1230    PT Time Calculation (min) 40 min    Activity Tolerance Patient tolerated treatment well    Behavior During Therapy [facility] for tasks assessed/performed             Past Medical History:  Diagnosis Date   Allergic rhinitis, seasonal    Allergy    Bell's palsy    with L eyelid droop at baseline as of 2017   Blood transfusion without reported diagnosis    Cancer of kidney ([place])    Cataract    CKD (chronic kidney disease), stage III ([place])    followed by pcp   ED (erectile dysfunction)    GERD (gastroesophageal reflux disease)    occasional take tums   History of renal cell carcinoma    1986  s/p  left nephrectomy   Hyperlipidemia    Idiopathic scoliosis    Pneumonia    Prostate cancer [facility]) urologist-- dr dahlstedt/  oncologist-- dr [name]   dx 09-07-2019--- Stage T1c,  Gleason 4+3   Seasonal allergies    Sleep apnea    Cpap   Solitary right kidney    s/p  left nephrecotmy 1986   Tachycardia followed by pcp   HR 100 @ pcp office --- pt referred to cardiology for consult w/ dr t. Rockey Situ note in epic 12-14-2017, by pcp (dr Damita Dunnings) no work up done,  pt has lopressor as needed if heart rate is elevated as pt feels appropiate   Past Surgical History:  Procedure Laterality Date   CATARACT EXTRACTION Left 06/10/2018   Dr. Valetta Close   CATARACT EXTRACTION Right 2021   CATARACT EXTRACTION W/ INTRAOCULAR LENS IMPLANT Left    LAMINECTOMY  1993   L3-5   NEPHRECTOMY Left 10/1985   RADIOACTIVE SEED IMPLANT N/A 10/21/2019   Procedure: RADIOACTIVE SEED  IMPLANT/BRACHYTHERAPY IMPLANT;  Surgeon: Franchot Gallo, MD;  Location: [facility];  Service: Urology;  Laterality: N/A;  65 seeds implanted   SHOULDER OPEN ROTATOR CUFF REPAIR  12/1998   Right   SHOULDER OPEN ROTATOR CUFF REPAIR Bilateral 08/2001   SPACE OAR INSTILLATION N/A 10/21/2019   Procedure: SPACE OAR INSTILLATION;  Surgeon: Franchot Gallo, MD;  Location: [facility];  Service: Urology;  Laterality: N/A;   Patient Active Problem List   Diagnosis Date Noted   Sleep apnea    Acute renal failure superimposed on stage 3a chronic kidney disease (HCC)    Rash    H. pylori infection    Carpal tunnel syndrome 09/11/2021   Throat clearing 09/11/2021   Actinic keratosis 09/11/2021   Snoring 01/08/2020   Prostate cancer ([place]) 09/13/2019   Health care maintenance 08/31/2018   Tachycardia 10/27/2017   Idiopathic scoliosis 08/08/2015   Advance care planning 08/03/2014   Medicare annual wellness visit, subsequent 07/26/2012   Hypercholesteremia 07/24/2011   Renal insufficiency 07/24/2011   ERECTILE DYSFUNCTION, ORGANIC 05/21/2010   FOOT PAIN, RIGHT 02/18/2008   Renal cell cancer, left ([place]) 07/22/2007  REFERRING DIAG: M54.9 (ICD-10-CM) - Back pain, unspecified back location, unspecified back pain laterality, unspecified chronicity  THERAPY DIAG:  Other low back pain  Rationale for Evaluation and Treatment Rehabilitation  PERTINENT HISTORY: Patient reports that he has right levo-scoliosis for most of his life. For the past year, he has been experiencing worsening low back pain. He mostly experiences this on his right side and it increases when standing up. He has h/o of low back surgery where he had discetomy in 1993 which he reports helping significantly. For physical activity, he walks dogs 3x per day, mows lawn, and vacuums.   PRECAUTIONS: None   SUBJECTIVE: Patient reports that exercises are getting easier and that his back pain is  decreasing some.   PAIN:  Are you having pain? Yes, 1/10, Right low back pain    OBJECTIVE: (objective measures completed at initial evaluation unless otherwise dated)    VITALS: BP 104/63 HR 82 SpO2 100   DIAGNOSTIC FINDINGS:  Clinical Data:  Low back pain.  Radiculopathy.  Right hip and lower extremity pain and numbness.  Status post diskectomy in 1993.  History of renal cell carcinoma with left nephrectomy in 1986.  MRI LUMBAR SPINE WITHOUT AND WITH CONTRAST:  Technique:  Multiplanar and multiecho pulse sequences of the lumbar spine, to include the lower thoracic region and upper sacral regions, were obtained according to standard protocol before and after administration of intravenous contrast.  Contrast:  13 mL Omniscan.  Comparison:  None.  Findings:  As stated, the patient is status post left nephrectomy.  Soft tissues are otherwise unremarkable.  Normal signal is present in the conus medullaris which terminates at L1-2.  Vertebral body height and alignment are maintained.  There are endplate marrow changes at L2-3 and L5-S1.  Marrow signal is otherwise within normal limits.    L1-2:  Normal.  L2-3:  There is near complete loss of disc height with some disc bulging and significant osteophyte formation.  This results in moderate left neural foraminal narrowing.  The central canal and right neural foramen are patent.  Endplate changes are seen with sclerosis and mild enhancement.  L3-4:  A broad-based disc bulge and facet hypertrophy are present.  The disc bulge contacts the right L4 nerve roots within the lateral recess.  L3 nerve roots appear to exit without contact.  The right L3 nerve root is displaced posteriorly by the disc bulge, although not contacted.  L4-5:  A broad-based disc bulge, facet hypertrophy, and ligamentum flavum thickening are present.  This results in mild central canal stenosis with impact on the lateral recess bilaterally, right greater than left.  The disc bulge  also contacts the right L4 nerve root with moderate right neural foraminal narrowing.  The left neural foramen is patent.  L5-S1:  The patient is status post laminectomy at this level.  There is grade I anterolisthesis, greater on the right than left.  Some subluxation of the right facet joints is noted.  Moderate right neural foraminal narrowing secondary to this in conjunction with osteophyte formation of the S1 vertebral body.  There may be a pars defect on the right.  No definite pars defect is seen on the left.  Sclerotic endplate changes are noted.  IMPRESSION:  1.  Moderate left neural foraminal narrowing L2-3.  2.  Moderate right neural foraminal narrowing L4-5 and L5-S1.  3.  Mild central canal stenosis with impact on the lateral recess at L4-5.  4.  Anterolisthesis greater on the right  at L5-S1.   Provider: Donnella Bi, Wilford Grist   PATIENT SURVEYS:  FOTO 58/100 with target of  67   SCREENING FOR RED FLAGS: Bowel or bladder incontinence: No Spinal tumors: No Cauda equina syndrome: No Compression fracture: No Abdominal aneurysm: No   COGNITION:           Overall cognitive status: Within functional limits for tasks assessed                          SENSATION: Light touch: N/t on lateral side of right leg that radiates down from low back to ankle with some weakness in his right knee.    MUSCLE LENGTH: Hamstrings: Right 80 deg; Left 80 deg Thomas test: + bilateral   POSTURE: right pelvic obliquity   PALPATION: Right glute    LUMBAR ROM:    Active  A/PROM  eval  Flexion 100%  Extension 100%  Right lateral flexion 100%  Left lateral flexion 100%  Right rotation 100%*  Left rotation 100%   (Blank rows = not tested)   LOWER EXTREMITY ROM:            Active  Right 07/23/2022 Left 07/23/2022  Hip flexion 120 120  Hip extension 30 30  Hip abduction 45 45  Hip adduction 30 30  Hip internal rotation 45 45  Hip external rotation 45 45  Knee flexion  135 135  Knee extension 0 0  Ankle dorsiflexion 20 20  Ankle plantarflexion 50 50  Ankle inversion 35 35  Ankle eversion 15 15   (Blank rows = not tested)        LOWER EXTREMITY MMT:     MMT Right eval Left eval  Hip flexion 5 5  Hip extension 4 4  Hip abduction 4 4  Hip adduction 4- 4-  Hip internal rotation      Hip external rotation      Knee flexion 5 5  Knee extension 5 5  Ankle dorsiflexion 5 5  Ankle plantarflexion      Ankle inversion      Ankle eversion       (Blank rows = not tested)   LUMBAR SPECIAL TESTS:  Straight leg raise test: Negative, FABER test: Negative, Thomas test: Positive, and Thigh Trust negative bilateral      GAIT: Distance walked: 40 ft  Assistive device utilized: None Level of assistance: Complete Independence Comments: No abnormalities        TODAY'S TREATMENT   08/12/22:             Nu-Step level 5 for arms and feet with resistance level 4              OMEGA LE Press #65 1 x 10              OMEGA LE Press #75 2 x 10               Standing Hip Abduction 3 x 10 without UE support               Mini-Squat with #8 DB 3 x 10              Matrix Standing Hip Adduction #55 2 x 10              Matrix Standing Hip Adduction #75 1 x 10  Lateral Trunk Leans #10 1 x 10              Lateral Trunk Leans #25 2 x 10    08/05/22              Nu-Step level 5 for arms and feet with resistance level 4               Single Leg Stance 2 x 5 x 10 sec on RLE and LLE               Matrix Hip Abduction #25 3 x 10               Matrix Hip Adduction #40 3 x 10               Quadruped Full Range Thoracic Rotation with Reach 2 x 10               Bird Dog 1 x 10               Bird Dog 2 x 8 with #5 DB               Lat Stretch 3 x 30 sec on LLE and RLE                -min VC for increased hip rotation for increased stretch    07/29/22              Nu-Step level 5 for arms and feet              Hip Abduction with BUE support 1 x 10               Hip Abduction with 1 UE support 1 x 10              -min VC to keep toes from ER              Heel Raises without UE support 1 x 10              Heel Raises with jug of water (8 lbs) 1 x 10              Single Leg Heel Raises with BUE support 2 x 10              Single Leg Heel Raises with 1 UE 2 x 10              Seated QL stretch to left 3 x 30 sec             Mini-Squats with butt taps with BUE support 1 x 10             Initial  Lower Trunk Rotation 3 x 10  Modified Thomas Stretch 3 x 60 sec      PATIENT EDUCATION:  Education details: form and technique and explanation of plan of care   Person educated: Patient Education method: Explanation, Demonstration, Verbal cues, and Handouts Education comprehension: verbalized understanding and returned demonstration     HOME EXERCISE PROGRAM: Access Code: [email or web address] URL: [email or web address] Date: 08/12/2022 Prepared by: Bradly Chris  Exercises - Supine Lower Trunk Rotation  - 1 x daily - 3 sets - 10 reps - Modified Thomas Stretch  - 1 x daily - 3 reps - 60 hold - Standing Hip Abduction with Counter Support  - 3 x weekly - 3 sets - 10 reps - Single Leg Heel  Raise with Chair Support  - 3 x weekly - 3 sets - 10 reps - Mini Squat  - 3 x weekly - 3 sets - 10 reps - Quadruped Full Range Thoracic Rotation with Reach  - 1 x daily - 2 sets - 10 reps - Bird Dog  - 3 x weekly - 3 sets - 8 reps - Latissimus Dorsi Stretch at Wall  - 3 reps - 60 hold   ASSESSMENT:   CLINICAL IMPRESSION:  Pt exhibits improvement in hip strength with ability to perform standing hip strength exercises without UE support and increased resistance. He will continue to benefit from skilled PT to increase his hip mobility and strength and to decrease low back pain to walk his dog and complete yard work without an increase in his low back pain.    OBJECTIVE IMPAIRMENTS decreased strength, impaired flexibility, postural dysfunction, and pain.     ACTIVITY LIMITATIONS carrying, lifting, bending, standing, squatting, and stairs   PARTICIPATION LIMITATIONS: shopping, yard work, and church   PERSONAL FACTORS Age, Time since onset of injury/illness/exacerbation, and 1 comorbidity: Idiopathic scoliosis  are also affecting patient's functional outcome.    REHAB POTENTIAL: Good   CLINICAL DECISION MAKING: Stable/uncomplicated   EVALUATION COMPLEXITY: Low     GOALS: Goals reviewed with patient? No   SHORT TERM GOALS: Target date: 08/06/2022   Pt will be independent with HEP in order to improve strength and balance in order to decrease fall risk and improve function at home and work. Baseline: NT  Goal status: Ongoing        LONG TERM GOALS: Target date: 09/17/2022   Patient will have improved function and activity level as evidenced by an increase in FOTO score by 10 points or more.  Baseline: 58/100 Goal status: Ongoing    2.  Patient will improve hip strength by 1/2 MMT to help offload spinal structures to decrease pain in lumbar spine to return to standing activities without experiencing increased pain.  Baseline:  Hip Abd & Ext R/L 4/4, Hip Add R/L 4-/4- Goal status: Ongoing    3.  Patient will maintain a standing posture for >=10 min without needing a seated rest break to indicate improved activity tolerance and decrease in low back pain symptoms.  Baseline: NT  Goal status: Ongoing        PLAN: PT FREQUENCY: 1-2x/week   PT DURATION: 8 weeks   PLANNED INTERVENTIONS: Therapeutic exercises, Therapeutic activity, Neuromuscular re-education, Gait training, Patient/Family education, Joint mobilization, Joint manipulation, Stair training, DME instructions, Aquatic Therapy, Dry Needling, Electrical stimulation, Spinal manipulation, Spinal mobilization, Cryotherapy, Moist heat, Traction, Manual therapy, and Re-evaluation.   PLAN FOR NEXT SESSION: Hip strengthening and stretching exercises. Measure standing endurance    Bradly Chris PT, DPT  08/12/2022, 11:55 AM

## 2022-08-14 ENCOUNTER — Encounter: Payer: PPO | Admitting: Physical Therapy

## 2022-08-14 DIAGNOSIS — G4733 Obstructive sleep apnea (adult) (pediatric): Secondary | ICD-10-CM | POA: Diagnosis not present

## 2022-08-19 ENCOUNTER — Encounter: Payer: Self-pay | Admitting: Physical Therapy

## 2022-08-19 ENCOUNTER — Ambulatory Visit: Payer: PPO | Admitting: Physical Therapy

## 2022-08-19 DIAGNOSIS — M5459 Other low back pain: Secondary | ICD-10-CM

## 2022-08-19 NOTE — Therapy (Addendum)
OUTPATIENT PHYSICAL THERAPY TREATMENT NOTE   Patient Name: Kenneth Ross MRN: [id number] DOB:Dec 05, 1935, 86 y.o., male Today's Date: 08/19/2022  PCP: Dr.  Elsie Stain  REFERRING PROVIDER: Dr. Elsie Stain   END OF SESSION:   PT End of Session - 08/19/22 1335     Visit Number 5    Number of Visits 16    Date for PT Re-Evaluation 09/17/22    Authorization Type HTA    Progress Note Due on Visit 10    PT Start Time 1330    PT Stop Time 0109    PT Time Calculation (min) 45 min    Activity Tolerance Patient tolerated treatment well    Behavior During Therapy [facility] for tasks assessed/performed             Past Medical History:  Diagnosis Date   Allergic rhinitis, seasonal    Allergy    Bell's palsy    with L eyelid droop at baseline as of 2017   Blood transfusion without reported diagnosis    Cancer of kidney ([place])    Cataract    CKD (chronic kidney disease), stage III ([place])    followed by pcp   ED (erectile dysfunction)    GERD (gastroesophageal reflux disease)    occasional take tums   History of renal cell carcinoma    1986  s/p  left nephrectomy   Hyperlipidemia    Idiopathic scoliosis    Pneumonia    Prostate cancer [facility]) urologist-- dr dahlstedt/  oncologist-- dr [name]   dx 09-07-2019--- Stage T1c,  Gleason 4+3   Seasonal allergies    Sleep apnea    Cpap   Solitary right kidney    s/p  left nephrecotmy 1986   Tachycardia followed by pcp   HR 100 @ pcp office --- pt referred to cardiology for consult w/ dr t. Rockey Situ note in epic 12-14-2017, by pcp (dr Damita Dunnings) no work up done,  pt has lopressor as needed if heart rate is elevated as pt feels appropiate   Past Surgical History:  Procedure Laterality Date   CATARACT EXTRACTION Left 06/10/2018   Dr. Valetta Close   CATARACT EXTRACTION Right 2021   CATARACT EXTRACTION W/ INTRAOCULAR LENS IMPLANT Left    LAMINECTOMY  1993   L3-5   NEPHRECTOMY Left 10/1985   RADIOACTIVE SEED IMPLANT N/A 10/21/2019    Procedure: RADIOACTIVE SEED IMPLANT/BRACHYTHERAPY IMPLANT;  Surgeon: Franchot Gallo, MD;  Location: [place];  Service: Urology;  Laterality: N/A;  65 seeds implanted   SHOULDER OPEN ROTATOR CUFF REPAIR  12/1998   Right   SHOULDER OPEN ROTATOR CUFF REPAIR Bilateral 08/2001   SPACE OAR INSTILLATION N/A 10/21/2019   Procedure: SPACE OAR INSTILLATION;  Surgeon: Franchot Gallo, MD;  Location: [facility];  Service: Urology;  Laterality: N/A;   Patient Active Problem List   Diagnosis Date Noted   Sleep apnea    Acute renal failure superimposed on stage 3a chronic kidney disease (HCC)    Rash    H. pylori infection    Carpal tunnel syndrome 09/11/2021   Throat clearing 09/11/2021   Actinic keratosis 09/11/2021   Snoring 01/08/2020   Prostate cancer ([place]) 09/13/2019   Health care maintenance 08/31/2018   Tachycardia 10/27/2017   Idiopathic scoliosis 08/08/2015   Advance care planning 08/03/2014   Medicare annual wellness visit, subsequent 07/26/2012   Hypercholesteremia 07/24/2011   Renal insufficiency 07/24/2011   ERECTILE DYSFUNCTION, ORGANIC 05/21/2010   FOOT PAIN, RIGHT 02/18/2008  Renal cell cancer, left ([place]) 07/22/2007    REFERRING DIAG: M54.9 (ICD-10-CM) - Back pain, unspecified back location, unspecified back pain laterality, unspecified chronicity  THERAPY DIAG:  Other low back pain  Rationale for Evaluation and Treatment Rehabilitation  PERTINENT HISTORY: Patient reports that he has right levo-scoliosis for most of his life. For the past year, he has been experiencing worsening low back pain. He mostly experiences this on his right side and it increases when standing up. He has h/o of low back surgery where he had discetomy in 1993 which he reports helping significantly. For physical activity, he walks dogs 3x per day, mows lawn, and vacuums.   PRECAUTIONS: None   SUBJECTIVE: Pt reports that he is continuing to feel his low back  pain from time to time. He is not feeling as much pain today because he has not been doing that much physical activity.  PAIN:  Are you having pain? No    OBJECTIVE: (objective measures completed at initial evaluation unless otherwise dated)    VITALS: BP104/63 HR 82 SpO2 100   DIAGNOSTIC FINDINGS:  Clinical Data:  Low back pain.  Radiculopathy.  Right hip and lower extremity pain and numbness.  Status post diskectomy in 1993.  History of renal cell carcinoma with left nephrectomy in 1986.  MRI LUMBAR SPINE WITHOUT AND WITH CONTRAST:  Technique:  Multiplanar and multiecho pulse sequences of the lumbar spine, to include the lower thoracic region and upper sacral regions, were obtained according to standard protocol before and after administration of intravenous contrast.  Contrast:  13 mL Omniscan.  Comparison:  None.  Findings:  As stated, the patient is status post left nephrectomy.  Soft tissues are otherwise unremarkable.  Normal signal is present in the conus medullaris which terminates at L1-2.  Vertebral body height and alignment are maintained.  There are endplate marrow changes at L2-3 and L5-S1.  Marrow signal is otherwise within normal limits.    L1-2:  Normal.  L2-3:  There is near complete loss of disc height with some disc bulging and significant osteophyte formation.  This results in moderate left neural foraminal narrowing.  The central canal and right neural foramen are patent.  Endplate changes are seen with sclerosis and mild enhancement.  L3-4:  A broad-based disc bulge and facet hypertrophy are present.  The disc bulge contacts the right L4 nerve roots within the lateral recess.  L3 nerve roots appear to exit without contact.  The right L3 nerve root is displaced posteriorly by the disc bulge, although not contacted.  L4-5:  A broad-based disc bulge, facet hypertrophy, and ligamentum flavum thickening are present.  This results in mild central canal stenosis with impact on the  lateral recess bilaterally, right greater than left.  The disc bulge also contacts the right L4 nerve root with moderate right neural foraminal narrowing.  The left neural foramen is patent.  L5-S1:  The patient is status post laminectomy at this level.  There is grade I anterolisthesis, greater on the right than left.  Some subluxation of the right facet joints is noted.  Moderate right neural foraminal narrowing secondary to this in conjunction with osteophyte formation of the S1 vertebral body.  There may be a pars defect on the right.  No definite pars defect is seen on the left.  Sclerotic endplate changes are noted.  IMPRESSION:  1.  Moderate left neural foraminal narrowing L2-3.  2.  Moderate right neural foraminal narrowing L4-5 and L5-S1.  3.  Mild central canal stenosis with impact on the lateral recess at L4-5.  4.  Anterolisthesis greater on the right at L5-S1.   Provider: Donnella Bi, Wilford Grist   PATIENT SURVEYS:  FOTO 58/100 with target of  42   SCREENING FOR RED FLAGS: Bowel or bladder incontinence: No Spinal tumors: No Cauda equina syndrome: No Compression fracture: No Abdominal aneurysm: No   COGNITION:           Overall cognitive status: Within functional limits for tasks assessed                          SENSATION: Light touch: N/t on lateral side of right leg that radiates down from low back to ankle with some weakness in his right knee.    MUSCLE LENGTH: Hamstrings: Right 80 deg; Left 80 deg Thomas test: + bilateral   POSTURE: right pelvic obliquity   PALPATION: Right glute    LUMBAR ROM:    Active  A/PROM  eval  Flexion 100%  Extension 100%  Right lateral flexion 100%  Left lateral flexion 100%  Right rotation 100%*  Left rotation 100%   (Blank rows = not tested)   LOWER EXTREMITY ROM:            Active  Right 07/23/2022 Left 07/23/2022  Hip flexion 120 120  Hip extension 30 30  Hip abduction 45 45  Hip adduction 30 30  Hip  internal rotation 45 45  Hip external rotation 45 45  Knee flexion 135 135  Knee extension 0 0  Ankle dorsiflexion 20 20  Ankle plantarflexion 50 50  Ankle inversion 35 35  Ankle eversion 15 15   (Blank rows = not tested)        LOWER EXTREMITY MMT:     MMT Right eval Left eval  Hip flexion 5 5  Hip extension 4 4  Hip abduction 4 4  Hip adduction 4- 4-  Hip internal rotation      Hip external rotation      Knee flexion 5 5  Knee extension 5 5  Ankle dorsiflexion 5 5  Ankle plantarflexion      Ankle inversion      Ankle eversion       (Blank rows = not tested)   LUMBAR SPECIAL TESTS:  Straight leg raise test: Negative, FABER test: Negative, Thomas test: Positive, and Thigh Trust negative bilateral      GAIT: Distance walked: 40 ft  Assistive device utilized: None Level of assistance: Complete Independence Comments: No abnormalities        TODAY'S TREATMENT   08/19/22:             Nu-Step resistance level 4 with seat and arm level at 8 for 5 min              OMEGA Pallof Press with #10 lbs 3 x 10              Standing Hip Abduction with yellow band 3 x 10              Mini-Squats from 24 inch mat height with 2 x 8 lb DB 3 x 10             Standing Hip Adduction with sliders 3 x 10               Side Lying Hip Adduction 1 x 10     08/12/22:  Nu-Step level 5 for arms and feet with resistance level 4              OMEGA LE Press #65 1 x 10              OMEGA LE Press #75 2 x 10               Standing Hip Abduction 3 x 10 without UE support               Mini-Squat with #8 DB 3 x 10              Matrix Standing Hip Adduction #55 2 x 10              Matrix Standing Hip Adduction #75 1 x 10                  Lateral Trunk Leans #10 1 x 10              Lateral Trunk Leans #25 2 x 10    08/05/22              Nu-Step level 5 for arms and feet with resistance level 4               Single Leg Stance 2 x 5 x 10 sec on RLE and LLE               Matrix Hip  Abduction #25 3 x 10               Matrix Hip Adduction #40 3 x 10               Quadruped Full Range Thoracic Rotation with Reach 2 x 10               Bird Dog 1 x 10               Bird Dog 2 x 8 with #5 DB               Lat Stretch 3 x 30 sec on LLE and RLE                -min VC for increased hip rotation for increased stretch    07/29/22              Nu-Step level 5 for arms and feet              Hip Abduction with BUE support 1 x 10              Hip Abduction with 1 UE support 1 x 10              -min VC to keep toes from ER              Heel Raises without UE support 1 x 10              Heel Raises with jug of water (8 lbs) 1 x 10              Single Leg Heel Raises with BUE support 2 x 10              Single Leg Heel Raises with 1 UE 2 x 10              Seated QL stretch to left 3 x 30 sec  Mini-Squats with butt taps with BUE support 1 x 10             Initial  Lower Trunk Rotation 3 x 10  Modified Thomas Stretch 3 x 60 sec      PATIENT EDUCATION:  Education details: form and technique and explanation of plan of care   Person educated: Patient Education method: Explanation, Demonstration, Verbal cues, and Handouts Education comprehension: verbalized understanding and returned demonstration     HOME EXERCISE PROGRAM: Access Code: [email or web address] URL: [email or web address] Date: 08/19/2022 Prepared by: Bradly Chris  Exercises - Supine Lower Trunk Rotation  - 1 x daily - 3 sets - 10 reps - Modified Thomas Stretch  - 1 x daily - 3 reps - 60 hold - Standing Hip Abduction with Counter Support  - 3 x weekly - 3 sets - 10 reps - Single Leg Heel Raise with Chair Support  - 3 x weekly - 3 sets - 10 reps - Mini Squat  - 3 x weekly - 3 sets - 10 reps - Quadruped Full Range Thoracic Rotation with Reach  - 1 x daily - 2 sets - 10 reps - Bird Dog  - 3 x weekly - 3 sets - 8 reps - Latissimus Dorsi Stretch at Wall  - 3 reps - 60 hold -Standing Sliders with  Hip Adduction 3 x 10 x 3 days per week     ASSESSMENT:   CLINICAL IMPRESSION:  Pt continues to exhibit an improvement in hip strength with ability to perform hip abduction and mini-squats with an increased amount of resistance. He also shows an improved perception of his function with a slight increase in his FOTO score. He will continue to benefit from skilled PT to increase his hip mobility and strength and to decrease low back pain to walk his dog and complete yard work without an increase in his low back pain.     OBJECTIVE IMPAIRMENTS decreased strength, impaired flexibility, postural dysfunction, and pain.    ACTIVITY LIMITATIONS carrying, lifting, bending, standing, squatting, and stairs   PARTICIPATION LIMITATIONS: shopping, yard work, and church   PERSONAL FACTORS Age, Time since onset of injury/illness/exacerbation, and 1 comorbidity: Idiopathic scoliosis  are also affecting patient's functional outcome.    REHAB POTENTIAL: Good   CLINICAL DECISION MAKING: Stable/uncomplicated   EVALUATION COMPLEXITY: Low     GOALS: Goals reviewed with patient? No   SHORT TERM GOALS: Target date: 08/06/2022   Pt will be independent with HEP in order to improve strength and balance in order to decrease fall risk and improve function at home and work. Baseline: NT  Goal status: Ongoing        LONG TERM GOALS: Target date: 09/17/2022   Patient will have improved function and activity level as evidenced by an increase in FOTO score by 10 points or more.  Baseline: 58/100 08/19/22: 60/100 Goal status: Ongoing    2.  Patient will improve hip strength by 1/2 MMT to help offload spinal structures to decrease pain in lumbar spine to return to standing activities without experiencing increased pain.  Baseline:  Hip Abd & Ext R/L 4/4, Hip Add R/L 4-/4- Goal status: Ongoing    3.  Patient will maintain a standing posture for >=10 min without needing a seated rest break to indicate improved  activity tolerance and decrease in low back pain symptoms.  Baseline: NT  Goal status: Ongoing        PLAN: PT FREQUENCY: 1-2x/week   PT DURATION:  8 weeks   PLANNED INTERVENTIONS: Therapeutic exercises, Therapeutic activity, Neuromuscular re-education, Gait training, Patient/Family education, Joint mobilization, Joint manipulation, Stair training, DME instructions, Aquatic Therapy, Dry Needling, Electrical stimulation, Spinal manipulation, Spinal mobilization, Cryotherapy, Moist heat, Traction, Manual therapy, and Re-evaluation.   PLAN FOR NEXT SESSION: Hip strengthening and stretching exercises. Measure standing endurance   Bradly Chris PT, DPT  08/19/2022, 1:36 PM

## 2022-08-21 ENCOUNTER — Encounter: Payer: PPO | Admitting: Physical Therapy

## 2022-08-26 ENCOUNTER — Encounter: Payer: Self-pay | Admitting: Physical Therapy

## 2022-08-26 ENCOUNTER — Ambulatory Visit: Payer: PPO | Admitting: Physical Therapy

## 2022-08-26 DIAGNOSIS — M5459 Other low back pain: Secondary | ICD-10-CM

## 2022-08-26 NOTE — Therapy (Signed)
OUTPATIENT PHYSICAL THERAPY TREATMENT NOTE   Patient Name: Kenneth Ross MRN: [id number] DOB:1936/05/28, 86 y.o., male Today's Date: 08/26/2022  PCP: Dr.  Elsie Stain  REFERRING PROVIDER: Dr. Elsie Stain   END OF SESSION:   PT End of Session - 08/26/22 1507     Visit Number 5    Number of Visits 16    Date for PT Re-Evaluation 09/17/22    Authorization Type HTA    PT Start Time 0932    PT Stop Time 3557    PT Time Calculation (min) 40 min    Activity Tolerance Patient tolerated treatment well    Behavior During Therapy [facility] for tasks assessed/performed             Past Medical History:  Diagnosis Date   Allergic rhinitis, seasonal    Allergy    Bell's palsy    with L eyelid droop at baseline as of 2017   Blood transfusion without reported diagnosis    Cancer of kidney ([place])    Cataract    CKD (chronic kidney disease), stage III ([place])    followed by pcp   ED (erectile dysfunction)    GERD (gastroesophageal reflux disease)    occasional take tums   History of renal cell carcinoma    1986  s/p  left nephrectomy   Hyperlipidemia    Idiopathic scoliosis    Pneumonia    Prostate cancer [facility]) urologist-- dr dahlstedt/  oncologist-- dr [name]   dx 09-07-2019--- Stage T1c,  Gleason 4+3   Seasonal allergies    Sleep apnea    Cpap   Solitary right kidney    s/p  left nephrecotmy 1986   Tachycardia followed by pcp   HR 100 @ pcp office --- pt referred to cardiology for consult w/ dr t. Rockey Situ note in epic 12-14-2017, by pcp (dr Damita Dunnings) no work up done,  pt has lopressor as needed if heart rate is elevated as pt feels appropiate   Past Surgical History:  Procedure Laterality Date   CATARACT EXTRACTION Left 06/10/2018   Dr. Valetta Close   CATARACT EXTRACTION Right 2021   CATARACT EXTRACTION W/ INTRAOCULAR LENS IMPLANT Left    LAMINECTOMY  1993   L3-5   NEPHRECTOMY Left 10/1985   RADIOACTIVE SEED IMPLANT N/A 10/21/2019   Procedure: RADIOACTIVE SEED  IMPLANT/BRACHYTHERAPY IMPLANT;  Surgeon: Franchot Gallo, MD;  Location: [place];  Service: Urology;  Laterality: N/A;  65 seeds implanted   SHOULDER OPEN ROTATOR CUFF REPAIR  12/1998   Right   SHOULDER OPEN ROTATOR CUFF REPAIR Bilateral 08/2001   SPACE OAR INSTILLATION N/A 10/21/2019   Procedure: SPACE OAR INSTILLATION;  Surgeon: Franchot Gallo, MD;  Location: [facility];  Service: Urology;  Laterality: N/A;   Patient Active Problem List   Diagnosis Date Noted   Sleep apnea    Acute renal failure superimposed on stage 3a chronic kidney disease (HCC)    Rash    H. pylori infection    Carpal tunnel syndrome 09/11/2021   Throat clearing 09/11/2021   Actinic keratosis 09/11/2021   Snoring 01/08/2020   Prostate cancer ([place]) 09/13/2019   Health care maintenance 08/31/2018   Tachycardia 10/27/2017   Idiopathic scoliosis 08/08/2015   Advance care planning 08/03/2014   Medicare annual wellness visit, subsequent 07/26/2012   Hypercholesteremia 07/24/2011   Renal insufficiency 07/24/2011   ERECTILE DYSFUNCTION, ORGANIC 05/21/2010   FOOT PAIN, RIGHT 02/18/2008   Renal cell cancer, left ([place]) 07/22/2007  REFERRING DIAG: M54.9 (ICD-10-CM) - Back pain, unspecified back location, unspecified back pain laterality, unspecified chronicity  THERAPY DIAG:  Other low back pain  Rationale for Evaluation and Treatment Rehabilitation  PERTINENT HISTORY: Patient reports that he has right levo-scoliosis for most of his life. For the past year, he has been experiencing worsening low back pain. He mostly experiences this on his right side and it increases when standing up. He has h/o of low back surgery where he had discetomy in 1993 which he reports helping significantly. For physical activity, he walks dogs 3x per day, mows lawn, and vacuums.   PRECAUTIONS: None   SUBJECTIVE: Pt reports still feeling pain when standing for a long period of time but that his  pain is improving or it does not feel as bad when he stands for a long time as it use too.   PAIN:  Are you having pain? No    OBJECTIVE: (objective measures completed at initial evaluation unless otherwise dated)    VITALS: BP104/63 HR 82 SpO2 100   DIAGNOSTIC FINDINGS:  Clinical Data:  Low back pain.  Radiculopathy.  Right hip and lower extremity pain and numbness.  Status post diskectomy in 1993.  History of renal cell carcinoma with left nephrectomy in 1986.  MRI LUMBAR SPINE WITHOUT AND WITH CONTRAST:  Technique:  Multiplanar and multiecho pulse sequences of the lumbar spine, to include the lower thoracic region and upper sacral regions, were obtained according to standard protocol before and after administration of intravenous contrast.  Contrast:  13 mL Omniscan.  Comparison:  None.  Findings:  As stated, the patient is status post left nephrectomy.  Soft tissues are otherwise unremarkable.  Normal signal is present in the conus medullaris which terminates at L1-2.  Vertebral body height and alignment are maintained.  There are endplate marrow changes at L2-3 and L5-S1.  Marrow signal is otherwise within normal limits.    L1-2:  Normal.  L2-3:  There is near complete loss of disc height with some disc bulging and significant osteophyte formation.  This results in moderate left neural foraminal narrowing.  The central canal and right neural foramen are patent.  Endplate changes are seen with sclerosis and mild enhancement.  L3-4:  A broad-based disc bulge and facet hypertrophy are present.  The disc bulge contacts the right L4 nerve roots within the lateral recess.  L3 nerve roots appear to exit without contact.  The right L3 nerve root is displaced posteriorly by the disc bulge, although not contacted.  L4-5:  A broad-based disc bulge, facet hypertrophy, and ligamentum flavum thickening are present.  This results in mild central canal stenosis with impact on the lateral recess bilaterally,  right greater than left.  The disc bulge also contacts the right L4 nerve root with moderate right neural foraminal narrowing.  The left neural foramen is patent.  L5-S1:  The patient is status post laminectomy at this level.  There is grade I anterolisthesis, greater on the right than left.  Some subluxation of the right facet joints is noted.  Moderate right neural foraminal narrowing secondary to this in conjunction with osteophyte formation of the S1 vertebral body.  There may be a pars defect on the right.  No definite pars defect is seen on the left.  Sclerotic endplate changes are noted.  IMPRESSION:  1.  Moderate left neural foraminal narrowing L2-3.  2.  Moderate right neural foraminal narrowing L4-5 and L5-S1.  3.  Mild central canal stenosis  with impact on the lateral recess at L4-5.  4.  Anterolisthesis greater on the right at L5-S1.   Provider: Donnella Bi, Wilford Grist   PATIENT SURVEYS:  FOTO 58/100 with target of  67  08/26/22: 63/100 with target of 32   SCREENING FOR RED FLAGS: Bowel or bladder incontinence: No Spinal tumors: No Cauda equina syndrome: No Compression fracture: No Abdominal aneurysm: No   COGNITION:           Overall cognitive status: Within functional limits for tasks assessed                          SENSATION: Light touch: N/t on lateral side of right leg that radiates down from low back to ankle with some weakness in his right knee.    MUSCLE LENGTH: Hamstrings: Right 80 deg; Left 80 deg Thomas test: + bilateral   POSTURE: right pelvic obliquity   PALPATION: Right glute    LUMBAR ROM:    Active  A/PROM  eval  Flexion 100%  Extension 100%  Right lateral flexion 100%  Left lateral flexion 100%  Right rotation 100%*  Left rotation 100%   (Blank rows = not tested)   LOWER EXTREMITY ROM:            Active  Right 07/23/2022 Left 07/23/2022  Hip flexion 120 120  Hip extension 30 30  Hip abduction 45 45  Hip adduction 30 30   Hip internal rotation 45 45  Hip external rotation 45 45  Knee flexion 135 135  Knee extension 0 0  Ankle dorsiflexion 20 20  Ankle plantarflexion 50 50  Ankle inversion 35 35  Ankle eversion 15 15   (Blank rows = not tested)        LOWER EXTREMITY MMT:     MMT Right eval Left eval  Hip flexion 5 5  Hip extension 4 4  Hip abduction 4 4  Hip adduction 4- 4-  Hip internal rotation      Hip external rotation      Knee flexion 5 5  Knee extension 5 5  Ankle dorsiflexion 5 5  Ankle plantarflexion      Ankle inversion      Ankle eversion       (Blank rows = not tested)   LUMBAR SPECIAL TESTS:  Straight leg raise test: Negative, FABER test: Negative, Thomas test: Positive, and Thigh Trust negative bilateral      GAIT: Distance walked: 40 ft  Assistive device utilized: None Level of assistance: Complete Independence Comments: No abnormalities        TODAY'S TREATMENT   08/26/22             Nu-Step resistance level 3 with seat and arm level at 8 for 5 min              [place] Press with Black TB 2 x 10              -Pt reports feeling that it is working his arms more than his abdominals              Boat Pose with abdominal rotation with #5 DB 1 x 10              Supine Crunches 3 x 10               Standing HS Curls 1 x 10  Standing HS Curls with Red band 2 x 10               -Use one hand with right foot and no hands with left foot              OMEGA Hip Adduction #70 3 x 10 on setting of 6 for arm    08/19/22:             Nu-Step resistance level 4 with seat and arm level at 8 for 5 min              [place] with #10 lbs 3 x 10              Standing Hip Abduction with yellow band 3 x 10              Mini-Squats from 24 inch mat height with 2 x 8 lb DB 3 x 10             Standing Hip Adduction with sliders 3 x 10               Side Lying Hip Adduction 1 x 10     08/12/22:             Nu-Step level 5 for arms and feet with resistance  level 4              OMEGA LE Press #65 1 x 10              OMEGA LE Press #75 2 x 10               Standing Hip Abduction 3 x 10 without UE support               Mini-Squat with #8 DB 3 x 10              Matrix Standing Hip Adduction #55 2 x 10              Matrix Standing Hip Adduction #75 1 x 10                  Lateral Trunk Leans #10 1 x 10              Lateral Trunk Leans #25 2 x 10      PATIENT EDUCATION:  Education details: form and technique and explanation of plan of care   Person educated: Patient Education method: Explanation, Demonstration, Verbal cues, and Handouts Education comprehension: verbalized understanding and returned demonstration     HOME EXERCISE PROGRAM: Access Code: [email or web address] URL: [email or web address] Date: 08/26/2022 Prepared by: Bradly Chris  Exercises - Supine Lower Trunk Rotation  - 1 x daily - 3 sets - 10 reps - Modified Thomas Stretch  - 1 x daily - 3 reps - 60 hold - Standing Hip Abduction with Counter Support  - 3 x weekly - 3 sets - 10 reps - Single Leg Heel Raise with Chair Support  - 3 x weekly - 3 sets - 10 reps - Mini Squat  - 3 x weekly - 3 sets - 10 reps - Quadruped Full Range Thoracic Rotation with Reach  - 1 x daily - 2 sets - 10 reps - Bird Dog  - 3 x weekly - 3 sets - 8 reps - Latissimus Dorsi Stretch at Wall  - 3 reps - 60 hold - Ab Prep  - 1 x daily - 3  x weekly - 3 sets - 10 reps   ASSESSMENT:   CLINICAL IMPRESSION:  Pt shows improvement in hip strength with ability to perform hip adduction and hs curl with increased resistance and increased self-perception of function. He will continue to benefit from skilled PT to increase his hip mobility and strength and to decrease low back pain to walk his dog and complete yard work without an increase in his low back pain.    OBJECTIVE IMPAIRMENTS decreased strength, impaired flexibility, postural dysfunction, and pain.    ACTIVITY LIMITATIONS carrying, lifting,  bending, standing, squatting, and stairs   PARTICIPATION LIMITATIONS: shopping, yard work, and church   PERSONAL FACTORS Age, Time since onset of injury/illness/exacerbation, and 1 comorbidity: Idiopathic scoliosis  are also affecting patient's functional outcome.    REHAB POTENTIAL: Good   CLINICAL DECISION MAKING: Stable/uncomplicated   EVALUATION COMPLEXITY: Low     GOALS: Goals reviewed with patient? No   SHORT TERM GOALS: Target date: 08/06/2022   Pt will be independent with HEP in order to improve strength and balance in order to decrease fall risk and improve function at home and work. Baseline: NT  Goal status: Ongoing        LONG TERM GOALS: Target date: 09/17/2022   Patient will have improved function and activity level as evidenced by an increase in FOTO score by 10 points or more.  Baseline: 58/100 08/19/22: 60/100  08/26/22: 63/100 Goal status: Ongoing    2.  Patient will improve hip strength by 1/2 MMT to help offload spinal structures to decrease pain in lumbar spine to return to standing activities without experiencing increased pain.  Baseline:  Hip Abd & Ext R/L 4/4, Hip Add R/L 4-/4- Goal status: Ongoing    3.  Patient will maintain a standing posture for >=10 min without needing a seated rest break to indicate improved activity tolerance and decrease in low back pain symptoms.  Baseline: NT  Goal status: Ongoing        PLAN: PT FREQUENCY: 1-2x/week   PT DURATION: 8 weeks   PLANNED INTERVENTIONS: Therapeutic exercises, Therapeutic activity, Neuromuscular re-education, Gait training, Patient/Family education, Joint mobilization, Joint manipulation, Stair training, DME instructions, Aquatic Therapy, Dry Needling, Electrical stimulation, Spinal manipulation, Spinal mobilization, Cryotherapy, Moist heat, Traction, Manual therapy, and Re-evaluation.   PLAN FOR NEXT SESSION: Perform all of session in standing. Reassess hip strength. Progress hip extension and  abdominal exercises.   Bradly Chris PT, DPT  08/26/2022, 3:08 PM

## 2022-08-28 ENCOUNTER — Encounter: Payer: PPO | Admitting: Physical Therapy

## 2022-08-31 ENCOUNTER — Other Ambulatory Visit: Payer: Self-pay | Admitting: Family Medicine

## 2022-08-31 DIAGNOSIS — E7800 Pure hypercholesterolemia, unspecified: Secondary | ICD-10-CM

## 2022-09-01 ENCOUNTER — Encounter: Payer: Self-pay | Admitting: Physical Therapy

## 2022-09-01 ENCOUNTER — Ambulatory Visit: Payer: PPO | Attending: Family Medicine | Admitting: Physical Therapy

## 2022-09-01 DIAGNOSIS — M5459 Other low back pain: Secondary | ICD-10-CM | POA: Insufficient documentation

## 2022-09-01 NOTE — Therapy (Signed)
OUTPATIENT PHYSICAL THERAPY TREATMENT NOTE   Patient Name: Kenneth Ross MRN: [id number] DOB:[birth year], 86 y.o., male Today's Date: 09/01/2022  PCP: Dr.  Elsie Stain  REFERRING PROVIDER: Dr. Elsie Stain   END OF SESSION:   PT End of Session - 09/01/22 1152     Visit Number 7    Number of Visits 16    Date for PT Re-Evaluation 09/17/22    Authorization Type HTA    PT Start Time 8416    PT Stop Time 1230    PT Time Calculation (min) 45 min    Activity Tolerance Patient tolerated treatment well    Behavior During Therapy [facility] for tasks assessed/performed             Past Medical History:  Diagnosis Date   Allergic rhinitis, seasonal    Allergy    Bell's palsy    with L eyelid droop at baseline as of 2017   Blood transfusion without reported diagnosis    Cancer of kidney ([place])    Cataract    CKD (chronic kidney disease), stage III ([place])    followed by pcp   ED (erectile dysfunction)    GERD (gastroesophageal reflux disease)    occasional take tums   History of renal cell carcinoma    1986  s/p  left nephrectomy   Hyperlipidemia    Idiopathic scoliosis    Pneumonia    Prostate cancer [facility]) urologist-- dr dahlstedt/  oncologist-- dr [name]   dx 09-07-2019--- Stage T1c,  Gleason 4+3   Seasonal allergies    Sleep apnea    Cpap   Solitary right kidney    s/p  left nephrecotmy 1986   Tachycardia followed by pcp   HR 100 @ pcp office --- pt referred to cardiology for consult w/ dr t. Rockey Situ note in epic 12-14-2017, by pcp (dr Damita Dunnings) no work up done,  pt has lopressor as needed if heart rate is elevated as pt feels appropiate   Past Surgical History:  Procedure Laterality Date   CATARACT EXTRACTION Left 06/10/2018   Dr. Valetta Close   CATARACT EXTRACTION Right 2021   CATARACT EXTRACTION W/ INTRAOCULAR LENS IMPLANT Left    LAMINECTOMY  1993   L3-5   NEPHRECTOMY Left 10/1985   RADIOACTIVE SEED IMPLANT N/A 10/21/2019   Procedure: RADIOACTIVE SEED  IMPLANT/BRACHYTHERAPY IMPLANT;  Surgeon: Franchot Gallo, MD;  Location: [place];  Service: Urology;  Laterality: N/A;  65 seeds implanted   SHOULDER OPEN ROTATOR CUFF REPAIR  12/1998   Right   SHOULDER OPEN ROTATOR CUFF REPAIR Bilateral 08/2001   SPACE OAR INSTILLATION N/A 10/21/2019   Procedure: SPACE OAR INSTILLATION;  Surgeon: Franchot Gallo, MD;  Location: [facility];  Service: Urology;  Laterality: N/A;   Patient Active Problem List   Diagnosis Date Noted   Sleep apnea    Acute renal failure superimposed on stage 3a chronic kidney disease (HCC)    Rash    H. pylori infection    Carpal tunnel syndrome 09/11/2021   Throat clearing 09/11/2021   Actinic keratosis 09/11/2021   Snoring 01/08/2020   Prostate cancer ([place]) 09/13/2019   Health care maintenance 08/31/2018   Tachycardia 10/27/2017   Idiopathic scoliosis 08/08/2015   Advance care planning 08/03/2014   Medicare annual wellness visit, subsequent 07/26/2012   Hypercholesteremia 07/24/2011   Renal insufficiency 07/24/2011   ERECTILE DYSFUNCTION, ORGANIC 05/21/2010   FOOT PAIN, RIGHT 02/18/2008   Renal cell cancer, left ([place]) 07/22/2007  REFERRING DIAG: M54.9 (ICD-10-CM) - Back pain, unspecified back location, unspecified back pain laterality, unspecified chronicity  THERAPY DIAG:  Other low back pain  Rationale for Evaluation and Treatment Rehabilitation  PERTINENT HISTORY: Patient reports that he has right levo-scoliosis for most of his life. For the past year, he has been experiencing worsening low back pain. He mostly experiences this on his right side and it increases when standing up. He has h/o of low back surgery where he had discetomy in 1993 which he reports helping significantly. For physical activity, he walks dogs 3x per day, mows lawn, and vacuums.   PRECAUTIONS: None   SUBJECTIVE: Pt reports continuing to have low back pain on right side. He has been able to  complete exercises without much difficulty.  PAIN:  Are you having pain? No    OBJECTIVE: (objective measures completed at initial evaluation unless otherwise dated)    VITALS: BP104/63 HR 82 SpO2 100   DIAGNOSTIC FINDINGS:  Clinical Data:  Low back pain.  Radiculopathy.  Right hip and lower extremity pain and numbness.  Status post diskectomy in 1993.  History of renal cell carcinoma with left nephrectomy in 1986.  MRI LUMBAR SPINE WITHOUT AND WITH CONTRAST:  Technique:  Multiplanar and multiecho pulse sequences of the lumbar spine, to include the lower thoracic region and upper sacral regions, were obtained according to standard protocol before and after administration of intravenous contrast.  Contrast:  13 mL Omniscan.  Comparison:  None.  Findings:  As stated, the patient is status post left nephrectomy.  Soft tissues are otherwise unremarkable.  Normal signal is present in the conus medullaris which terminates at L1-2.  Vertebral body height and alignment are maintained.  There are endplate marrow changes at L2-3 and L5-S1.  Marrow signal is otherwise within normal limits.    L1-2:  Normal.  L2-3:  There is near complete loss of disc height with some disc bulging and significant osteophyte formation.  This results in moderate left neural foraminal narrowing.  The central canal and right neural foramen are patent.  Endplate changes are seen with sclerosis and mild enhancement.  L3-4:  A broad-based disc bulge and facet hypertrophy are present.  The disc bulge contacts the right L4 nerve roots within the lateral recess.  L3 nerve roots appear to exit without contact.  The right L3 nerve root is displaced posteriorly by the disc bulge, although not contacted.  L4-5:  A broad-based disc bulge, facet hypertrophy, and ligamentum flavum thickening are present.  This results in mild central canal stenosis with impact on the lateral recess bilaterally, right greater than left.  The disc bulge also  contacts the right L4 nerve root with moderate right neural foraminal narrowing.  The left neural foramen is patent.  L5-S1:  The patient is status post laminectomy at this level.  There is grade I anterolisthesis, greater on the right than left.  Some subluxation of the right facet joints is noted.  Moderate right neural foraminal narrowing secondary to this in conjunction with osteophyte formation of the S1 vertebral body.  There may be a pars defect on the right.  No definite pars defect is seen on the left.  Sclerotic endplate changes are noted.  IMPRESSION:  1.  Moderate left neural foraminal narrowing L2-3.  2.  Moderate right neural foraminal narrowing L4-5 and L5-S1.  3.  Mild central canal stenosis with impact on the lateral recess at L4-5.  4.  Anterolisthesis greater on the right at  L5-S1.   Provider: Donnella Bi, Wilford Grist   PATIENT SURVEYS:  FOTO 58/100 with target of  67  08/26/22: 63/100 with target of 29   SCREENING FOR RED FLAGS: Bowel or bladder incontinence: No Spinal tumors: No Cauda equina syndrome: No Compression fracture: No Abdominal aneurysm: No   COGNITION:           Overall cognitive status: Within functional limits for tasks assessed                          SENSATION: Light touch: N/t on lateral side of right leg that radiates down from low back to ankle with some weakness in his right knee.    MUSCLE LENGTH: Hamstrings: Right 80 deg; Left 80 deg Thomas test: + bilateral   POSTURE: right pelvic obliquity   PALPATION: Right glute    LUMBAR ROM:    Active  A/PROM  eval  Flexion 100%  Extension 100%  Right lateral flexion 100%  Left lateral flexion 100%  Right rotation 100%*  Left rotation 100%   (Blank rows = not tested)   LOWER EXTREMITY ROM:            Active  Right 07/23/2022 Left 07/23/2022  Hip flexion 120 120  Hip extension 30 30  Hip abduction 45 45  Hip adduction 30 30  Hip internal rotation 45 45  Hip external  rotation 45 45  Knee flexion 135 135  Knee extension 0 0  Ankle dorsiflexion 20 20  Ankle plantarflexion 50 50  Ankle inversion 35 35  Ankle eversion 15 15   (Blank rows = not tested)        LOWER EXTREMITY MMT:     MMT Right eval Left eval Right  09/01/22 Left  09/01/22  Hip flexion 5 5    Hip extension 4 4 4+ 4+  Hip abduction 4 4 4+ 4  Hip adduction 4- 4- 4+ 4+  Hip internal rotation        Hip external rotation        Knee flexion 5 5    Knee extension 5 5    Ankle dorsiflexion 5 5    Ankle plantarflexion        Ankle inversion        Ankle eversion         (Blank rows = not tested)   LUMBAR SPECIAL TESTS:  Straight leg raise test: Negative, FABER test: Negative, Thomas test: Positive, and Thigh Trust negative bilateral      GAIT: Distance walked: 40 ft  Assistive device utilized: None Level of assistance: Complete Independence Comments: No abnormalities        TODAY'S TREATMENT   09/01/22              Nu-Step resistance level 3 with seat and arm level at 8 for 5 min              See MMT listed above               Left Hip Abduction in Side Lying 3 x 10                -mod TC to keep hips from rolling forward               Single Leg Figure 4 Bridge 3 x 10               Side Lying Hip  Adduction 3 x 10   08/26/22             Nu-Step resistance level 3 with seat and arm level at 8 for 5 min              [place] Press with Black TB 2 x 10              -Pt reports feeling that it is working his arms more than his abdominals              Boat Pose with abdominal rotation with #5 DB 1 x 10              Supine Crunches 3 x 10               Standing HS Curls 1 x 10              Standing HS Curls with Red band 2 x 10               -Use one hand with right foot and no hands with left foot              OMEGA Hip Adduction #70 3 x 10 on setting of 6 for arm    08/19/22:             Nu-Step resistance level 4 with seat and arm level at 8 for 5 min               [place] with #10 lbs 3 x 10              Standing Hip Abduction with yellow band 3 x 10              Mini-Squats from 24 inch mat height with 2 x 8 lb DB 3 x 10             Standing Hip Adduction with sliders 3 x 10               Side Lying Hip Adduction 1 x 10     08/12/22:             Nu-Step level 5 for arms and feet with resistance level 4              OMEGA LE Press #65 1 x 10              OMEGA LE Press #75 2 x 10               Standing Hip Abduction 3 x 10 without UE support               Mini-Squat with #8 DB 3 x 10              Matrix Standing Hip Adduction #55 2 x 10              Matrix Standing Hip Adduction #75 1 x 10                  Lateral Trunk Leans #10 1 x 10              Lateral Trunk Leans #25 2 x 10      PATIENT EDUCATION:  Education details: form and technique and explanation of plan of care   Person educated: Patient Education method: Explanation, Demonstration, Verbal cues, and Handouts Education comprehension: verbalized understanding and returned demonstration  HOME EXERCISE PROGRAM: Access Code: [email or web address] URL: [email or web address] Date: 09/01/2022 Prepared by: Bradly Chris  Exercises - Supine Lower Trunk Rotation  - 1 x daily - 3 sets - 10 reps - Modified Thomas Stretch  - 1 x daily - 3 reps - 60 hold - Single Leg Heel Raise with Chair Support  - 3 x weekly - 3 sets - 10 reps - Quadruped Full Range Thoracic Rotation with Reach  - 1 x daily - 2 sets - 10 reps - Bird Dog  - 3 x weekly - 3 sets - 8 reps - Latissimus Dorsi Stretch at Wall  - 3 reps - 60 hold - Ab Prep  - 1 x daily - 3 x weekly - 3 sets - 10 reps - Sidelying Hip Abduction  - 1 x daily - 3 x weekly - 3 sets - 10 reps - Figure 4 Bridge  - 1 x daily - 3 x weekly - 3 sets - 10 reps - Sidelying Hip Adduction  - 1 x daily - 3 x weekly - 3 sets - 10 reps  ASSESSMENT:   CLINICAL IMPRESSION:  Pt exhibits an improvement in hip strength with the exception of the  left hip abduction, which is likely the result of pelvic rotation from scoliosis. He requires mod TC to adjust hips for hip abduction to avoid forward pelvic rotation. He was able to perform all exercises without an increase an his low back pain. He will continue to benefit from skilled PT to increase his hip mobility and strength and to decrease low back pain to walk his dog and complete yard work without an increase in his low back pain.    OBJECTIVE IMPAIRMENTS decreased strength, impaired flexibility, postural dysfunction, and pain.    ACTIVITY LIMITATIONS carrying, lifting, bending, standing, squatting, and stairs   PARTICIPATION LIMITATIONS: shopping, yard work, and church   PERSONAL FACTORS Age, Time since onset of injury/illness/exacerbation, and 1 comorbidity: Idiopathic scoliosis  are also affecting patient's functional outcome.    REHAB POTENTIAL: Good   CLINICAL DECISION MAKING: Stable/uncomplicated   EVALUATION COMPLEXITY: Low     GOALS: Goals reviewed with patient? No   SHORT TERM GOALS: Target date: 08/06/2022   Pt will be independent with HEP in order to improve strength and balance in order to decrease fall risk and improve function at home and work. Baseline: NT  Goal status: Ongoing        LONG TERM GOALS: Target date: 09/17/2022   Patient will have improved function and activity level as evidenced by an increase in FOTO score by 10 points or more.  Baseline: 58/100 08/19/22: 60/100  08/26/22: 63/100 Goal status: Ongoing    2.  Patient will improve hip strength by 1/2 MMT to help offload spinal structures to decrease pain in lumbar spine to return to standing activities without experiencing increased pain.  Baseline:  Hip Abd & Ext R/L 4/4, Hip Add R/L 4-/4- Goal status: Ongoing    3.  Patient will maintain a standing posture for >=10 min without needing a seated rest break to indicate improved activity tolerance and decrease in low back pain symptoms.  Baseline:  NT  Goal status: Ongoing        PLAN: PT FREQUENCY: 1-2x/week   PT DURATION: 8 weeks   PLANNED INTERVENTIONS: Therapeutic exercises, Therapeutic activity, Neuromuscular re-education, Gait training, Patient/Family education, Joint mobilization, Joint manipulation, Stair training, DME instructions, Aquatic Therapy, Dry Needling, Electrical stimulation, Spinal manipulation, Spinal mobilization, Cryotherapy, Moist heat,  Traction, Manual therapy, and Re-evaluation.   PLAN FOR NEXT SESSION: Perform all of session in standing. Progress hip extension and abdominal exercises.   Bradly Chris PT, DPT  09/01/2022, 11:53 AM

## 2022-09-03 ENCOUNTER — Telehealth: Payer: Self-pay | Admitting: Physical Therapy

## 2022-09-03 ENCOUNTER — Ambulatory Visit: Payer: PPO | Admitting: Physical Therapy

## 2022-09-03 NOTE — Telephone Encounter (Signed)
Call pt to inquire about increase in frequency of apts. Pt said that schedule showed increase and that he did not know why. PT advised pt that frequency would remain at 1x per week and that he should plan to cancel today's apt and Thursday's apt.

## 2022-09-05 ENCOUNTER — Other Ambulatory Visit (INDEPENDENT_AMBULATORY_CARE_PROVIDER_SITE_OTHER): Payer: PPO

## 2022-09-05 DIAGNOSIS — E7800 Pure hypercholesterolemia, unspecified: Secondary | ICD-10-CM

## 2022-09-05 LAB — COMPREHENSIVE METABOLIC PANEL
ALT: 24 U/L (ref 0–53)
AST: 26 U/L (ref 0–37)
Albumin: 4.7 g/dL (ref 3.5–5.2)
Alkaline Phosphatase: 51 U/L (ref 39–117)
BUN: 28 mg/dL — ABNORMAL HIGH (ref 6–23)
CO2: 29 mEq/L (ref 19–32)
Calcium: 9.5 mg/dL (ref 8.4–10.5)
Chloride: 104 mEq/L (ref 96–112)
Creatinine, Ser: 1.73 mg/dL — ABNORMAL HIGH (ref 0.40–1.50)
GFR: 35.98 mL/min — ABNORMAL LOW (ref >60.00)
Glucose, Bld: 94 mg/dL (ref 70–99)
Potassium: 4.3 mEq/L (ref 3.5–5.1)
Sodium: 140 mEq/L (ref 135–145)
Total Bilirubin: 0.7 mg/dL (ref 0.2–1.2)
Total Protein: 6.9 g/dL (ref 6.0–8.3)

## 2022-09-05 LAB — LIPID PANEL
Cholesterol: 160 mg/dL (ref 0–200)
HDL: 37.6 mg/dL — ABNORMAL LOW (ref >39.00)
LDL Cholesterol: 100 mg/dL — ABNORMAL HIGH (ref 0–99)
NonHDL: 122.39
Total CHOL/HDL Ratio: 4
Triglycerides: 114 mg/dL (ref 0.0–149.0)
VLDL: 22.8 mg/dL (ref 0.0–40.0)

## 2022-09-08 ENCOUNTER — Encounter: Payer: Self-pay | Admitting: Physical Therapy

## 2022-09-08 ENCOUNTER — Ambulatory Visit: Payer: PPO | Admitting: Physical Therapy

## 2022-09-08 DIAGNOSIS — M5459 Other low back pain: Secondary | ICD-10-CM

## 2022-09-08 NOTE — Therapy (Signed)
OUTPATIENT PHYSICAL THERAPY TREATMENT NOTE   Patient Name: Kenneth Ross MRN: [id number] DOB:[birth year], 86 y.o., male Today's Date: 09/08/2022  PCP: Dr.  Elsie Stain  REFERRING PROVIDER: Dr. Elsie Stain   END OF SESSION:   PT End of Session - 09/08/22 0816     Visit Number 8    Number of Visits 16    Date for PT Re-Evaluation 09/17/22    Authorization Type HTA    PT Start Time 0810    PT Stop Time 0850    PT Time Calculation (min) 40 min    Activity Tolerance Patient tolerated treatment well    Behavior During Therapy [facility] for tasks assessed/performed             Past Medical History:  Diagnosis Date   Allergic rhinitis, seasonal    Allergy    Bell's palsy    with L eyelid droop at baseline as of 2017   Blood transfusion without reported diagnosis    Cancer of kidney ([place])    Cataract    CKD (chronic kidney disease), stage III ([place])    followed by pcp   ED (erectile dysfunction)    GERD (gastroesophageal reflux disease)    occasional take tums   History of renal cell carcinoma    1986  s/p  left nephrectomy   Hyperlipidemia    Idiopathic scoliosis    Pneumonia    Prostate cancer [facility]) urologist-- dr dahlstedt/  oncologist-- dr [name]   dx 09-07-2019--- Stage T1c,  Gleason 4+3   Seasonal allergies    Sleep apnea    Cpap   Solitary right kidney    s/p  left nephrecotmy 1986   Tachycardia followed by pcp   HR 100 @ pcp office --- pt referred to cardiology for consult w/ dr t. Rockey Situ note in epic 12-14-2017, by pcp (dr Damita Dunnings) no work up done,  pt has lopressor as needed if heart rate is elevated as pt feels appropiate   Past Surgical History:  Procedure Laterality Date   CATARACT EXTRACTION Left 06/10/2018   Dr. Valetta Close   CATARACT EXTRACTION Right 2021   CATARACT EXTRACTION W/ INTRAOCULAR LENS IMPLANT Left    LAMINECTOMY  1993   L3-5   NEPHRECTOMY Left 10/1985   RADIOACTIVE SEED IMPLANT N/A 10/21/2019   Procedure: RADIOACTIVE SEED  IMPLANT/BRACHYTHERAPY IMPLANT;  Surgeon: Franchot Gallo, MD;  Location: [place];  Service: Urology;  Laterality: N/A;  65 seeds implanted   SHOULDER OPEN ROTATOR CUFF REPAIR  12/1998   Right   SHOULDER OPEN ROTATOR CUFF REPAIR Bilateral 08/2001   SPACE OAR INSTILLATION N/A 10/21/2019   Procedure: SPACE OAR INSTILLATION;  Surgeon: Franchot Gallo, MD;  Location: [facility];  Service: Urology;  Laterality: N/A;   Patient Active Problem List   Diagnosis Date Noted   Sleep apnea    Acute renal failure superimposed on stage 3a chronic kidney disease (HCC)    Rash    H. pylori infection    Carpal tunnel syndrome 09/11/2021   Throat clearing 09/11/2021   Actinic keratosis 09/11/2021   Snoring 01/08/2020   Prostate cancer ([place]) 09/13/2019   Health care maintenance 08/31/2018   Tachycardia 10/27/2017   Idiopathic scoliosis 08/08/2015   Advance care planning 08/03/2014   Medicare annual wellness visit, subsequent 07/26/2012   Hypercholesteremia 07/24/2011   Renal insufficiency 07/24/2011   ERECTILE DYSFUNCTION, ORGANIC 05/21/2010   FOOT PAIN, RIGHT 02/18/2008   Renal cell cancer, left ([place]) 07/22/2007  REFERRING DIAG: M54.9 (ICD-10-CM) - Back pain, unspecified back location, unspecified back pain laterality, unspecified chronicity  THERAPY DIAG:  Other low back pain  Rationale for Evaluation and Treatment Rehabilitation  PERTINENT HISTORY: Patient reports that he has right levo-scoliosis for most of his life. For the past year, he has been experiencing worsening low back pain. He mostly experiences this on his right side and it increases when standing up. He has h/o of low back surgery where he had discetomy in 1993 which he reports helping significantly. For physical activity, he walks dogs 3x per day, mows lawn, and vacuums.   PRECAUTIONS: None   SUBJECTIVE: Pt reports that pain continues to come and go. He says he can stand better most of  the time.    PAIN:  Are you having pain? No    OBJECTIVE: (objective measures completed at initial evaluation unless otherwise dated)    VITALS: BP104/63 HR 82 SpO2 100   DIAGNOSTIC FINDINGS:  Clinical Data:  Low back pain.  Radiculopathy.  Right hip and lower extremity pain and numbness.  Status post diskectomy in 1993.  History of renal cell carcinoma with left nephrectomy in 1986.  MRI LUMBAR SPINE WITHOUT AND WITH CONTRAST:  Technique:  Multiplanar and multiecho pulse sequences of the lumbar spine, to include the lower thoracic region and upper sacral regions, were obtained according to standard protocol before and after administration of intravenous contrast.  Contrast:  13 mL Omniscan.  Comparison:  None.  Findings:  As stated, the patient is status post left nephrectomy.  Soft tissues are otherwise unremarkable.  Normal signal is present in the conus medullaris which terminates at L1-2.  Vertebral body height and alignment are maintained.  There are endplate marrow changes at L2-3 and L5-S1.  Marrow signal is otherwise within normal limits.    L1-2:  Normal.  L2-3:  There is near complete loss of disc height with some disc bulging and significant osteophyte formation.  This results in moderate left neural foraminal narrowing.  The central canal and right neural foramen are patent.  Endplate changes are seen with sclerosis and mild enhancement.  L3-4:  A broad-based disc bulge and facet hypertrophy are present.  The disc bulge contacts the right L4 nerve roots within the lateral recess.  L3 nerve roots appear to exit without contact.  The right L3 nerve root is displaced posteriorly by the disc bulge, although not contacted.  L4-5:  A broad-based disc bulge, facet hypertrophy, and ligamentum flavum thickening are present.  This results in mild central canal stenosis with impact on the lateral recess bilaterally, right greater than left.  The disc bulge also contacts the right L4 nerve root  with moderate right neural foraminal narrowing.  The left neural foramen is patent.  L5-S1:  The patient is status post laminectomy at this level.  There is grade I anterolisthesis, greater on the right than left.  Some subluxation of the right facet joints is noted.  Moderate right neural foraminal narrowing secondary to this in conjunction with osteophyte formation of the S1 vertebral body.  There may be a pars defect on the right.  No definite pars defect is seen on the left.  Sclerotic endplate changes are noted.  IMPRESSION:  1.  Moderate left neural foraminal narrowing L2-3.  2.  Moderate right neural foraminal narrowing L4-5 and L5-S1.  3.  Mild central canal stenosis with impact on the lateral recess at L4-5.  4.  Anterolisthesis greater on the right at  L5-S1.   Provider: Donnella Bi, Wilford Grist   PATIENT SURVEYS:  FOTO 58/100 with target of  67  08/26/22: 63/100 with target of 59   SCREENING FOR RED FLAGS: Bowel or bladder incontinence: No Spinal tumors: No Cauda equina syndrome: No Compression fracture: No Abdominal aneurysm: No   COGNITION:           Overall cognitive status: Within functional limits for tasks assessed                          SENSATION: Light touch: N/t on lateral side of right leg that radiates down from low back to ankle with some weakness in his right knee.    MUSCLE LENGTH: Hamstrings: Right 80 deg; Left 80 deg Thomas test: + bilateral   POSTURE: right pelvic obliquity   PALPATION: Right glute    LUMBAR ROM:    Active  A/PROM  eval  Flexion 100%  Extension 100%  Right lateral flexion 100%  Left lateral flexion 100%  Right rotation 100%*  Left rotation 100%   (Blank rows = not tested)   LOWER EXTREMITY ROM:            Active  Right 07/23/2022 Left 07/23/2022  Hip flexion 120 120  Hip extension 30 30  Hip abduction 45 45  Hip adduction 30 30  Hip internal rotation 45 45  Hip external rotation 45 45  Knee flexion 135  135  Knee extension 0 0  Ankle dorsiflexion 20 20  Ankle plantarflexion 50 50  Ankle inversion 35 35  Ankle eversion 15 15   (Blank rows = not tested)        LOWER EXTREMITY MMT:     MMT Right eval Left eval Right  09/01/22 Left  09/01/22  Hip flexion 5 5    Hip extension 4 4 4+ 4+  Hip abduction 4 4 4+ 4  Hip adduction 4- 4- 4+ 4+  Hip internal rotation        Hip external rotation        Knee flexion 5 5    Knee extension 5 5    Ankle dorsiflexion 5 5    Ankle plantarflexion        Ankle inversion        Ankle eversion         (Blank rows = not tested)   LUMBAR SPECIAL TESTS:  Straight leg raise test: Negative, FABER test: Negative, Thomas test: Positive, and Thigh Trust negative bilateral      GAIT: Distance walked: 40 ft  Assistive device utilized: None Level of assistance: Complete Independence Comments: No abnormalities        TODAY'S TREATMENT   09/08/22               Nu-Step resistance level 2 with seat and arm level at 8 for 5 min              Seated Rows #25 3 x 10               Lat Pull Downs #35 3 x 10               Y Wall Slide to Lift Off 3 x 10               Quadruped Full Range Thoracic Rotation and Reach 3 x 10   09/01/22  Nu-Step resistance level 3 with seat and arm level at 8 for 5 min              See MMT listed above               Left Hip Abduction in Side Lying 3 x 10                -mod TC to keep hips from rolling forward               Single Leg Figure 4 Bridge 3 x 10               Side Lying Hip Adduction 3 x 10   08/26/22             Nu-Step resistance level 3 with seat and arm level at 8 for 5 min              Pallof Press with Black TB 2 x 10              -Pt reports feeling that it is working his arms more than his abdominals              Boat Pose with abdominal rotation with #5 DB 1 x 10              Supine Crunches 3 x 10               Standing HS Curls 1 x 10              Standing HS Curls with Red band 2 x  10               -Use one hand with right foot and no hands with left foot              OMEGA Hip Adduction #70 3 x 10 on setting of 6 for arm    08/19/22:             Nu-Step resistance level 4 with seat and arm level at 8 for 5 min              OMEGA Pallof Press with #10 lbs 3 x 10              Standing Hip Abduction with yellow band 3 x 10              Mini-Squats from 24 inch mat height with 2 x 8 lb DB 3 x 10             Standing Hip Adduction with sliders 3 x 10               Side Lying Hip Adduction 1 x 10      PATIENT EDUCATION:  Education details: form and technique and explanation of plan of care   Person educated: Patient Education method: Explanation, Demonstration, Verbal cues, and Handouts Education comprehension: verbalized understanding and returned demonstration     HOME EXERCISE PROGRAM: Access Code: [email or web address] URL: [email or web address] Date: 09/08/2022 Prepared by: Bradly Chris  Exercises - Supine Lower Trunk Rotation  - 1 x daily - 3 sets - 10 reps - Modified Thomas Stretch  - 1 x daily - 3 reps - 60 hold - Quadruped Full Range Thoracic Rotation with Reach  - 1 x daily - 3 sets - 10 reps - Bird Dog  - 3 x weekly - 3 sets - 8 reps -  Latissimus Dorsi Stretch at Wall  - 3 reps - 60 hold - Ab Prep  - 1 x daily - 3 x weekly - 3 sets - 10 reps - Sidelying Hip Abduction  - 1 x daily - 3 x weekly - 3 sets - 10 reps - Figure 4 Bridge  - 1 x daily - 3 x weekly - 3 sets - 10 reps - Sidelying Hip Adduction  - 1 x daily - 3 x weekly - 3 sets - 10 reps - Low Trap Setting at Wall  - 1 x daily - 3 x weekly - 3 sets - 10 reps - Seated Quadratus Lumborum Stretch in Chair  - 1 x daily - 3 reps - 30-60 sec hold ASSESSMENT:   CLINICAL IMPRESSION:  Pt able to perform all exercises without an increase in pain with an emphasis on thoracic posterior chain strengthening and mobility. He continues to experience restrictions with left sided side bending due to  concavity to left. He will continue to benefit from skilled PT to increase his hip mobility and strength and to decrease low back pain to walk his dog and complete yard work without an increase in his low back pain.  OBJECTIVE IMPAIRMENTS decreased strength, impaired flexibility, postural dysfunction, and pain.    ACTIVITY LIMITATIONS carrying, lifting, bending, standing, squatting, and stairs   PARTICIPATION LIMITATIONS: shopping, yard work, and church   PERSONAL FACTORS Age, Time since onset of injury/illness/exacerbation, and 1 comorbidity: Idiopathic scoliosis  are also affecting patient's functional outcome.    REHAB POTENTIAL: Good   CLINICAL DECISION MAKING: Stable/uncomplicated   EVALUATION COMPLEXITY: Low     GOALS: Goals reviewed with patient? No   SHORT TERM GOALS: Target date: 08/06/2022   Pt will be independent with HEP in order to improve strength and balance in order to decrease fall risk and improve function at home and work. Baseline: NT  Goal status: Ongoing        LONG TERM GOALS: Target date: 09/17/2022   Patient will have improved function and activity level as evidenced by an increase in FOTO score by 10 points or more.  Baseline: 58/100 08/19/22: 60/100  08/26/22: 63/100 Goal status: Ongoing    2.  Patient will improve hip strength by 1/2 MMT to help offload spinal structures to decrease pain in lumbar spine to return to standing activities without experiencing increased pain.  Baseline:  Hip Abd & Ext R/L 4/4, Hip Add R/L 4-/4- Goal status: Ongoing    3.  Patient will maintain a standing posture for >=10 min without needing a seated rest break to indicate improved activity tolerance and decrease in low back pain symptoms.  Baseline: NT  Goal status: Ongoing        PLAN: PT FREQUENCY: 1-2x/week   PT DURATION: 8 weeks   PLANNED INTERVENTIONS: Therapeutic exercises, Therapeutic activity, Neuromuscular re-education, Gait training, Patient/Family  education, Joint mobilization, Joint manipulation, Stair training, DME instructions, Aquatic Therapy, Dry Needling, Electrical stimulation, Spinal manipulation, Spinal mobilization, Cryotherapy, Moist heat, Traction, Manual therapy, and Re-evaluation.   PLAN FOR NEXT SESSION: Perform all of session in standing. Review lumbar mobility exercises in HEP. Progress hip extension and abdominal exercises.   Bradly Chris PT, DPT  09/08/2022, 8:17 AM

## 2022-09-11 ENCOUNTER — Encounter: Payer: Self-pay | Admitting: Family Medicine

## 2022-09-11 ENCOUNTER — Ambulatory Visit (INDEPENDENT_AMBULATORY_CARE_PROVIDER_SITE_OTHER): Payer: PPO | Admitting: Family Medicine

## 2022-09-11 ENCOUNTER — Ambulatory Visit: Payer: PPO | Admitting: Physical Therapy

## 2022-09-11 VITALS — BP 102/60 | HR 83 | Temp 97.8°F | Ht 64.0 in | Wt 141.0 lb

## 2022-09-11 DIAGNOSIS — Z23 Encounter for immunization: Secondary | ICD-10-CM

## 2022-09-11 DIAGNOSIS — G4730 Sleep apnea, unspecified: Secondary | ICD-10-CM

## 2022-09-11 DIAGNOSIS — M4120 Other idiopathic scoliosis, site unspecified: Secondary | ICD-10-CM

## 2022-09-11 DIAGNOSIS — E7800 Pure hypercholesterolemia, unspecified: Secondary | ICD-10-CM

## 2022-09-11 DIAGNOSIS — A048 Other specified bacterial intestinal infections: Secondary | ICD-10-CM

## 2022-09-11 DIAGNOSIS — Z0000 Encounter for general adult medical examination without abnormal findings: Secondary | ICD-10-CM

## 2022-09-11 DIAGNOSIS — Z7189 Other specified counseling: Secondary | ICD-10-CM

## 2022-09-11 DIAGNOSIS — C642 Malignant neoplasm of left kidney, except renal pelvis: Secondary | ICD-10-CM

## 2022-09-11 DIAGNOSIS — C61 Malignant neoplasm of prostate: Secondary | ICD-10-CM

## 2022-09-11 MED ORDER — SILDENAFIL CITRATE 20 MG PO TABS: ORAL_TABLET | ORAL | 99 refills | Status: DC

## 2022-09-11 MED ORDER — SIMVASTATIN 40 MG PO TABS: 40.0000 mg | ORAL_TABLET | Freq: Every evening | ORAL | 3 refills | Status: DC

## 2022-09-11 NOTE — Progress Notes (Signed)
I have personally reviewed the Medicare Annual Wellness questionnaire and have noted 1. The patient's medical and social history 2. Their use of alcohol, tobacco or illicit drugs 3. Their current medications and supplements 4. The patient's functional ability including ADL's, fall risks, home safety risks and hearing or visual             impairment. 5. Diet and physical activities 6. Evidence for depression or mood disorders  The patients weight, height, BMI have been recorded in the chart and visual acuity is per eye clinic.  I have made referrals, counseling and provided education to the patient based review of the above and I have provided the pt with a written personalized care plan for preventive services.  Provider list updated- see scanned forms.  Routine anticipatory guidance given to patient.  See health maintenance. The possibility exists that previously documented standard health maintenance information may have been brought forward from a previous encounter into this note.  If needed, that same information has been updated to reflect the current situation based on today's encounter.    Flu 2023 Shingles previously done PNA previously done Tetanus 2018 COVID-vaccine previously done Colon cancer screening 2023. Prostate cancer screening per urology.  I will defer.  He agrees. Advance directive-wife and daughter designated if patient were incapacitated. Cognitive function addressed- see scanned forms- and if abnormal then additional documentation follows.   In addition to [facility], follow up visit for the below conditions:  No recently metoprolol use.    Seeing Dr. Halford Chessman with pulmonary.  Compliant with CPAP.  I will defer.  He agrees.  H pylori hx d/w pt.  No PPI use now.  No abd pain, no blood in stool.  Doing well.  Taking activia BID.    Elevated Cholesterol: Using medications without problems: yes Muscle aches: not from statin.  He is putting up with this back  at baseline.  Diet compliance: yes Exercise: yes Labs d/w pt.    H/o nephrectomy.  CR d/w pt.  Similar to prev.    He occ use 1/2 dose of Vicks cough syrup at night for congestion, cough, d/w pt.  It worked w/o ADE.    He is still going to PT at baseline for his back.    PMH and SH reviewed  Meds, vitals, and allergies reviewed.   ROS: Per HPI.  Unless specifically indicated otherwise in HPI, the patient denies:  General: fever. Eyes: acute vision changes ENT: sore throat Cardiovascular: chest pain Respiratory: SOB GI: vomiting GU: dysuria Musculoskeletal: acute back pain Derm: acute rash Neuro: acute motor dysfunction Psych: worsening mood Endocrine: polydipsia Heme: bleeding Allergy: hayfever  GEN: nad, alert and oriented HEENT:ncat NECK: supple w/o LA CV: rrr. PULM: ctab, no inc wob ABD: soft, +bs EXT: no edema SKIN: Well-perfused. Scoliosis noted.

## 2022-09-11 NOTE — Patient Instructions (Signed)
Update me as needed.  Thanks for your effort.  Take care.  Glad to see you.

## 2022-09-14 NOTE — Assessment & Plan Note (Signed)
History of. H/o nephrectomy.  CR d/w pt.  Similar to prev.

## 2022-09-14 NOTE — Assessment & Plan Note (Signed)
Continue simvastatin and update me as needed.  Labs discussed with patient.

## 2022-09-14 NOTE — Assessment & Plan Note (Signed)
Advance directive-wife and daughter designated if patient were incapacitated.

## 2022-09-14 NOTE — Assessment & Plan Note (Signed)
History of, per urology.  I will defer.  He agrees. 

## 2022-09-14 NOTE — Assessment & Plan Note (Signed)
Flu 2023 Shingles previously done PNA previously done Tetanus 2018 COVID-vaccine previously done Colon cancer screening 2023. Prostate cancer screening per urology.  I will defer.  He agrees. Advance directive-wife and daughter designated if patient were incapacitated. Cognitive function addressed- see scanned forms- and if abnormal then additional documentation follows.

## 2022-09-14 NOTE — Assessment & Plan Note (Signed)
H pylori hx d/w pt.  No PPI use now.  No abd pain, no blood in stool.  Doing well.  Taking activia BID.

## 2022-09-14 NOTE — Assessment & Plan Note (Signed)
Continue CPAP.  

## 2022-09-14 NOTE — Assessment & Plan Note (Signed)
Per pulmonary 

## 2022-09-15 ENCOUNTER — Encounter: Payer: PPO | Admitting: Physical Therapy

## 2022-09-15 DIAGNOSIS — G4733 Obstructive sleep apnea (adult) (pediatric): Secondary | ICD-10-CM | POA: Diagnosis not present

## 2022-09-16 ENCOUNTER — Ambulatory Visit: Payer: PPO | Admitting: Physical Therapy

## 2022-09-16 DIAGNOSIS — M5459 Other low back pain: Secondary | ICD-10-CM | POA: Diagnosis not present

## 2022-09-16 NOTE — Therapy (Signed)
OUTPATIENT PHYSICAL THERAPY DISCHARGE  NOTE   Patient Name: Kenneth Ross MRN: [id number] DOB:November 29, 1936, 86 y.o., male Today's Date: 09/16/2022  PCP: Dr.  Elsie Stain  REFERRING PROVIDER: Dr. Elsie Stain   END OF SESSION:   PT End of Session - 09/16/22 1150     Visit Number 9    Number of Visits 16    Date for PT Re-Evaluation 09/17/22    Authorization Type HTA    Authorization Time Period 07/22/22-09/17/22    Authorization - Visit Number 9    Authorization - Number of Visits 10    Progress Note Due on Visit 10    PT Start Time 1150    PT Stop Time 1230    PT Time Calculation (min) 40 min    Activity Tolerance Patient tolerated treatment well    Behavior During Therapy [facility] for tasks assessed/performed             Past Medical History:  Diagnosis Date   Allergic rhinitis, seasonal    Allergy    Bell's palsy    with L eyelid droop at baseline as of 2017   Blood transfusion without reported diagnosis    Cancer of kidney ([place])    Cataract    CKD (chronic kidney disease), stage III ([place])    followed by pcp   ED (erectile dysfunction)    GERD (gastroesophageal reflux disease)    occasional take tums   History of renal cell carcinoma    1986  s/p  left nephrectomy   Hyperlipidemia    Idiopathic scoliosis    Pneumonia    Prostate cancer [facility]) urologist-- dr dahlstedt/  oncologist-- dr [name]   dx 09-07-2019--- Stage T1c,  Gleason 4+3   Seasonal allergies    Sleep apnea    Cpap   Solitary right kidney    s/p  left nephrecotmy 1986   Tachycardia followed by pcp   HR 100 @ pcp office --- pt referred to cardiology for consult w/ dr t. Rockey Situ note in epic 12-14-2017, by pcp (dr Damita Dunnings) no work up done,  pt has lopressor as needed if heart rate is elevated as pt feels appropiate   Past Surgical History:  Procedure Laterality Date   CATARACT EXTRACTION Left 06/10/2018   Dr. Valetta Close   CATARACT EXTRACTION Right 2021   CATARACT EXTRACTION W/ INTRAOCULAR LENS  IMPLANT Left    LAMINECTOMY  1993   L3-5   NEPHRECTOMY Left 10/1985   RADIOACTIVE SEED IMPLANT N/A 10/21/2019   Procedure: RADIOACTIVE SEED IMPLANT/BRACHYTHERAPY IMPLANT;  Surgeon: Franchot Gallo, MD;  Location: [facility];  Service: Urology;  Laterality: N/A;  65 seeds implanted   SHOULDER OPEN ROTATOR CUFF REPAIR  12/1998   Right   SHOULDER OPEN ROTATOR CUFF REPAIR Bilateral 08/2001   SPACE OAR INSTILLATION N/A 10/21/2019   Procedure: SPACE OAR INSTILLATION;  Surgeon: Franchot Gallo, MD;  Location: [facility];  Service: Urology;  Laterality: N/A;   Patient Active Problem List   Diagnosis Date Noted   Sleep apnea    Acute renal failure superimposed on stage 3a chronic kidney disease (HCC)    Rash    H. pylori infection    Carpal tunnel syndrome 09/11/2021   Throat clearing 09/11/2021   Actinic keratosis 09/11/2021   Snoring 01/08/2020   Prostate cancer ([place]) 09/13/2019   Health care maintenance 08/31/2018   Tachycardia 10/27/2017   Idiopathic scoliosis 08/08/2015   Advance care planning 08/03/2014   Medicare annual  wellness visit, subsequent 07/26/2012   Hypercholesteremia 07/24/2011   Renal insufficiency 07/24/2011   ERECTILE DYSFUNCTION, ORGANIC 05/21/2010   FOOT PAIN, RIGHT 02/18/2008   Renal cell cancer, left ([place]) 07/22/2007    REFERRING DIAG: M54.9 (ICD-10-CM) - Back pain, unspecified back location, unspecified back pain laterality, unspecified chronicity  THERAPY DIAG:  Other low back pain  Rationale for Evaluation and Treatment Rehabilitation  PERTINENT HISTORY: Patient reports that he has right levo-scoliosis for most of his life. For the past year, he has been experiencing worsening low back pain. He mostly experiences this on his right side and it increases when standing up. He has h/o of low back surgery where he had discetomy in 1993 which he reports helping significantly. For physical activity, he walks dogs 3x per day,  mows lawn, and vacuums.   PRECAUTIONS: None   SUBJECTIVE: Pt continues to report an improvement in his symptoms with ability to stand and walk for longer periods of time without experiencing pain in his low back.    PAIN:  Are you having pain? No    OBJECTIVE: (objective measures completed at initial evaluation unless otherwise dated)    VITALS: BP104/63 HR 82 SpO2 100   DIAGNOSTIC FINDINGS:  Clinical Data:  Low back pain.  Radiculopathy.  Right hip and lower extremity pain and numbness.  Status post diskectomy in 1993.  History of renal cell carcinoma with left nephrectomy in 1986.  MRI LUMBAR SPINE WITHOUT AND WITH CONTRAST:  Technique:  Multiplanar and multiecho pulse sequences of the lumbar spine, to include the lower thoracic region and upper sacral regions, were obtained according to standard protocol before and after administration of intravenous contrast.  Contrast:  13 mL Omniscan.  Comparison:  None.  Findings:  As stated, the patient is status post left nephrectomy.  Soft tissues are otherwise unremarkable.  Normal signal is present in the conus medullaris which terminates at L1-2.  Vertebral body height and alignment are maintained.  There are endplate marrow changes at L2-3 and L5-S1.  Marrow signal is otherwise within normal limits.    L1-2:  Normal.  L2-3:  There is near complete loss of disc height with some disc bulging and significant osteophyte formation.  This results in moderate left neural foraminal narrowing.  The central canal and right neural foramen are patent.  Endplate changes are seen with sclerosis and mild enhancement.  L3-4:  A broad-based disc bulge and facet hypertrophy are present.  The disc bulge contacts the right L4 nerve roots within the lateral recess.  L3 nerve roots appear to exit without contact.  The right L3 nerve root is displaced posteriorly by the disc bulge, although not contacted.  L4-5:  A broad-based disc bulge, facet hypertrophy, and  ligamentum flavum thickening are present.  This results in mild central canal stenosis with impact on the lateral recess bilaterally, right greater than left.  The disc bulge also contacts the right L4 nerve root with moderate right neural foraminal narrowing.  The left neural foramen is patent.  L5-S1:  The patient is status post laminectomy at this level.  There is grade I anterolisthesis, greater on the right than left.  Some subluxation of the right facet joints is noted.  Moderate right neural foraminal narrowing secondary to this in conjunction with osteophyte formation of the S1 vertebral body.  There may be a pars defect on the right.  No definite pars defect is seen on the left.  Sclerotic endplate changes are noted.  IMPRESSION:  1.  Moderate left neural foraminal narrowing L2-3.  2.  Moderate right neural foraminal narrowing L4-5 and L5-S1.  3.  Mild central canal stenosis with impact on the lateral recess at L4-5.  4.  Anterolisthesis greater on the right at L5-S1.   Provider: Donnella Bi, Wilford Grist   PATIENT SURVEYS:  FOTO 58/100 with target of  67  08/26/22: 63/100 with target of 63   SCREENING FOR RED FLAGS: Bowel or bladder incontinence: No Spinal tumors: No Cauda equina syndrome: No Compression fracture: No Abdominal aneurysm: No   COGNITION:           Overall cognitive status: Within functional limits for tasks assessed                          SENSATION: Light touch: N/t on lateral side of right leg that radiates down from low back to ankle with some weakness in his right knee.    MUSCLE LENGTH: Hamstrings: Right 80 deg; Left 80 deg Thomas test: + bilateral   POSTURE: right pelvic obliquity   PALPATION: Right glute    LUMBAR ROM:    Active  A/PROM  eval  Flexion 100%  Extension 100%  Right lateral flexion 100%  Left lateral flexion 100%  Right rotation 100%*  Left rotation 100%   (Blank rows = not tested)   LOWER EXTREMITY ROM:             Active  Right 07/23/2022 Left 07/23/2022  Hip flexion 120 120  Hip extension 30 30  Hip abduction 45 45  Hip adduction 30 30  Hip internal rotation 45 45  Hip external rotation 45 45  Knee flexion 135 135  Knee extension 0 0  Ankle dorsiflexion 20 20  Ankle plantarflexion 50 50  Ankle inversion 35 35  Ankle eversion 15 15   (Blank rows = not tested)        LOWER EXTREMITY MMT:     MMT Right eval Left eval Right  09/01/22 Left  09/01/22 Right  09/16/22 Left  09/16/22  Hip flexion 5 5      Hip extension 4 4 4+ 4+ 5 5  Hip abduction 4 4 4+ 4 5 4+  Hip adduction 4- 4- 4+ 4+ 5 5  Hip internal rotation          Hip external rotation          Knee flexion 5 5      Knee extension 5 5      Ankle dorsiflexion 5 5      Ankle plantarflexion          Ankle inversion          Ankle eversion           (Blank rows = not tested)   LUMBAR SPECIAL TESTS:  Straight leg raise test: Negative, FABER test: Negative, Thomas test: Positive, and Thigh Trust negative bilateral      GAIT: Distance walked: 40 ft  Assistive device utilized: None Level of assistance: Complete Independence Comments: No abnormalities        TODAY'S TREATMENT   09/16/22:              Nu-Step resistance level 4 with seat and arm level at 8 for 5 min             Patient stands for 11+ min during session  FOTO: 77/100             Hip MMT:                  Abduction R/L 5/4+                  Adduction R/L 5/5                              Extension  R/L 5/5                       Reviewed HEP including progressions for each exercise and had patient complete progressed exercises:                         Wall Slides with yellow band 1 x 10                         Hip Abduction with red band 2 x 10                         Figure 4 Bridges 2 x 10                           09/08/22               Nu-Step resistance level 2 with seat and arm level at 8 for 5 min              Seated Rows #25 3 x 10                Lat Pull Downs #35 3 x 10               Y Wall Slide to Lift Off 3 x 10               Quadruped Full Range Thoracic Rotation and Reach 3 x 10   09/01/22              Nu-Step resistance level 3 with seat and arm level at 8 for 5 min              See MMT listed above               Left Hip Abduction in Side Lying 3 x 10                -mod TC to keep hips from rolling forward               Single Leg Figure 4 Bridge 3 x 10               Side Lying Hip Adduction 3 x 10   08/26/22             Nu-Step resistance level 3 with seat and arm level at 8 for 5 min              Pallof Press with Black TB 2 x 10              -Pt reports feeling that it is working his arms more than his abdominals              Boat Pose with abdominal rotation with #5 DB 1 x 10  Supine Crunches 3 x 10               Standing HS Curls 1 x 10              Standing HS Curls with Red band 2 x 10               -Use one hand with right foot and no hands with left foot              OMEGA Hip Adduction #70 3 x 10 on setting of 6 for arm     PATIENT EDUCATION:  Education details: form and technique and explanation of plan of care   Person educated: Patient Education method: Explanation, Demonstration, Verbal cues, and Handouts Education comprehension: verbalized understanding and returned demonstration     HOME EXERCISE PROGRAM:                 Access Code: [email or web address] URL: [email or web address] Date: 09/16/2022 Prepared by: Bradly Chris  Exercises - Supine Lower Trunk Rotation  - 1 x daily - 3 sets - 10 reps - Latissimus Dorsi Stretch at Wall  - 1 x daily - 3 reps - 60 hold - Seated Quadratus Lumborum Stretch in Chair  - 1 x daily - 3 reps - 30-60 sec hold - Quadruped Full Range Thoracic Rotation with Reach  - 1 x daily - 3 sets - 10 reps - Bird Dog  - 3 x weekly - 3 sets - 8 reps - Sidelying Hip Abduction  - 3 x weekly - 3 sets - 10 reps - Figure 4 Bridge  - 1 x daily - 3  x weekly - 3 sets - 10 reps - Sidelying Hip Adduction  - 1 x daily - 3 x weekly - 3 sets - 10 reps - Low Trap Setting at [place]  - 3 x weekly - 3 sets - 10 reps - Oblique Bicycle Crunch  - 3 x weekly - 3 sets - 10 reps   ASSESSMENT:   CLINICAL IMPRESSION: Pt has met all of his PT goals and he is ready for discharge. He exhibits and improvement in his hip strength and activity tolerance with standing for a prolonged period of time. Pt also shows an improved self-perception of function with improved FOTO scores. He is ready for discharge and he has been given a robust exercise plan to continue to maintain functional gains.    OBJECTIVE IMPAIRMENTS decreased strength, impaired flexibility, postural dysfunction, and pain.    ACTIVITY LIMITATIONS carrying, lifting, bending, standing, squatting, and stairs   PARTICIPATION LIMITATIONS: shopping, yard work, and church   PERSONAL FACTORS Age, Time since onset of injury/illness/exacerbation, and 1 comorbidity: Idiopathic scoliosis  are also affecting patient's functional outcome.    REHAB POTENTIAL: Good   CLINICAL DECISION MAKING: Stable/uncomplicated   EVALUATION COMPLEXITY: Low     GOALS: Goals reviewed with patient? No   SHORT TERM GOALS: Target date: 08/06/2022   Pt will be independent with HEP in order to improve strength and balance in order to decrease fall risk and improve function at home and work. Baseline: Able to perform independently  Goal status: Achieved        LONG TERM GOALS: Target date: 09/17/2022   Patient will have improved function and activity level as evidenced by an increase in FOTO score by 10 points or more.  Baseline: 58/100 08/19/22: 60/100  08/26/22: 63/100 09/16/22: 77/100 Goal status: Achieved    2.  Patient will  improve hip strength by 1/2 MMT to help offload spinal structures to decrease pain in lumbar spine to return to standing activities without experiencing increased pain.  Baseline:  Hip Abd & Ext  R/L 4/4, Hip Add R/L 4-/4-  09/16/22: Hip Abd R/L 5/4+, Hip Add R/L 5/5, Hip Ex R/L 5/5  Goal status: Partially met    3.  Patient will maintain a standing posture for >=10 min without needing a seated rest break to indicate improved activity tolerance and decrease in low back pain symptoms.  Baseline: Able to perform for >10 min without needing a break  Goal status: Achieved        PLAN: PT FREQUENCY: 1-2x/week   PT DURATION: 8 weeks   PLANNED INTERVENTIONS: Therapeutic exercises, Therapeutic activity, Neuromuscular re-education, Gait training, Patient/Family education, Joint mobilization, Joint manipulation, Stair training, DME instructions, Aquatic Therapy, Dry Needling, Electrical stimulation, Spinal manipulation, Spinal mobilization, Cryotherapy, Moist heat, Traction, Manual therapy, and Re-evaluation.   PLAN FOR NEXT SESSION: Perform all of session in standing. Review lumbar mobility exercises in HEP. Progress hip extension and abdominal exercises.   Bradly Chris PT, DPT  09/16/2022, 11:54 AM

## 2022-09-22 ENCOUNTER — Encounter: Payer: PPO | Admitting: Physical Therapy

## 2022-09-23 ENCOUNTER — Encounter: Payer: PPO | Admitting: Physical Therapy

## 2022-10-01 ENCOUNTER — Encounter: Payer: PPO | Admitting: Physical Therapy

## 2022-10-07 ENCOUNTER — Encounter: Payer: PPO | Admitting: Physical Therapy

## 2022-10-13 DIAGNOSIS — Z961 Presence of intraocular lens: Secondary | ICD-10-CM | POA: Diagnosis not present

## 2022-10-13 DIAGNOSIS — H524 Presbyopia: Secondary | ICD-10-CM | POA: Diagnosis not present

## 2022-10-14 ENCOUNTER — Encounter: Payer: PPO | Admitting: Physical Therapy

## 2022-10-15 DIAGNOSIS — G4733 Obstructive sleep apnea (adult) (pediatric): Secondary | ICD-10-CM | POA: Diagnosis not present

## 2022-12-12 ENCOUNTER — Ambulatory Visit (INDEPENDENT_AMBULATORY_CARE_PROVIDER_SITE_OTHER)
Admission: RE | Admit: 2022-12-12 | Discharge: 2022-12-12 | Disposition: A | Discharge status: Home / Self Care | Payer: PPO | Source: Ambulatory Visit | Attending: Family Medicine | Admitting: Family Medicine

## 2022-12-12 ENCOUNTER — Ambulatory Visit (INDEPENDENT_AMBULATORY_CARE_PROVIDER_SITE_OTHER): Payer: PPO | Admitting: Family Medicine

## 2022-12-12 ENCOUNTER — Encounter: Payer: Self-pay | Admitting: Family Medicine

## 2022-12-12 VITALS — BP 118/60 | HR 70 | Temp 97.8°F | Ht 64.0 in | Wt 143.0 lb

## 2022-12-12 DIAGNOSIS — M25511 Pain in right shoulder: Secondary | ICD-10-CM

## 2022-12-12 MED ORDER — HYDROCODONE-ACETAMINOPHEN 5-325 MG PO TABS: 1.0000 | ORAL_TABLET | Freq: Four times a day (QID) | ORAL | 0 refills | Status: DC | PRN

## 2022-12-12 NOTE — Patient Instructions (Signed)
Go to the lab on the way out.   If you have mychart we'll likely use that to update you.    Hydrocodone as needed, sedation caution.  Arm swings in the meantime.  Let me know if you don't get a call about seeing Dr. Marlou Sa.  Take care.  Glad to see you.

## 2022-12-12 NOTE — Progress Notes (Signed)
Right arm pain x 2 weeks. Patient states it is hard to move and does not remember having any injuries.  Pain with overhead movement. No pain with ROM at elbow. Normal grip.  Pain with ext > int rotation.  Pain sleeping on R side from his back at baseline, also can't sleep on back due to CPAP.  No known bruising.   Normal ROM L shoulder.

## 2022-12-14 DIAGNOSIS — M25511 Pain in right shoulder: Secondary | ICD-10-CM | POA: Insufficient documentation

## 2022-12-14 NOTE — Assessment & Plan Note (Signed)
Concern for rotator cuff pathology.  Check plain films today.  See notes on imaging. Hydrocodone as needed, sedation caution.  Routine cautions discussed with patient. Arm swings in the meantime to prevent frozen shoulder. He had seen Dr. Marlou Sa previously.  Refer back.  He can let me know if he does not get a call about the referral.

## 2022-12-24 DIAGNOSIS — G4733 Obstructive sleep apnea (adult) (pediatric): Secondary | ICD-10-CM | POA: Diagnosis not present

## 2022-12-31 ENCOUNTER — Encounter: Payer: Self-pay | Admitting: Orthopedic Surgery

## 2022-12-31 ENCOUNTER — Ambulatory Visit (INDEPENDENT_AMBULATORY_CARE_PROVIDER_SITE_OTHER): Payer: HMO | Admitting: Orthopedic Surgery

## 2022-12-31 DIAGNOSIS — M19211 Secondary osteoarthritis, right shoulder: Secondary | ICD-10-CM

## 2022-12-31 NOTE — Progress Notes (Signed)
Office Visit Note   Patient: Kenneth Ross           Date of Birth: Mar 17, 1936           MRN: [id number] Visit Date: 12/31/2022 Requested by: Tonia Ghent, MD [place],  [place] [address] PCP: Tonia Ghent, MD  Subjective: Chief Complaint  Patient presents with   Right Shoulder - Pain    HPI: Kenneth Ross is a 87 y.o. male who presents to the office reporting right shoulder pain.  He has radiographs on the system which are reviewed.  Does show superior migration of the humeral head consistent with rotator cuff arthropathy.  Has a history of rotator cuff tear repair 24 years ago.  He is right-hand dominant.  The pain comes and goes.  Has good and bad days.  The pain does not wake him from sleep.  Takes Tylenol as needed for symptoms along with a half Norco per day to help with pain at night.  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                ROS: All systems reviewed are negative as they relate to the chief complaint within the history of present illness.  Patient denies fevers or chills.  Assessment & Plan: Visit Diagnoses:  1. 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  He does have predictable weakness but overall the unction [name] of his shoulder is pretty reasonable at this time.  Plan is observation for now.  If his symptoms worsen we could consider injection for temporary relief or reverse shoulder replacement depending onHow he is doing clinically.  Follow-up with [place] as needed.   Follow-Up Instructions: No follow-ups on file.   Orders:  No orders of the defined types were placed in this encounter.  No orders of the defined types were placed in this encounter.     Procedures: No procedures performed   Clinical Data: No additional  findings.  Objective: Vital Signs: There were no vitals taken for this visit.  Physical Exam:  Constitutional: Patient appears well-developed HEENT:  Head: Normocephalic Eyes:EOM are normal Neck: Normal range of motion Cardiovascular: Normal rate Pulmonary/chest: Effort normal Neurologic: Patient is alert Skin: Skin is warm Psychiatric: Patient has normal mood and affect  Ortho Exam: Ortho exam demonstrates passive range of motion of 70/100/170 on the right.  Does have weakness to infraspinatus supraspinatus and subscap muscle testing with no Popeye deformity in the shoulder.  Deltoid is functional.  Cervical spine range of motion intact.  Does have some coarseness with passive range of motion of that right shoulder at 90 degrees of abduction.  Specialty Comments:  No specialty comments available.  Imaging: No results found.   PMFS History: Patient Active Problem List   Diagnosis Date Noted   Right shoulder pain 12/14/2022   Sleep apnea    Acute renal failure superimposed on stage 3a chronic kidney disease (HCC)    Rash    H. pylori infection    Carpal tunnel syndrome 09/11/2021   Throat clearing 09/11/2021   Actinic keratosis 09/11/2021   Snoring 01/08/2020   Prostate cancer ([place]) 09/13/2019   Health care maintenance 08/31/2018   Tachycardia 10/27/2017   Idiopathic scoliosis 08/08/2015   Advance care planning 08/03/2014   Medicare annual wellness visit, subsequent  07/26/2012   Hypercholesteremia 07/24/2011   Renal insufficiency 07/24/2011   ERECTILE DYSFUNCTION, ORGANIC 05/21/2010   FOOT PAIN, RIGHT 02/18/2008   Renal cell cancer, left ([place]) 07/22/2007   Past Medical History:  Diagnosis Date   Allergic rhinitis, seasonal    Allergy    Bell's palsy    with L eyelid droop at baseline as of 2017   Blood transfusion without reported diagnosis    Cancer of kidney ([place])    Cataract    CKD (chronic kidney disease), stage III ([place])    followed by pcp   ED  (erectile dysfunction)    GERD (gastroesophageal reflux disease)    occasional take tums   History of renal cell carcinoma    1986  s/p  left nephrectomy   Hyperlipidemia    Idiopathic scoliosis    Pneumonia    Prostate cancer [facility]) urologist-- dr dahlstedt/  oncologist-- dr [name]   dx 09-07-2019--- Stage T1c,  Gleason 4+3   Seasonal allergies    Sleep apnea    Cpap   Solitary right kidney    s/p  left nephrecotmy 1986   Tachycardia followed by pcp   HR 100 @ pcp office --- pt referred to cardiology for consult w/ dr t. Rockey Situ note in epic 12-14-2017, by pcp (dr Damita Dunnings) no work up done,  pt has lopressor as needed if heart rate is elevated as pt feels appropiate    Family History  Problem Relation Age of Onset   Cancer Mother        breast   Stroke Mother        mets stroke  80   Fibromyalgia Mother    Arthritis Mother    Gallbladder disease Mother    Hypertension Mother    Heart disease Father    Cancer Father        lung and prostate metastases   Stroke Father        mini strokes   Prostate cancer Father    Gallbladder disease Father    Hypertension Father    Liver cancer Sister    Lupus Sister    Hepatitis C Sister    Diabetes Sister    GER disease Sister    Esophageal cancer Paternal Aunt    Cancer Maternal Grandmother        type unknown   Gallbladder disease Paternal Grandmother    Stroke Paternal Grandfather    COPD Daughter    Bronchitis Daughter    Liver disease Daughter    Colon cancer Cousin    Rectal cancer Cousin    Bladder Cancer Cousin    Colon polyps Neg Hx    Stomach cancer Neg Hx     Past Surgical History:  Procedure Laterality Date   CATARACT EXTRACTION Left 06/10/2018   Dr. Valetta Close   CATARACT EXTRACTION Right 2021   CATARACT EXTRACTION W/ [place] Left    LAMINECTOMY  1993   L3-5   NEPHRECTOMY Left 10/1985   RADIOACTIVE SEED IMPLANT N/A 10/21/2019   Procedure: RADIOACTIVE SEED IMPLANT/BRACHYTHERAPY IMPLANT;   Surgeon: Franchot Gallo, MD;  Location: [facility];  Service: Urology;  Laterality: N/A;  65 seeds implanted   SHOULDER OPEN ROTATOR CUFF REPAIR  12/1998   Right   SHOULDER OPEN ROTATOR CUFF REPAIR Bilateral 08/2001   SPACE OAR INSTILLATION N/A 10/21/2019   Procedure: SPACE OAR INSTILLATION;  Surgeon: Franchot Gallo, MD;  Location[facility];  Service: Urology;  Laterality: N/A;  Social History   Occupational History   Occupation: Retired  Tobacco Use   Smoking status: Never   Smokeless tobacco: Never  Scientific laboratory technician Use: Never used  Substance and Sexual Activity   Alcohol use: No    Alcohol/week: 0.0 standard drinks of alcohol   Drug use: Never   Sexual activity: Yes

## 2023-01-07 DIAGNOSIS — C61 Malignant neoplasm of prostate: Secondary | ICD-10-CM | POA: Diagnosis not present

## 2023-01-14 DIAGNOSIS — Z8546 Personal history of malignant neoplasm of prostate: Secondary | ICD-10-CM | POA: Diagnosis not present

## 2023-01-14 DIAGNOSIS — C61 Malignant neoplasm of prostate: Secondary | ICD-10-CM | POA: Diagnosis not present

## 2023-01-30 ENCOUNTER — Ambulatory Visit (INDEPENDENT_AMBULATORY_CARE_PROVIDER_SITE_OTHER): Payer: HMO | Admitting: Surgical

## 2023-01-30 ENCOUNTER — Encounter: Payer: Self-pay | Admitting: Surgical

## 2023-01-30 DIAGNOSIS — R202 Paresthesia of skin: Secondary | ICD-10-CM | POA: Diagnosis not present

## 2023-01-30 DIAGNOSIS — R200 Anesthesia of skin: Secondary | ICD-10-CM

## 2023-01-30 NOTE — Progress Notes (Signed)
Office Visit Note   Patient: Kenneth Ross           Date of Birth: 1936-09-03           MRN: [id number] Visit Date: 01/30/2023 Requested by: Tonia Ghent, MD [place],  [place] [address] PCP: Tonia Ghent, MD  Subjective: Chief Complaint  Patient presents with  . Left Hand - Numbness    HPI: Kenneth Ross is a 87 y.o. male who presents to the office reporting left hand pain.  Patient states that he has 2 to 3 years of left hand pain that he localizes to the palmar region of the thumb, index, middle fingers.  He has pain and numbness/tingling in this distribution.  He has had 2 prior carpal tunnel injections by Dr. Lorelei Pont with good relief.  Last injection lasted from August until January.  He is right-hand dominant and has right hand symptoms that bother him to a significantly lesser degree than the left hand.  He does not work and is retired but he does do some work around [employer].  He tries splints at night which helps but they are no longer taking care of the problem.  He would like to consider surgical intervention.  He has not had nerve conduction study..                ROS: All systems reviewed are negative as they relate to the chief complaint within the history of present illness.  Patient denies fevers or chills.  Assessment & Plan: Visit Diagnoses:  1. Numbness and tingling in left hand     Plan: ***  Follow-Up Instructions: No follow-ups on file.   Orders:  Orders Placed This Encounter  Procedures  . Ambulatory referral to Physical Medicine Rehab   No orders of the defined types were placed in this encounter.     Procedures: No procedures performed   Clinical Data: No additional findings.  Objective: Vital Signs: There were no vitals taken for this visit.  Physical Exam:  Constitutional: Patient appears well-developed HEENT:  Head: Normocephalic Eyes:EOM are normal Neck: Normal range of motion Cardiovascular:  Normal rate Pulmonary/chest: Effort normal Neurologic: Patient is alert Skin: Skin is warm Psychiatric: Patient has normal mood and affect  Ortho Exam: Ortho exam demonstrates left hand with ***  Specialty Comments:  No specialty comments available.  Imaging: No results found.   PMFS History: Patient Active Problem List   Diagnosis Date Noted  . Right shoulder pain 12/14/2022  . Sleep apnea   . Acute renal failure superimposed on stage 3a chronic kidney disease ([place])   . Rash   . H. pylori infection   . Carpal tunnel syndrome 09/11/2021  . Throat clearing 09/11/2021  . Actinic keratosis 09/11/2021  . Snoring 01/08/2020  . Prostate cancer ([place]) 09/13/2019  . Health care maintenance 08/31/2018  . Tachycardia 10/27/2017  . Idiopathic scoliosis 08/08/2015  . Advance care planning 08/03/2014  . Medicare annual wellness visit, subsequent 07/26/2012  . Hypercholesteremia 07/24/2011  . Renal insufficiency 07/24/2011  . 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 FOOT PAIN, RIGHT 02/18/2008  . Renal cell cancer, left ([place]) 07/22/2007   Past Medical History:  Diagnosis Date  . Allergic rhinitis, seasonal   . Allergy   . Bell's palsy    with L eyelid droop at baseline as of 2017  . Blood transfusion without reported diagnosis   . Cancer of kidney ([place])   . Cataract   .  CKD (chronic kidney disease), stage III ([place])    followed by pcp  . ED (erectile dysfunction)   . GERD (gastroesophageal reflux disease)    occasional take tums  . History of renal cell carcinoma    1986  s/p  left nephrectomy  . Hyperlipidemia   . Idiopathic scoliosis   . Pneumonia   . Prostate cancer [facility]) urologist-- dr dahlstedt/  oncologist-- dr [name]   dx 09-07-2019--- Stage T1c,  Gleason 4+3  . Seasonal allergies   . Sleep apnea    Cpap  . Solitary right kidney    s/p  left nephrecotmy 1986  . Tachycardia followed by pcp   HR 100 @ pcp office --- pt referred to cardiology for consult w/  dr t. Rockey Situ note in epic 12-14-2017, by pcp (dr Damita Dunnings) no work up done,  pt has lopressor as needed if heart rate is elevated as pt feels appropiate    Family History  Problem Relation Age of Onset  . Cancer Mother        breast  . Stroke Mother        mets stroke  41  . Fibromyalgia Mother   . Arthritis Mother   . Gallbladder disease Mother   . Hypertension Mother   . Heart disease Father   . Cancer Father        lung and prostate metastases  . Stroke Father        mini strokes  . Prostate cancer Father   . Gallbladder disease Father   . Hypertension Father   . Liver cancer Sister   . Lupus Sister   . Hepatitis C Sister   . Diabetes Sister   . GER disease Sister   . Esophageal cancer Paternal Aunt   . Cancer Maternal Grandmother        type unknown  . Gallbladder disease Paternal Grandmother   . Stroke Paternal Grandfather   . COPD Daughter   . Bronchitis Daughter   . Liver disease Daughter   . Colon cancer Cousin   . Rectal cancer Cousin   . Bladder Cancer Cousin   . Colon polyps Neg Hx   . Stomach cancer Neg Hx     Past Surgical History:  Procedure Laterality Date  . CATARACT EXTRACTION Left 06/10/2018   Dr. Valetta Close  . CATARACT EXTRACTION Right 2021  . CATARACT EXTRACTION W/ INTRAOCULAR LENS IMPLANT Left   . LAMINECTOMY  1993   L3-5  . NEPHRECTOMY Left 10/1985  . RADIOACTIVE SEED IMPLANT N/A 10/21/2019   Procedure: RADIOACTIVE SEED IMPLANT/BRACHYTHERAPY IMPLANT;  Surgeon: Franchot Gallo, MD;  Location: [facility];  Service: Urology;  Laterality: N/A;  65 seeds implanted  . SHOULDER OPEN ROTATOR CUFF REPAIR  12/1998   Right  . SHOULDER OPEN ROTATOR CUFF REPAIR Bilateral 08/2001  . SPACE OAR INSTILLATION N/A 10/21/2019   Procedure: SPACE OAR INSTILLATION;  Surgeon: Franchot Gallo, MD;  Location: [facility];  Service: Urology;  Laterality: N/A;   Social History   Occupational History  . Occupation: Retired  Tobacco  Use  . Smoking status: Never  . Smokeless tobacco: Never  Vaping Use  . Vaping Use: Never used  Substance and Sexual Activity  . Alcohol use: No    Alcohol/week: 0.0 standard drinks of alcohol  . Drug use: Never  . Sexual activity: Yes

## 2023-01-31 ENCOUNTER — Encounter: Payer: Self-pay | Admitting: Surgical

## 2023-02-11 ENCOUNTER — Ambulatory Visit (INDEPENDENT_AMBULATORY_CARE_PROVIDER_SITE_OTHER): Payer: HMO | Admitting: Physical Medicine and Rehabilitation

## 2023-02-11 DIAGNOSIS — R531 Weakness: Secondary | ICD-10-CM | POA: Diagnosis not present

## 2023-02-11 DIAGNOSIS — M25511 Pain in right shoulder: Secondary | ICD-10-CM | POA: Diagnosis not present

## 2023-02-11 DIAGNOSIS — M542 Cervicalgia: Secondary | ICD-10-CM | POA: Diagnosis not present

## 2023-02-11 DIAGNOSIS — R202 Paresthesia of skin: Secondary | ICD-10-CM

## 2023-02-11 DIAGNOSIS — G8929 Other chronic pain: Secondary | ICD-10-CM | POA: Diagnosis not present

## 2023-02-11 NOTE — Progress Notes (Signed)
Functional Pain Scale - descriptive words and definitions  Distracting (5)    Aware of pain/able to complete some ADL's but limited by pain/sleep is affected and active distractions are only slightly useful. Moderate range order  Average Pain 4  Right handed. Left hand numbness, pain and weakness. Numbness in thumb, index finger and middle finger. Pain can radiate up to the left elbow

## 2023-02-13 NOTE — Procedures (Unsigned)
EMG & NCV Findings: Evaluation of the left median motor and the right median motor nerves showed prolonged distal onset latency (L5.9, R6.2 ms), reduced amplitude (L3.4, R1.6 mV), and decreased conduction velocity (Elbow-Wrist, L40, R42 m/s).  The left median (across palm) sensory nerve showed prolonged distal peak latency (Wrist, 10.2 ms) and prolonged distal peak latency (Palm, 9.5 ms).  The right median (across palm) sensory nerve showed prolonged distal peak latency (Wrist, 5.3 ms).  All remaining nerves (as indicated in the following tables) were within normal limits.  All left vs. right side differences were within normal limits.    Needle evaluation of the right abductor pollicis brevis muscle showed increased insertional activity, slightly increased spontaneous activity, and diminished recruitment.  All remaining muscles (as indicated in the following table) showed no evidence of electrical instability.    Impression: The above electrodiagnostic study is ABNORMAL and reveals evidence of a severe bilateral median nerve entrapment at the wrist (carpal tunnel syndrome) affecting sensory and motor components. The lesion is characterized by sensory and motor demyelination with evidence of axonal injury.   There is no significant electrodiagnostic evidence of any other focal nerve entrapment, brachial plexopathy or cervical radiculopathy.   Recommendations: 1.  Follow-up with referring physician. 2.  Continue current management of symptoms. 3.  Suggest surgical evaluation.  ___________________________ Laurence Spates FAAPMR Board Certified, American Board of Physical Medicine and Rehabilitation    Nerve Conduction Studies Anti Sensory Summary Table   Stim Site NR Peak (ms) Norm Peak (ms) P-T Amp (V) Norm P-T Amp Site1 Site2 Delta-P (ms) Dist (cm) Vel (m/s) Norm Vel (m/s)  Left Median Acr Palm Anti Sensory (2nd Digit)  30.2C  Wrist    *10.2 <3.6 11.8 >10 Wrist Palm 0.7 0.0    Palm    *9.5  <2.0 7.2         Right Median Acr Palm Anti Sensory (2nd Digit)  31.5C  Wrist    *5.3 <3.6 23.3 >10 Wrist Palm 3.4 0.0    Palm    1.9 <2.0 24.5         Left Radial Anti Sensory (Base 1st Digit)  31.6C  Wrist    2.2 <3.1 34.4  Wrist Base 1st Digit 2.2 0.0    Left Ulnar Anti Sensory (5th Digit)  31.3C  Wrist    3.7 <3.7 23.4 >15.0 Wrist 5th Digit 3.7 14.0 38 >38   Motor Summary Table   Stim Site NR Onset (ms) Norm Onset (ms) O-P Amp (mV) Norm O-P Amp Site1 Site2 Delta-0 (ms) Dist (cm) Vel (m/s) Norm Vel (m/s)  Left Median Motor (Abd Poll Brev)  31.7C  Wrist    *5.9 <4.2 *3.4 >5 Elbow Wrist 5.2 21.0 *40 >50  Elbow    11.1  2.7         Right Median Motor (Abd Poll Brev)  31.3C  Wrist    *6.2 <4.2 *1.6 >5 Elbow Wrist 5.2 22.0 *42 >50  Elbow    11.4  1.0         Left Ulnar Motor (Abd Dig Min)  31.8C  Wrist    3.0 <4.2 6.0 >3 B Elbow Wrist 3.2 20.0 63 >53  B Elbow    6.2  5.4  A Elbow B Elbow 1.1 10.0 91 >53  A Elbow    7.3  5.1          EMG   Side Muscle Nerve Root Ins Act Fibs Psw Amp Dur Poly Recrt Int Fraser Din  Comment  Left Abd Poll Brev Median C8-T1 Nml Nml Nml Nml Nml 0 Nml Nml   Left 1stDorInt Ulnar C8-T1 Nml Nml Nml Nml Nml 0 Nml Nml   Left PronatorTeres Median C6-7 Nml Nml Nml Nml Nml 0 Nml Nml   Right Abd Poll Brev Median C8-T1 *CRD *1+ *1+ Nml Nml 0 *Reduced Nml     Nerve Conduction Studies Anti Sensory Left/Right Comparison   Stim Site L Lat (ms) R Lat (ms) L-R Lat (ms) L Amp (V) R Amp (V) L-R Amp (%) Site1 Site2 L Vel (m/s) R Vel (m/s) L-R Vel (m/s)  Median Acr Palm Anti Sensory (2nd Digit)  30.2C  Wrist *10.2 *5.3 4.9 11.8 23.3 49.4 Wrist Palm     Palm *9.5 1.9 7.6 7.2 24.5 70.6       Radial Anti Sensory (Base 1st Digit)  31.6C  Wrist 2.2   34.4   Wrist Base 1st Digit     Ulnar Anti Sensory (5th Digit)  31.3C  Wrist 3.7   23.4   Wrist 5th Digit 38     Motor Left/Right Comparison   Stim Site L Lat (ms) R Lat (ms) L-R Lat (ms) L Amp (mV) R Amp (mV) L-R Amp (%)  Site1 Site2 L Vel (m/s) R Vel (m/s) L-R Vel (m/s)  Median Motor (Abd Poll Brev)  31.7C  Wrist *5.9 *6.2 0.3 *3.4 *1.6 52.9 Elbow Wrist *40 *42 2  Elbow 11.1 11.4 0.3 2.7 1.0 63.0       Ulnar Motor (Abd Dig Min)  31.8C  Wrist 3.0   6.0   B Elbow Wrist 63    B Elbow 6.2   5.4   A Elbow B Elbow 91    A Elbow 7.3   5.1            Waveforms:

## 2023-02-17 NOTE — Progress Notes (Signed)
Kenneth Ross - 87 y.o. male MRN [id number]  Date of birth: [birth year]  Office Visit Note: Visit Date: 02/11/2023 PCP: Tonia Ghent, MD Referred by: Donella Stade, PA-C  Subjective: Chief Complaint  Patient presents with   Left Hand - Numbness, Pain, Weakness   HPI:  Kenneth Ross is a 87 y.o. male who comes in today at the request of Annie Main, PA-C for evaluation and management of chronic, worsening and severe pain, numbness and tingling in the Left upper extremities.  Patient is Right hand dominant.  He initially saw Dr. Marlou Sa in January for right shoulder pain found to have arthritis and likely chronic rotator cuff tear.  On follow-up with Annie Main, PA-C he noted that he had 2 to 3 years of worsening left hand pain with numbness and weakness.  He located this to the radial digits the thumb index and middle fingers.  He was having difficulty with manipulating small objects and dropping and weakness.  He does have frank numbness but also tingling.  He also endorses some symptoms on the right but he reports the left side is worse than the right in terms of symptomatology.  He reports this is probably been going on for quite a while off and on.  He reports prior carpal tunnel injections by Dr. Lorelei Pont.  He did get a lot of relief with the injections.  He has tried night splints and anti-inflammatory medications and this does not seem to help at all.  He does not really endorse pain down the arms although he does have some pain in the joints in the shoulder etc. as mentioned above.  Some neck pain at times but no again frank radicular symptoms.  He is not diabetic.  He has not had prior electrodiagnostic studies.     Review of Systems  Musculoskeletal:  Positive for joint pain.  Neurological:  Positive for tingling and weakness.  All other systems reviewed and are negative.  Otherwise per HPI.  Assessment & Plan: Visit Diagnoses:    ICD-10-CM   1. Paresthesia of  skin  R20.2 NCV with EMG (electromyography)    2. Weakness  R53.1 NCV with EMG (electromyography)    3. Cervicalgia  M54.2     4. 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  Unfortunately has been getting carpal tunnel injections instead of further evaluation for this.  He does have osteoarthritic changes as well and I think he is getting some pain from a musculotendinous standpoint.  Electrodiagnostic study performed today.  The above electrodiagnostic study is ABNORMAL and reveals evidence of a severe bilateral median nerve entrapment at the wrist (carpal tunnel syndrome) affecting sensory and motor components. The lesion is characterized by sensory and motor demyelination with evidence of axonal injury.   There is no significant electrodiagnostic evidence of any other focal nerve entrapment, brachial plexopathy or cervical radiculopathy.   Recommendations: 1.  Follow-up with referring physician. 2.  Continue current management of symptoms. 3.  Suggest surgical evaluation.  Meds & Orders: No orders of the defined types were placed in this encounter.   Orders Placed This Encounter  Procedures   NCV with EMG (electromyography)    Follow-up: Return for Sara Lee, PA-C.   Procedures: No procedures performed  EMG & NCV Findings: Evaluation of  the left median motor and the right median motor nerves showed prolonged distal onset latency (L5.9, R6.2 ms), reduced amplitude (L3.4, R1.6 mV), and decreased conduction velocity (Elbow-Wrist, L40, R42 m/s).  The left median (across palm) sensory nerve showed prolonged distal peak latency (Wrist, 10.2 ms) and prolonged distal peak latency (Palm, 9.5 ms).  The right median (across palm) sensory nerve showed prolonged distal peak latency (Wrist, 5.3 ms).  All remaining nerves (as indicated  in the following tables) were within normal limits.  All left vs. right side differences were within normal limits.    Needle evaluation of the right abductor pollicis brevis muscle showed increased insertional activity, slightly increased spontaneous activity, and diminished recruitment.  All remaining muscles (as indicated in the following table) showed no evidence of electrical instability.    Impression: The above electrodiagnostic study is ABNORMAL and reveals evidence of a severe bilateral median nerve entrapment at the wrist (carpal tunnel syndrome) affecting sensory and motor components. The lesion is characterized by sensory and motor demyelination with evidence of axonal injury.   There is no significant electrodiagnostic evidence of any other focal nerve entrapment, brachial plexopathy or cervical radiculopathy.   Recommendations: 1.  Follow-up with referring physician. 2.  Continue current management of symptoms. 3.  Suggest surgical evaluation.  ___________________________ Laurence Spates FAAPMR Board Certified, American Board of Physical Medicine and Rehabilitation    Nerve Conduction Studies Anti Sensory Summary Table   Stim Site NR Peak (ms) Norm Peak (ms) P-T Amp (V) Norm P-T Amp Site1 Site2 Delta-P (ms) Dist (cm) Vel (m/s) Norm Vel (m/s)  Left Median Acr Palm Anti Sensory (2nd Digit)  30.2C  Wrist    *10.2 <3.6 11.8 >10 Wrist Palm 0.7 0.0    Palm    *9.5 <2.0 7.2         Right Median Acr Palm Anti Sensory (2nd Digit)  31.5C  Wrist    *5.3 <3.6 23.3 >10 Wrist Palm 3.4 0.0    Palm    1.9 <2.0 24.5         Left Radial Anti Sensory (Base 1st Digit)  31.6C  Wrist    2.2 <3.1 34.4  Wrist Base 1st Digit 2.2 0.0    Left Ulnar Anti Sensory (5th Digit)  31.3C  Wrist    3.7 <3.7 23.4 >15.0 Wrist 5th Digit 3.7 14.0 38 >38   Motor Summary Table   Stim Site NR Onset (ms) Norm Onset (ms) O-P Amp (mV) Norm O-P Amp Site1 Site2 Delta-0 (ms) Dist (cm) Vel (m/s) Norm Vel (m/s)   Left Median Motor (Abd Poll Brev)  31.7C  Wrist    *5.9 <4.2 *3.4 >5 Elbow Wrist 5.2 21.0 *40 >50  Elbow    11.1  2.7         Right Median Motor (Abd Poll Brev)  31.3C  Wrist    *6.2 <4.2 *1.6 >5 Elbow Wrist 5.2 22.0 *42 >50  Elbow    11.4  1.0         Left Ulnar Motor (Abd Dig Min)  31.8C  Wrist    3.0 <4.2 6.0 >3 B Elbow Wrist 3.2 20.0 63 >53  B Elbow    6.2  5.4  A Elbow B Elbow 1.1 10.0 91 >53  A Elbow    7.3  5.1          EMG   Side Muscle Nerve Root Ins Act Fibs Psw Amp Dur Poly Recrt Int Fraser Din Comment  Left Abd [place]  Brev Median C8-T1 Nml Nml Nml Nml Nml 0 Nml Nml   Left 1stDorInt Ulnar C8-T1 Nml Nml Nml Nml Nml 0 Nml Nml   Left PronatorTeres Median C6-7 Nml Nml Nml Nml Nml 0 Nml Nml   Right Abd Poll Brev Median C8-T1 *CRD *1+ *1+ Nml Nml 0 *Reduced Nml     Nerve Conduction Studies Anti Sensory Left/Right Comparison   Stim Site L Lat (ms) R Lat (ms) L-R Lat (ms) L Amp (V) R Amp (V) L-R Amp (%) Site1 Site2 L Vel (m/s) R Vel (m/s) L-R Vel (m/s)  Median Acr Palm Anti Sensory (2nd Digit)  30.2C  Wrist *10.2 *5.3 4.9 11.8 23.3 49.4 Wrist Palm     Palm *9.5 1.9 7.6 7.2 24.5 70.6       Radial Anti Sensory (Base 1st Digit)  31.6C  Wrist 2.2   34.4   Wrist Base 1st Digit     Ulnar Anti Sensory (5th Digit)  31.3C  Wrist 3.7   23.4   Wrist 5th Digit 38     Motor Left/Right Comparison   Stim Site L Lat (ms) R Lat (ms) L-R Lat (ms) L Amp (mV) R Amp (mV) L-R Amp (%) Site1 Site2 L Vel (m/s) R Vel (m/s) L-R Vel (m/s)  Median Motor (Abd Poll Brev)  31.7C  Wrist *5.9 *6.2 0.3 *3.4 *1.6 52.9 Elbow Wrist *40 *42 2  Elbow 11.1 11.4 0.3 2.7 1.0 63.0       Ulnar Motor (Abd Dig Min)  31.8C  Wrist 3.0   6.0   B Elbow Wrist 63    B Elbow 6.2   5.4   A Elbow B Elbow 91    A Elbow 7.3   5.1            Waveforms:                 Clinical History: No specialty comments available.     Objective:  VS:  HT:    WT:   BMI:     BP:   HR: bpm  TEMP: ( )  RESP:  Physical  Exam Vitals and nursing note reviewed.  Constitutional:      General: He is not in acute distress.    Appearance: Normal appearance. He is well-developed.  HENT:     Head: Normocephalic and atraumatic.  Eyes:     Conjunctiva/sclera: Conjunctivae normal.     Pupils: Pupils are equal, round, and reactive to light.  Cardiovascular:     Rate and Rhythm: Normal rate.     Pulses: Normal pulses.     Heart sounds: Normal heart sounds.  Pulmonary:     Effort: Pulmonary effort is normal. No respiratory distress.  Musculoskeletal:        General: No tenderness.     Cervical back: Normal range of motion and neck supple. No rigidity.     Right lower leg: No edema.     Left lower leg: No edema.     Comments: Inspection reveals no atrophy of the bilateral APB or FDI or hand intrinsics. There is no swelling, color changes, allodynia or dystrophic changes. There is 5 out of 5 strength in the bilateral wrist extension, finger abduction and long finger flexion. There is decreased sensation to light touch in the bilateral median nerve distributions.. There is a negative Froment's test bilaterally. There is a negative Tinel's test at the bilateral wrist and elbow. There is a positive Phalen's test bilaterally. There is  a negative Hoffmann's test bilaterally.  Skin:    General: Skin is warm and dry.     Findings: No erythema or rash.  Neurological:     General: No focal deficit present.     Mental Status: He is alert and oriented to person, place, and time.     Cranial Nerves: No cranial nerve deficit.     Sensory: Sensory deficit present.     Motor: Weakness present. No abnormal muscle tone.     Coordination: Coordination normal.     Gait: Gait normal.  Psychiatric:        Mood and Affect: Mood normal.        Behavior: Behavior normal.        Thought Content: Thought content normal.      Imaging: No results found.

## 2023-02-18 ENCOUNTER — Encounter: Payer: Self-pay | Admitting: Surgical

## 2023-02-18 ENCOUNTER — Ambulatory Visit (INDEPENDENT_AMBULATORY_CARE_PROVIDER_SITE_OTHER): Payer: HMO | Admitting: Surgical

## 2023-02-18 DIAGNOSIS — G5602 Carpal tunnel syndrome, left upper limb: Secondary | ICD-10-CM

## 2023-02-18 NOTE — Progress Notes (Signed)
Follow-up Office Visit Note   Patient: Kenneth Ross           Date of Birth: 1936-05-03           MRN: [id number] Visit Date: 02/18/2023 Requested by: Tonia Ghent, MD [place],  [place] [id number] PCP: Tonia Ghent, MD  Subjective: Chief Complaint  Patient presents with   Left Hand - Numbness    HPI: Kenneth Ross is a 87 y.o. male who returns to the office for follow-up visit.    Plan at last visit was: Patient is an 87 year old male who presents for evaluation of left hand pain.  Has had symptoms for 2 to 3 years with associated numbness and tingling in the median nerve distribution.  Has had several carpal tunnel injections with good relief for several months but he would like definitive management of his suspected carpal tunnel syndrome.  Splints are no longer helping as much as they once were.  We discussed options available patient and he would like to proceed with planning for surgery.  We discussed the process and what carpal tunnel release entails.  Discussed the surgery in depth.  Discussed the recovery timeframe as well as the risks and benefits of procedure including but not limited to the risk of infection, decreased grip strength, nerve/blood vessel damage, stiffness after surgery, incomplete resolution of symptoms, medical complication from surgery.  Plan to obtain nerve conduction study of the left upper extremity to evaluate for carpal tunnel syndrome.  Follow-up after to review results with Dr. Marlou Sa and posterior surgery.   Since then, patient notes he has no changes to his symptoms.  Left hand bothers him more than the right hand.  He has nerve conduction study done by Dr. Ernestina Patches on 02/11/2023 that demonstrates severe bilateral carpal tunnel syndrome, left greater than right.  He would like to proceed with surgery.  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  No projects going on right now that he cannot delay.  He denies any significant cardiac history.  No blood thinner use.              ROS: All systems reviewed are negative as they relate to the chief complaint within the history of present illness.  Patient denies fevers or chills.  Assessment & Plan: Visit Diagnoses:  1. Carpal tunnel syndrome, left upper limb     Plan: Kenneth Ross is a 87 y.o. male who returns to the office for follow-up visit for left hand pain.  Plan from last visit was noted above in HPI.  They now return with continued left hand pain with some right hand pain as well.  He has symptoms consistent with carpal tunnel syndrome with pain in the median nerve distribution as well as numbness and tingling and paresthesias.  He has had great relief from prior carpal tunnel injections but he would like definitive management of this problem.  Nerve conduction study shows severe bilateral carpal tunnel syndrome.  He would like to have the left addressed first.  We discussed surgery in depth as well as the recovery timeframe and the risks and benefits of the procedure including but not limited to the risk of infection, wound complication, continued symptoms, neurovascular injury, medical complication from surgery, need for revision surgery in the future.  He understands the risk of development of pillar  pain after surgery as well as the likely reduced grip strength he will have for several months after surgery.  After discussion, he would like to proceed with surgery soon as possible.  Plan to post him for left carpal tunnel release.  He would like to have the right carpal tunnel addressed in the future after he recovers from the left.  Follow-up after procedure.  Follow-Up Instructions: No follow-ups on file.   Orders:  No orders of the defined types were placed in this encounter.  No orders of the defined types were placed in this encounter.     Procedures: No  procedures performed   Clinical Data: No additional findings.  Objective: Vital Signs: There were no vitals taken for this visit.  Physical Exam:  Constitutional: Patient appears well-developed HEENT:  Head: Normocephalic Eyes:EOM are normal Neck: Normal range of motion Cardiovascular: Normal rate Pulmonary/chest: Effort normal Neurologic: Patient is alert Skin: Skin is warm Psychiatric: Patient has normal mood and affect  Ortho Exam: Ortho exam demonstrates left hand with 2+ radial pulse of the left upper extremity.  Intact EPL, FPL, finger abduction, grip strength testing rated 5/5.  No cellulitis or skin changes noted.  Mild thenar atrophy noted.  Positive Tinel sign .  Positive Durkan sign.  Specialty Comments:  No specialty comments available.  Imaging: No results found.   PMFS History: Patient Active Problem List   Diagnosis Date Noted   Right shoulder pain 12/14/2022   Sleep apnea    Acute renal failure superimposed on stage 3a chronic kidney disease (HCC)    Rash    H. 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   FOOT PAIN, RIGHT 02/18/2008   Renal cell cancer, left ([place]) 07/22/2007   Past Medical History:  Diagnosis Date   Allergic rhinitis, seasonal    Allergy    Bell's palsy    with L eyelid droop at baseline as of 2017   Blood transfusion without reported diagnosis    Cancer of kidney ([place])    Cataract    CKD (chronic kidney disease), stage III ([place])    followed by pcp   ED (erectile dysfunction)    GERD (gastroesophageal  reflux disease)    occasional take tums   History of renal cell carcinoma    1986  s/p  left nephrectomy   Hyperlipidemia    Idiopathic scoliosis    Pneumonia    Prostate cancer [facility]) urologist-- dr dahlstedt/  oncologist-- dr [name]   dx 09-07-2019--- Stage T1c,  Gleason 4+3   Seasonal allergies    Sleep apnea    Cpap   Solitary right kidney    s/p  left nephrecotmy 1986   Tachycardia followed by pcp   HR 100 @ pcp office --- pt referred to cardiology for consult w/ dr t. Rockey Situ note in epic 12-14-2017, by pcp (dr Damita Dunnings) no work up done,  pt has lopressor as needed if heart rate is elevated as pt feels appropiate    Family History  Problem Relation Age of Onset   Cancer Mother        breast  Stroke Mother        mets stroke  58   Fibromyalgia Mother    Arthritis Mother    Gallbladder disease Mother    Hypertension Mother    Heart disease Father    Cancer Father        lung and prostate metastases   Stroke Father        mini strokes   Prostate cancer Father    Gallbladder disease Father    Hypertension Father    Liver cancer Sister    Lupus Sister    Hepatitis C Sister    Diabetes Sister    GER disease Sister    Esophageal cancer Paternal Aunt    Cancer Maternal Grandmother        type unknown   Gallbladder disease Paternal Grandmother    Stroke Paternal Grandfather    COPD Daughter    Bronchitis Daughter    Liver disease Daughter    Colon cancer Cousin    Rectal cancer Cousin    Bladder Cancer Cousin    Colon polyps Neg Hx    Stomach cancer Neg Hx     Past Surgical History:  Procedure Laterality Date   CATARACT EXTRACTION Left 06/10/2018   Dr. Valetta Close   CATARACT EXTRACTION Right 2021   CATARACT EXTRACTION W/ INTRAOCULAR LENS IMPLANT Left    LAMINECTOMY  1993   L3-5   NEPHRECTOMY Left 10/1985   RADIOACTIVE SEED IMPLANT N/A 10/21/2019   Procedure: RADIOACTIVE SEED IMPLANT/BRACHYTHERAPY IMPLANT;  Surgeon: Franchot Gallo, MD;  Location: [facility];  Service: Urology;  Laterality: N/A;  65 seeds implanted   SHOULDER OPEN ROTATOR CUFF REPAIR  12/1998   Right   SHOULDER OPEN ROTATOR CUFF REPAIR Bilateral 08/2001   SPACE OAR INSTILLATION N/A 10/21/2019   Procedure: SPACE OAR INSTILLATION;  Surgeon: Franchot Gallo, MD;  Location: [facility];  Service: Urology;  Laterality: N/A;   Social History   Occupational History   Occupation: Retired  Tobacco Use   Smoking status: Never   Smokeless tobacco: Never  Vaping Use   Vaping Use: Never used  Substance and Sexual Activity   Alcohol use: No    Alcohol/week: 0.0 standard drinks of alcohol   Drug use: Never   Sexual activity: Yes

## 2023-02-25 DIAGNOSIS — G4733 Obstructive sleep apnea (adult) (pediatric): Secondary | ICD-10-CM | POA: Diagnosis not present

## 2023-02-27 DIAGNOSIS — G5602 Carpal tunnel syndrome, left upper limb: Secondary | ICD-10-CM | POA: Diagnosis not present

## 2023-02-27 HISTORY — PX: CARPAL TUNNEL RELEASE: SHX101

## 2023-03-02 ENCOUNTER — Telehealth: Payer: Self-pay

## 2023-03-02 ENCOUNTER — Other Ambulatory Visit: Payer: Self-pay | Admitting: Surgical

## 2023-03-02 MED ORDER — TRAMADOL HCL 50 MG PO TABS: 50.0000 mg | ORAL_TABLET | Freq: Four times a day (QID) | ORAL | 0 refills | Status: DC | PRN

## 2023-03-02 NOTE — Telephone Encounter (Signed)
Sent in Tramadol RX

## 2023-03-02 NOTE — Telephone Encounter (Signed)
Called and advised pt. He stated understanding. Stated all he has is tylenol for pain and would like something [facility]. Please advise

## 2023-03-02 NOTE — Telephone Encounter (Signed)
Patient states that he had carpal tunnel release Friday with Dr.Dean. He states that his bandages to coming off and not sure what Dr.Dean wants him to do. [phone]

## 2023-03-02 NOTE — Telephone Encounter (Signed)
Perla for just Band-Aid coverage?

## 2023-03-02 NOTE — Telephone Encounter (Signed)
Called and advised pt.

## 2023-03-02 NOTE — Telephone Encounter (Signed)
Just cover with bandaid and keep it dry, no getting it wet at all

## 2023-03-09 ENCOUNTER — Ambulatory Visit (INDEPENDENT_AMBULATORY_CARE_PROVIDER_SITE_OTHER): Payer: HMO | Admitting: Surgical

## 2023-03-09 DIAGNOSIS — G5602 Carpal tunnel syndrome, left upper limb: Secondary | ICD-10-CM

## 2023-03-13 ENCOUNTER — Encounter: Payer: Self-pay | Admitting: Surgical

## 2023-03-13 ENCOUNTER — Ambulatory Visit (INDEPENDENT_AMBULATORY_CARE_PROVIDER_SITE_OTHER): Payer: HMO | Admitting: Surgical

## 2023-03-13 DIAGNOSIS — G5602 Carpal tunnel syndrome, left upper limb: Secondary | ICD-10-CM

## 2023-03-13 NOTE — Progress Notes (Signed)
Post-Op Visit Note   Patient: Kenneth Ross           Date of Birth: 03-23-1936           MRN: [id number] Visit Date: 03/13/2023 PCP: Joaquim Nam, MD   Assessment & Plan:  Chief Complaint:  Chief Complaint  Patient presents with   Left Hand - Routine Post Op    Suture removal of left CTR   Visit Diagnoses:  1. Carpal tunnel syndrome, left upper limb     Plan: Patient is an 87 year old male who presents s/p left carpal tunnel release.  He is here today for suture removal.  He is about 2 weeks out.  No complaints of fever, chills, increased pain.  No drainage.  He does have some persistent numbness and tingling in the median nerve distribution but this is stable compared with last appointment.  Suture removed and replaced with Steri-Strips today.  He still has intact EPL, FPL, finger abduction, finger adduction, grip strength, thumb abduction.  Palpable radial pulse of the operative extremity rated 2+.  He is okay to use the arm a little bit when showering but still avoid lifting.  No submersion underwater.  Follow-up in 3 weeks for clinical recheck with Dr. August Saucer.  Follow-Up Instructions: No follow-ups on file.   Orders:  No orders of the defined types were placed in this encounter.  No orders of the defined types were placed in this encounter.   Imaging: No results found.  PMFS History: Patient Active Problem List   Diagnosis Date Noted   Right shoulder pain 12/14/2022   Sleep apnea    Acute renal failure superimposed on stage 3a chronic kidney disease    Rash    H. 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   FOOT PAIN, RIGHT 02/18/2008   Renal cell cancer, left 07/22/2007   Past Medical History:  Diagnosis Date   Allergic rhinitis, seasonal    Allergy    Bell's palsy    with L eyelid droop at baseline as of 2017   Blood transfusion without reported diagnosis    Cancer of kidney (HCC)    Cataract    CKD (chronic kidney disease), stage III (HCC)    followed by pcp   ED (erectile dysfunction)    GERD (gastroesophageal reflux disease)    occasional take tums   History of renal cell carcinoma    1986  s/p  left nephrectomy   Hyperlipidemia    Idiopathic scoliosis    Pneumonia    Prostate cancer [facility]) urologist-- dr dahlstedt/  oncologist-- dr Kathrynn Running   dx 09-07-2019--- Stage T1c,  Gleason 4+3   Seasonal allergies    Sleep apnea    Cpap   Solitary right kidney    s/p  left nephrecotmy 1986   Tachycardia followed by pcp   HR 100 @ pcp office --- pt referred to cardiology for consult w/ dr t. Mariah Milling note in epic 12-14-2017, by pcp (dr Para March) no work up done,  pt has lopressor as needed if heart rate is elevated as pt feels appropiate  Family History  Problem Relation Age of Onset   Cancer Mother        breast   Stroke Mother        mets stroke  19   Fibromyalgia Mother    Arthritis Mother    Gallbladder disease Mother    Hypertension Mother    Heart disease Father    Cancer Father        lung and prostate metastases   Stroke Father        mini strokes   Prostate cancer Father    Gallbladder disease Father    Hypertension Father    Liver cancer Sister    Lupus Sister    Hepatitis C Sister    Diabetes Sister    GER disease Sister    Esophageal cancer Paternal Aunt    Cancer Maternal Grandmother        type unknown   Gallbladder disease Paternal Grandmother    Stroke Paternal Grandfather    COPD Daughter    Bronchitis Daughter    Liver disease Daughter    Colon cancer Cousin    Rectal cancer  Cousin    Bladder Cancer Cousin    Colon polyps Neg Hx    Stomach cancer Neg Hx     Past Surgical History:  Procedure Laterality Date   CATARACT EXTRACTION Left 06/10/2018   Dr. Cathey Endow   CATARACT EXTRACTION Right 2021   CATARACT EXTRACTION W/ INTRAOCULAR LENS IMPLANT Left    LAMINECTOMY  1993   L3-5   NEPHRECTOMY Left 10/1985   RADIOACTIVE SEED IMPLANT N/A 10/21/2019   Procedure: RADIOACTIVE SEED IMPLANT/BRACHYTHERAPY IMPLANT;  Surgeon: Marcine Matar, MD;  Location: [facility];  Service: Urology;  Laterality: N/A;  65 seeds implanted   SHOULDER OPEN ROTATOR CUFF REPAIR  12/1998   Right   SHOULDER OPEN ROTATOR CUFF REPAIR Bilateral 08/2001   SPACE OAR INSTILLATION N/A 10/21/2019   Procedure: SPACE OAR INSTILLATION;  Surgeon: Marcine Matar, MD;  Location: [facility];  Service: Urology;  Laterality: N/A;   Social History   Occupational History   Occupation: Retired  Tobacco Use   Smoking status: Never   Smokeless tobacco: Never  Vaping Use   Vaping Use: Never used  Substance and Sexual Activity   Alcohol use: No    Alcohol/week: 0.0 standard drinks of alcohol   Drug use: Never   Sexual activity: Yes

## 2023-03-15 NOTE — Progress Notes (Signed)
Post-Op Visit Note   Patient: Kenneth Ross           Date of Birth: [birth year]           MRN: [id number] Visit Date: 03/09/2023 PCP: Joaquim Nam, MD   Assessment & Plan:  Chief Complaint:  Chief Complaint  Patient presents with   Left Hand - Routine Post Op    LEFT CTR (surgery date 02-27-23)   Visit Diagnoses: No diagnosis found.  Plan: Patient is an 87 year old male who presents s/p left carpal tunnel release on 02/27/2023.  Patient is about 10 days postop.  He has some continued pain and numbness that has not significantly changed compared with prior to surgery.  He still continues to have fair amount of tingling in the median nerve distribution.  He denies any fevers or chills or any drainage from the incision.  Intact EPL, thumb abduction, FPL, grip strength, finger abduction, wrist extension.  Incision looks to be healing well with sutures intact.  No evidence of infection or dehiscence.  2+ radial pulse of the operative extremity.  Plan is to continue with no lifting with the operative hand.  Return on Friday for suture removal.  Discussed with Pedram that due to the severity of his carpal tunnel syndrome, not surprising that his symptoms have persisted and it can take several months for his symptoms to improve after carpal tunnel release.  He understands and he will comply with no lifting.  Avoid submersion.  Follow-up in 4 days.  Follow-Up Instructions: No follow-ups on file.   Orders:  No orders of the defined types were placed in this encounter.  No orders of the defined types were placed in this encounter.   Imaging: No results found.  PMFS History: Patient Active Problem List   Diagnosis Date Noted   Right shoulder pain 12/14/2022   Sleep apnea    Acute renal failure superimposed on stage 3a chronic kidney disease    Rash    H. 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   FOOT PAIN, RIGHT 02/18/2008   Renal cell cancer, left 07/22/2007   Past Medical History:  Diagnosis Date   Allergic rhinitis, seasonal    Allergy    Bell's palsy    with L eyelid droop at baseline as of 2017   Blood transfusion without reported diagnosis    Cancer of kidney    Cataract    CKD (chronic kidney disease), stage III    followed by pcp   ED (erectile dysfunction)    GERD (gastroesophageal reflux disease)    occasional take tums   History of renal cell carcinoma    1986  s/p  left nephrectomy   Hyperlipidemia    Idiopathic scoliosis    Pneumonia    Prostate cancer urologist-- dr dahlstedt/  oncologist-- dr Kathrynn Running   dx 09-07-2019--- Stage T1c,  Gleason 4+3   Seasonal allergies    Sleep apnea    Cpap   Solitary right kidney    s/p  left nephrecotmy 1986   Tachycardia followed by pcp   HR 100 @  pcp office --- pt referred to cardiology for consult w/ dr t. Mariah Milling note in epic 12-14-2017, by pcp (dr Para March) no work up done,  pt has lopressor as needed if heart rate is elevated as pt feels appropiate    Family History  Problem Relation Age of Onset   Cancer Mother        breast   Stroke Mother        mets stroke  43   Fibromyalgia Mother    Arthritis Mother    Gallbladder disease Mother    Hypertension Mother    Heart disease Father    Cancer Father        lung and prostate metastases   Stroke Father        mini strokes   Prostate cancer Father    Gallbladder disease Father    Hypertension Father    Liver cancer Sister    Lupus Sister    Hepatitis C Sister    Diabetes Sister    GER disease Sister     Esophageal cancer Paternal Aunt    Cancer Maternal Grandmother        type unknown   Gallbladder disease Paternal Grandmother    Stroke Paternal Grandfather    COPD Daughter    Bronchitis Daughter    Liver disease Daughter    Colon cancer Cousin    Rectal cancer Cousin    Bladder Cancer Cousin    Colon polyps Neg Hx    Stomach cancer Neg Hx     Past Surgical History:  Procedure Laterality Date   CATARACT EXTRACTION Left 06/10/2018   Dr. Cathey Endow   CATARACT EXTRACTION Right 2021   CATARACT EXTRACTION W/ INTRAOCULAR LENS IMPLANT Left    LAMINECTOMY  1993   L3-5   NEPHRECTOMY Left 10/1985   RADIOACTIVE SEED IMPLANT N/A 10/21/2019   Procedure: RADIOACTIVE SEED IMPLANT/BRACHYTHERAPY IMPLANT;  Surgeon: Marcine Matar, MD;  Location: [facility];  Service: Urology;  Laterality: N/A;  65 seeds implanted   SHOULDER OPEN ROTATOR CUFF REPAIR  12/1998   Right   SHOULDER OPEN ROTATOR CUFF REPAIR Bilateral 08/2001   SPACE OAR INSTILLATION N/A 10/21/2019   Procedure: SPACE OAR INSTILLATION;  Surgeon: Marcine Matar, MD;  Location: [facility];  Service: Urology;  Laterality: N/A;   Social History   Occupational History   Occupation: Retired  Tobacco Use   Smoking status: Never   Smokeless tobacco: Never  Vaping Use   Vaping Use: Never used  Substance and Sexual Activity   Alcohol use: No    Alcohol/week: 0.0 standard drinks of alcohol   Drug use: Never   Sexual activity: Yes

## 2023-03-27 DIAGNOSIS — G4733 Obstructive sleep apnea (adult) (pediatric): Secondary | ICD-10-CM | POA: Diagnosis not present

## 2023-04-01 ENCOUNTER — Ambulatory Visit (INDEPENDENT_AMBULATORY_CARE_PROVIDER_SITE_OTHER): Payer: HMO | Admitting: Orthopedic Surgery

## 2023-04-01 DIAGNOSIS — G5602 Carpal tunnel syndrome, left upper limb: Secondary | ICD-10-CM

## 2023-04-03 NOTE — Progress Notes (Signed)
Post-Op Visit Note   Patient: Kenneth Ross           Date of Birth: 1936/01/14           MRN: [id number] Visit Date: 04/01/2023 PCP: Joaquim Nam, MD   Assessment & Plan:  Chief Complaint:  Chief Complaint  Patient presents with   Left Hand - Follow-up    LEFT CTR (surgery date 02-27-23)   Visit Diagnoses:  1. Carpal tunnel syndrome, left upper limb     Plan: Dorinda Hill underwent left carpal tunnel release 02/27/2023.  Still reports some occasional left thumb pain.  Also little bit of tip numbness and tingling.  On examination he has good grip strength and intact abductor pollicis brevis strength.  Still some subjective paresthesias in the median distribution.  Patient did have severe carpal tunnel syndrome and I think this will continue to improve.  6-week return for final recheck.  Follow-Up Instructions: No follow-ups on file.   Orders:  No orders of the defined types were placed in this encounter.  No orders of the defined types were placed in this encounter.   Imaging: No results found.  PMFS History: Patient Active Problem List   Diagnosis Date Noted   Right shoulder pain 12/14/2022   Sleep apnea    Acute renal failure superimposed on stage 3a chronic kidney disease (HCC)    Rash    H. 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   FOOT PAIN, RIGHT 02/18/2008   Renal cell cancer, left (HCC) 07/22/2007   Past Medical History:  Diagnosis Date   Allergic rhinitis, seasonal    Allergy    Bell's palsy    with L eyelid droop at  baseline as of 2017   Blood transfusion without reported diagnosis    Cancer of kidney (HCC)    Cataract    CKD (chronic kidney disease), stage III (HCC)    followed by pcp   ED (erectile dysfunction)    GERD (gastroesophageal reflux disease)    occasional take tums   History of renal cell carcinoma    1986  s/p  left nephrectomy   Hyperlipidemia    Idiopathic scoliosis    Pneumonia    Prostate cancer [facility]) urologist-- dr dahlstedt/  oncologist-- dr Kathrynn Running   dx 09-07-2019--- Stage T1c,  Gleason 4+3   Seasonal allergies    Sleep apnea    Cpap   Solitary right kidney    s/p  left nephrecotmy 1986   Tachycardia followed by pcp   HR 100 @ pcp office --- pt referred to cardiology for consult w/ dr t. Mariah Milling note in epic 12-14-2017, by pcp (dr Para March) no work up done,  pt has lopressor as needed if heart rate is elevated as pt feels appropiate    Family History  Problem Relation Age of Onset   Cancer Mother        breast   Stroke Mother        mets stroke  41   Fibromyalgia Mother    Arthritis Mother    Gallbladder disease Mother  Hypertension Mother    Heart disease Father    Cancer Father        lung and prostate metastases   Stroke Father        mini strokes   Prostate cancer Father    Gallbladder disease Father    Hypertension Father    Liver cancer Sister    Lupus Sister    Hepatitis C Sister    Diabetes Sister    GER disease Sister    Esophageal cancer Paternal Aunt    Cancer Maternal Grandmother        type unknown   Gallbladder disease Paternal Grandmother    Stroke Paternal Grandfather    COPD Daughter    Bronchitis Daughter    Liver disease Daughter    Colon cancer Cousin    Rectal cancer Cousin    Bladder Cancer Cousin    Colon polyps Neg Hx    Stomach cancer Neg Hx     Past Surgical History:  Procedure Laterality Date   CATARACT EXTRACTION Left 06/10/2018   Dr. Cathey Endow   CATARACT EXTRACTION Right 2021   CATARACT EXTRACTION W/ INTRAOCULAR LENS  IMPLANT Left    LAMINECTOMY  1993   L3-5   NEPHRECTOMY Left 10/1985   RADIOACTIVE SEED IMPLANT N/A 10/21/2019   Procedure: RADIOACTIVE SEED IMPLANT/BRACHYTHERAPY IMPLANT;  Surgeon: Marcine Matar, MD;  Location: [facility];  Service: Urology;  Laterality: N/A;  65 seeds implanted   SHOULDER OPEN ROTATOR CUFF REPAIR  12/1998   Right   SHOULDER OPEN ROTATOR CUFF REPAIR Bilateral 08/2001   SPACE OAR INSTILLATION N/A 10/21/2019   Procedure: SPACE OAR INSTILLATION;  Surgeon: Marcine Matar, MD;  Location: [facility];  Service: Urology;  Laterality: N/A;   Social History   Occupational History   Occupation: Retired  Tobacco Use   Smoking status: Never   Smokeless tobacco: Never  Vaping Use   Vaping Use: Never used  Substance and Sexual Activity   Alcohol use: No    Alcohol/week: 0.0 standard drinks of alcohol   Drug use: Never   Sexual activity: Yes

## 2023-04-04 ENCOUNTER — Encounter: Payer: Self-pay | Admitting: Orthopedic Surgery

## 2023-04-28 DIAGNOSIS — G4733 Obstructive sleep apnea (adult) (pediatric): Secondary | ICD-10-CM | POA: Diagnosis not present

## 2023-05-13 ENCOUNTER — Ambulatory Visit (INDEPENDENT_AMBULATORY_CARE_PROVIDER_SITE_OTHER): Payer: HMO | Admitting: Orthopedic Surgery

## 2023-05-13 ENCOUNTER — Encounter: Payer: Self-pay | Admitting: Orthopedic Surgery

## 2023-05-13 DIAGNOSIS — G5602 Carpal tunnel syndrome, left upper limb: Secondary | ICD-10-CM

## 2023-05-13 NOTE — Progress Notes (Signed)
Post-Op Visit Note   Patient: Kenneth Ross           Date of Birth: Dec 30, 1935           MRN: [id number] Visit Date: 05/13/2023 PCP: Joaquim Nam, MD   Assessment & Plan:  Chief Complaint:  Chief Complaint  Patient presents with   Other    LEFT CTR (surgery date 02-27-23)    Visit Diagnoses:  1. Carpal tunnel syndrome, left upper limb     Plan: Offie is an 87 year old patient who is now about 3 months out left carpal tunnel release.  Still is having some paresthesias in the hand.  Overall he states he is about 10% better.  On examination he has bilateral mild abductor pollicis brevis wasting but the nerve itself is functional.  Wrist range of motion intact.  Ultrasound imaging demonstrates slight increase in girth of the left median nerve compared to the right.  There is measurements were 0.56 on the right hand 0.67 on the left.  Plan at this time is continue to observe.  He did have severe carpal tunnel syndrome and I think it is taking a little bit longer to recover.  If he is not 30 to 40% better in 2 months we could consider repeat EMG study/nerve conduction study testing.  Follow-Up Instructions: No follow-ups on file.   Orders:  No orders of the defined types were placed in this encounter.  No orders of the defined types were placed in this encounter.   Imaging: No results found.  PMFS History: Patient Active Problem List   Diagnosis Date Noted   Right shoulder pain 12/14/2022   Sleep apnea    Acute renal failure superimposed on stage 3a chronic kidney disease (HCC)    Rash    H. 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   FOOT PAIN, RIGHT 02/18/2008   Renal cell cancer, left (HCC) 07/22/2007   Past Medical History:  Diagnosis Date   Allergic rhinitis, seasonal    Allergy    Bell's palsy    with L eyelid droop at baseline as of 2017   Blood transfusion without reported diagnosis    Cancer of kidney (HCC)    Cataract    CKD (chronic kidney disease), stage III (HCC)    followed by pcp   ED (erectile dysfunction)    GERD (gastroesophageal reflux disease)    occasional take tums   History of renal cell carcinoma    1986  s/p  left nephrectomy   Hyperlipidemia    Idiopathic scoliosis    Pneumonia    Prostate cancer [facility]) urologist-- dr dahlstedt/  oncologist-- dr Kathrynn Running   dx 09-07-2019--- Stage T1c,  Gleason 4+3   Seasonal allergies    Sleep apnea    Cpap   Solitary right kidney    s/p  left nephrecotmy 1986   Tachycardia followed by pcp   HR 100 @ pcp office --- pt referred to cardiology for consult w/ dr t. Mariah Milling note in epic 12-14-2017, by pcp (dr Para March) no work up done,  pt has lopressor  as needed if heart rate is elevated as pt feels appropiate    Family History  Problem Relation Age of Onset   Cancer Mother        breast   Stroke Mother        mets stroke  54   Fibromyalgia Mother    Arthritis Mother    Gallbladder disease Mother    Hypertension Mother    Heart disease Father    Cancer Father        lung and prostate metastases   Stroke Father        mini strokes   Prostate cancer Father    Gallbladder disease Father    Hypertension Father    Liver cancer Sister    Lupus Sister    Hepatitis C Sister    Diabetes Sister    GER disease Sister    Esophageal cancer Paternal Aunt    Cancer Maternal Grandmother        type unknown   Gallbladder disease Paternal Grandmother    Stroke Paternal Grandfather    COPD Daughter    Bronchitis Daughter    Liver disease  Daughter    Colon cancer Cousin    Rectal cancer Cousin    Bladder Cancer Cousin    Colon polyps Neg Hx    Stomach cancer Neg Hx     Past Surgical History:  Procedure Laterality Date   CATARACT EXTRACTION Left 06/10/2018   Dr. Cathey Endow   CATARACT EXTRACTION Right 2021   CATARACT EXTRACTION W/ INTRAOCULAR LENS IMPLANT Left    LAMINECTOMY  1993   L3-5   NEPHRECTOMY Left 10/1985   RADIOACTIVE SEED IMPLANT N/A 10/21/2019   Procedure: RADIOACTIVE SEED IMPLANT/BRACHYTHERAPY IMPLANT;  Surgeon: Marcine Matar, MD;  Location[facility];  Service: Urology;  Laterality: N/A;  65 seeds implanted   SHOULDER OPEN ROTATOR CUFF REPAIR  12/1998   Right   SHOULDER OPEN ROTATOR CUFF REPAIR Bilateral 08/2001   SPACE OAR INSTILLATION N/A 10/21/2019   Procedure: SPACE OAR INSTILLATION;  Surgeon: Marcine Matar, MD;  Location: [facility];  Service: Urology;  Laterality: N/A;   Social History   Occupational History   Occupation: Retired  Tobacco Use   Smoking status: Never   Smokeless tobacco: Never  Vaping Use   Vaping Use: Never used  Substance and Sexual Activity   Alcohol use: No    Alcohol/week: 0.0 standard drinks of alcohol   Drug use: Never   Sexual activity: Yes

## 2023-05-28 DIAGNOSIS — G4733 Obstructive sleep apnea (adult) (pediatric): Secondary | ICD-10-CM | POA: Diagnosis not present

## 2023-06-01 IMAGING — US US ABDOMEN LIMITED
1 series · 15 of 25 positions shown · non-contrast
Comparison: None.

CLINICAL DATA: Abdominal pain after meals

EXAM:
ULTRASOUND ABDOMEN LIMITED RIGHT UPPER QUADRANT

[Series 1: us abdomen limited ruq mc & wl · 15 of 34 slices shown]
[im 1/34]
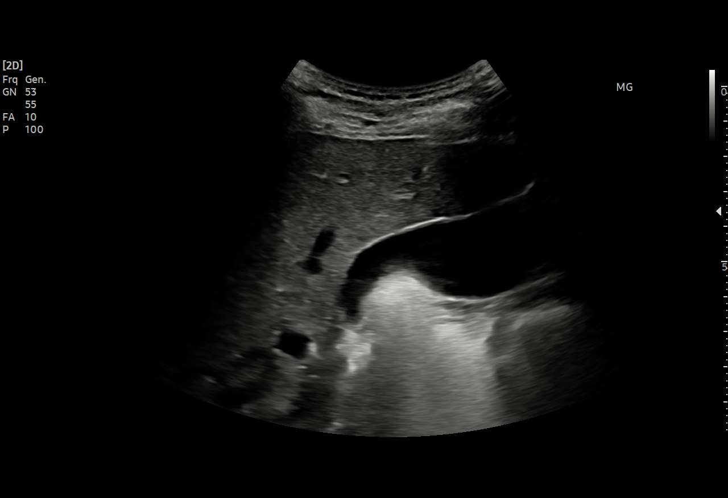
[im 3/34]
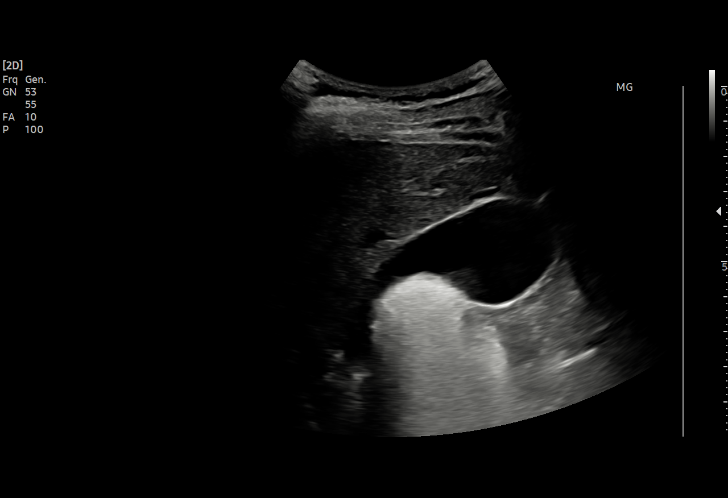
[im 6/34]
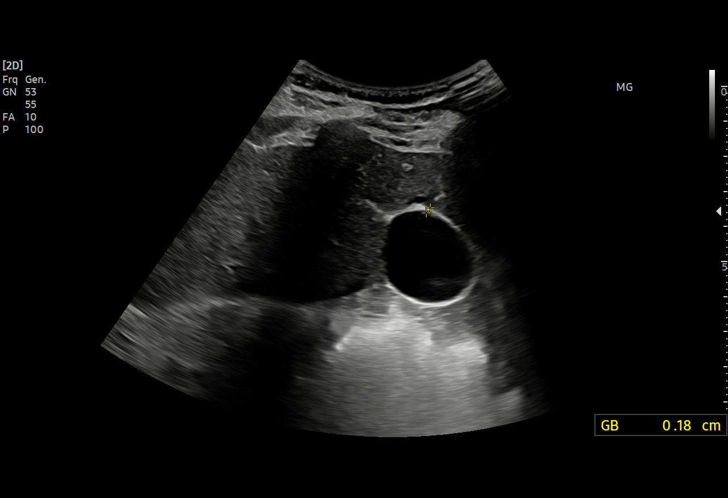
[im 7/34]
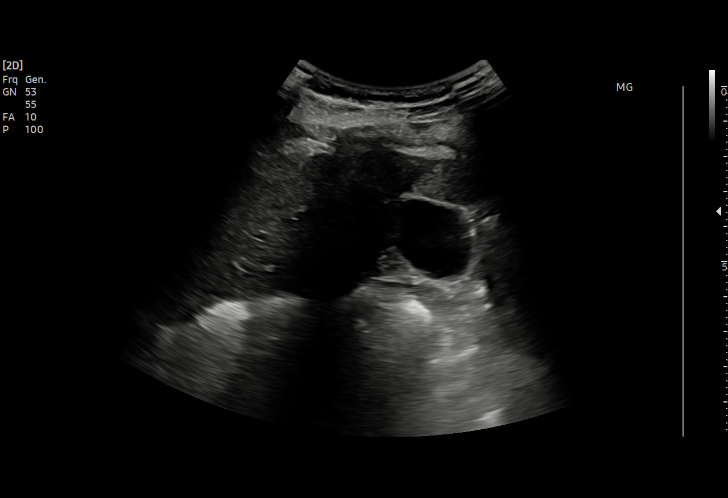
[im 10/34]
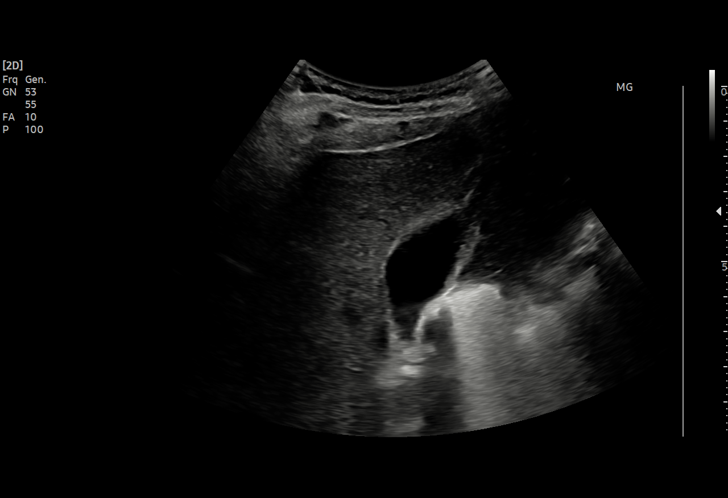
[im 13/34]
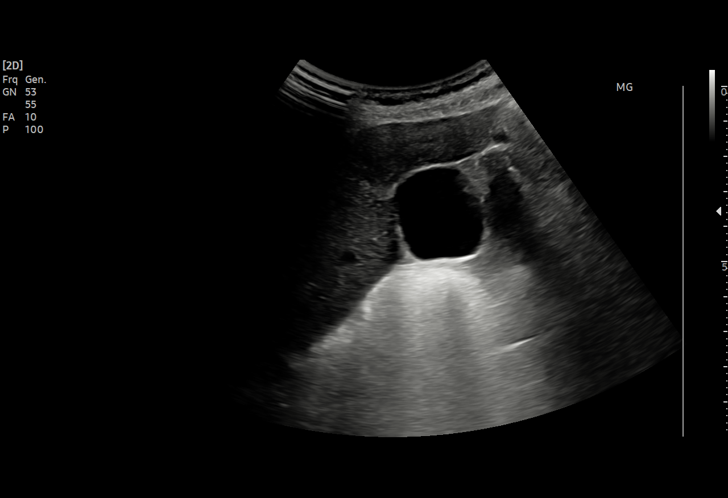
[im 14/34]
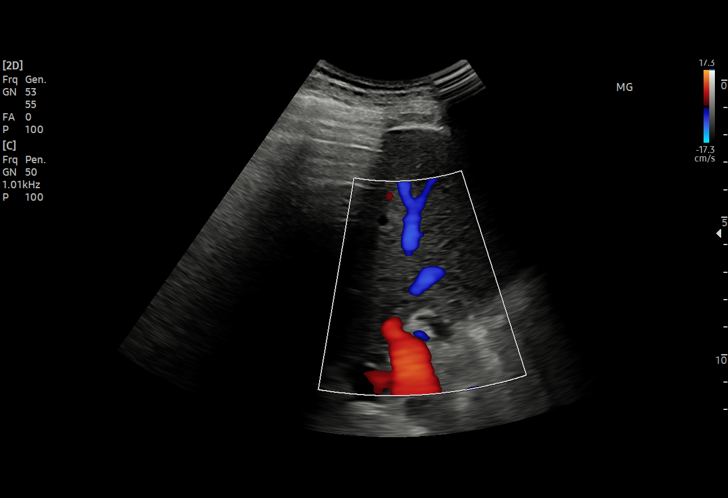
[im 17/34]
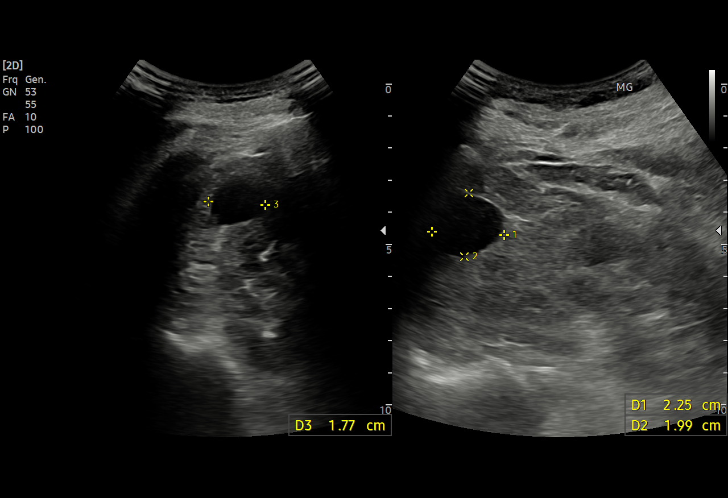
[im 20/34]
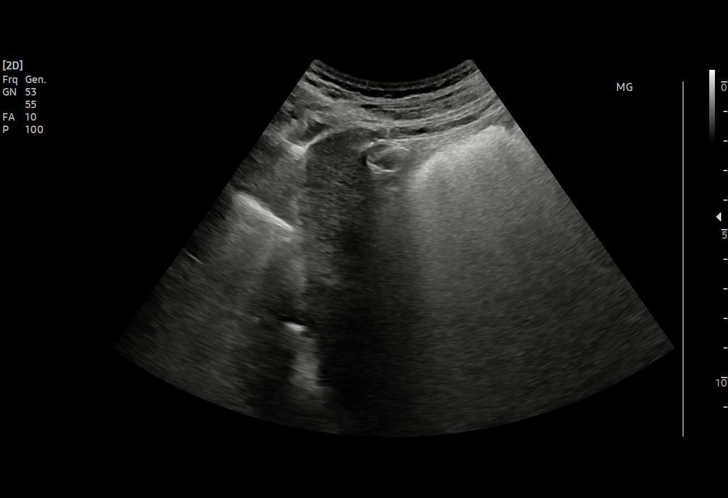
[im 21/34]
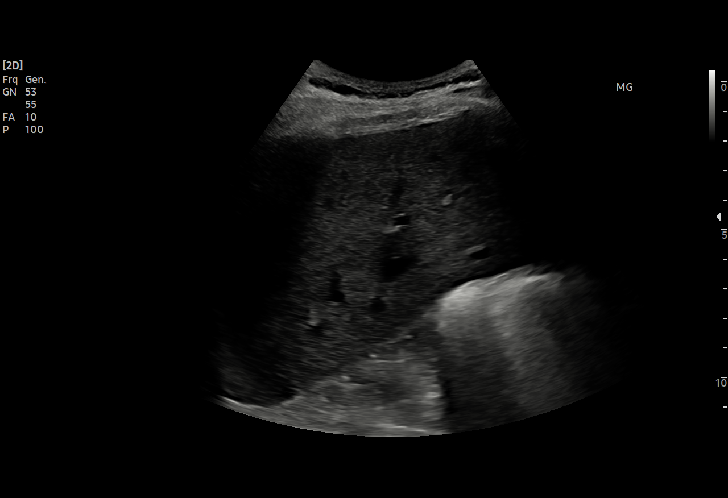
[im 24/34]
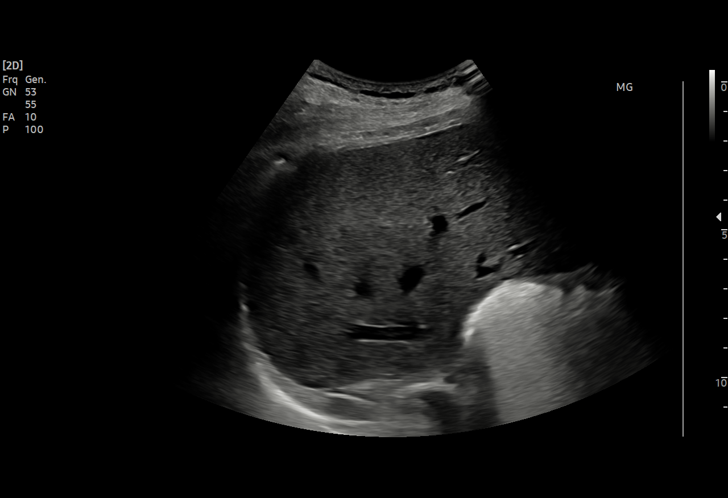
[im 27/34]
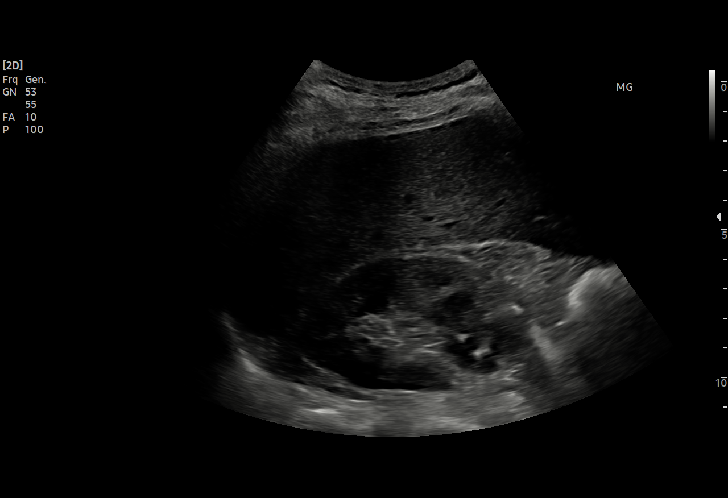
[im 28/34]
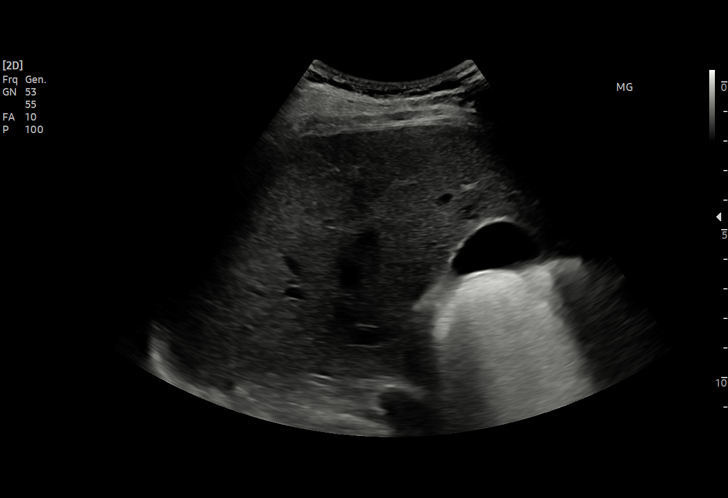
[im 31/34]
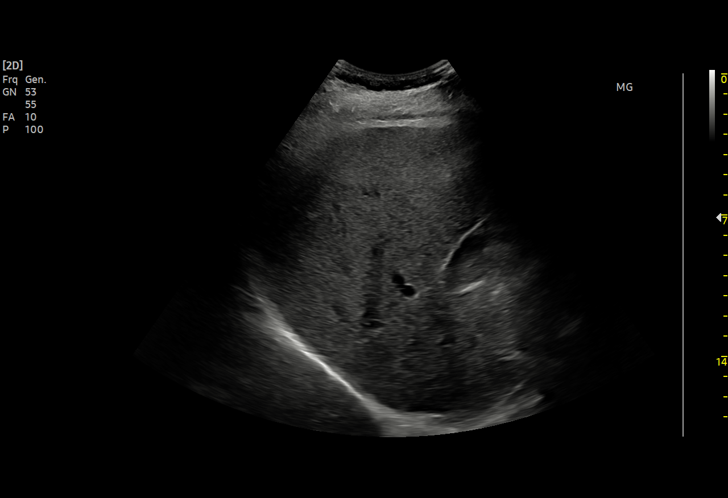
[im 34/34]
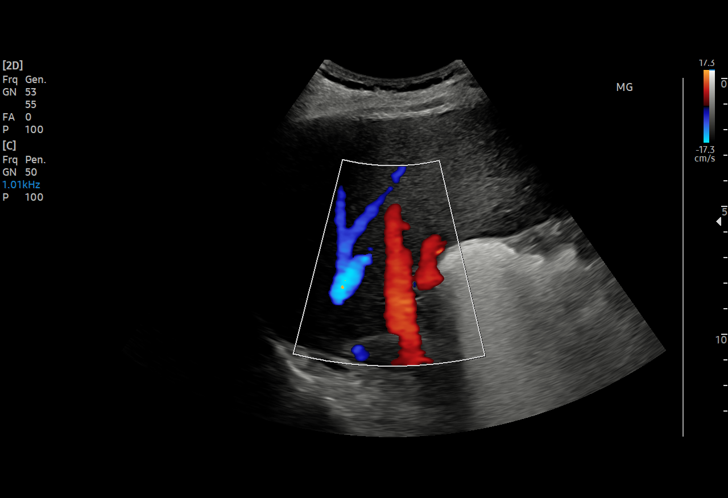

[15 of 25 positions shown; findings below may reference images not displayed]

FINDINGS: Gallbladder:

There is layering sludge in the gallbladder. There are no shadowing
stones. There is no gallbladder wall thickening or pericholecystic
fluid.

Common bile duct:

Diameter: 6 mm

Liver:

No focal lesion identified. Within normal limits in parenchymal
echogenicity. Portal vein is patent on color Doppler imaging with
normal direction of blood flow towards the liver.

Other: A simple right renal cyst is incidentally noted. No specific
imaging follow-up is required.
IMPRESSION: Small amount of sludge in the gallbladder. No shadowing stones or
evidence of acute cholecystitis. No acute findings.

## 2023-06-30 DIAGNOSIS — G4733 Obstructive sleep apnea (adult) (pediatric): Secondary | ICD-10-CM | POA: Diagnosis not present

## 2023-07-15 ENCOUNTER — Encounter: Payer: Self-pay | Admitting: Orthopedic Surgery

## 2023-07-15 ENCOUNTER — Ambulatory Visit (INDEPENDENT_AMBULATORY_CARE_PROVIDER_SITE_OTHER): Payer: HMO | Admitting: Surgical

## 2023-07-15 DIAGNOSIS — Z9889 Other specified postprocedural states: Secondary | ICD-10-CM

## 2023-07-15 DIAGNOSIS — G5602 Carpal tunnel syndrome, left upper limb: Secondary | ICD-10-CM

## 2023-07-15 NOTE — Progress Notes (Signed)
Post-Op Visit Note   Patient: Kenneth Ross           Date of Birth: [birth year]           MRN: [id number] Visit Date: 07/15/2023 PCP: Joaquim Nam, MD   Assessment & Plan:  Chief Complaint:  Chief Complaint  Patient presents with   Left Hand - Follow-up    LEFT CTR (surgery date 02-27-23)   Visit Diagnoses:  1. S/P carpal tunnel release   2. Carpal tunnel syndrome, left upper limb     Plan: Patient is a 87 year old male who presents s/p left carpal tunnel release on 02/27/2023.  He is doing much better compared with his last office visit on 05/13/2023.  He reports that now he feels his hand is about 75 to 80% improved compared with preop.  Still has some numbness and tingling at the tips of his thumb, index, middle finger but overall he is very pleased with how his hand is doing.  No issues with grip strength.  No evidence of infection.  Incision is well-healed.  He has intact EPL, FPL, finger abduction, grip strength testing, thumb adduction without weakness.  Plan is follow-up with the office as needed.  Would expect that some of the residual pain may continue to improve until about a year or a year and a half out from surgery.  Recommended he return if he has any return of symptoms or any major concerns.    Also recommended that he return if he would like to have right carpal tunnel release.  Has a nerve conduction study from 02/11/2023 demonstrating severe bilateral median nerve entrapment at the wrist.  Currently he is not ready for right carpal tunnel release but he will keep [place] posted and he can call when he would like to schedule that.  Follow-Up Instructions: No follow-ups on file.   Orders:  No orders of the defined types were placed in this encounter.  No orders of the defined types were placed in this encounter.   Imaging: No results found.  PMFS History: Patient Active Problem List   Diagnosis Date Noted   Right shoulder pain 12/14/2022   Sleep apnea     Acute renal failure superimposed on stage 3a chronic kidney disease (HCC)    Rash    H. 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   FOOT PAIN, RIGHT 02/18/2008   Renal cell cancer, left (HCC) 07/22/2007   Past Medical History:  Diagnosis Date   Allergic rhinitis, seasonal    Allergy    Bell's palsy    with L eyelid droop at baseline as of 2017   Blood transfusion without reported diagnosis    Cancer of kidney (HCC)    Cataract    CKD (chronic kidney disease), stage III (HCC)    followed by pcp   ED (erectile dysfunction)    GERD (gastroesophageal reflux disease)    occasional take tums   History of renal cell carcinoma    1986  s/p  left nephrectomy   Hyperlipidemia    Idiopathic scoliosis    Pneumonia  Prostate cancer [facility]) urologist-- dr dahlstedt/  oncologist-- dr Kathrynn Running   dx 09-07-2019--- Stage T1c,  Gleason 4+3   Seasonal allergies    Sleep apnea    Cpap   Solitary right kidney    s/p  left nephrecotmy 1986   Tachycardia followed by pcp   HR 100 @ pcp office --- pt referred to cardiology for consult w/ dr t. Mariah Milling note in epic 12-14-2017, by pcp (dr Para March) no work up done,  pt has lopressor as needed if heart rate is elevated as pt feels appropiate    Family History  Problem Relation Age of Onset   Cancer Mother        breast   Stroke Mother        mets stroke  73   Fibromyalgia Mother    Arthritis Mother    Gallbladder disease Mother    Hypertension Mother    Heart disease Father    Cancer Father        lung and prostate  metastases   Stroke Father        mini strokes   Prostate cancer Father    Gallbladder disease Father    Hypertension Father    Liver cancer Sister    Lupus Sister    Hepatitis C Sister    Diabetes Sister    GER disease Sister    Esophageal cancer Paternal Aunt    Cancer Maternal Grandmother        type unknown   Gallbladder disease Paternal Grandmother    Stroke Paternal Grandfather    COPD Daughter    Bronchitis Daughter    Liver disease Daughter    Colon cancer Cousin    Rectal cancer Cousin    Bladder Cancer Cousin    Colon polyps Neg Hx    Stomach cancer Neg Hx     Past Surgical History:  Procedure Laterality Date   CATARACT EXTRACTION Left 06/10/2018   Dr. Cathey Endow   CATARACT EXTRACTION Right 2021   CATARACT EXTRACTION W/ INTRAOCULAR LENS IMPLANT Left    LAMINECTOMY  1993   L3-5   NEPHRECTOMY Left 10/1985   RADIOACTIVE SEED IMPLANT N/A 10/21/2019   Procedure: RADIOACTIVE SEED IMPLANT/BRACHYTHERAPY IMPLANT;  Surgeon: Marcine Matar, MD;  Location: [facility];  Service: Urology;  Laterality: N/A;  65 seeds implanted   SHOULDER OPEN ROTATOR CUFF REPAIR  12/1998   Right   SHOULDER OPEN ROTATOR CUFF REPAIR Bilateral 08/2001   SPACE OAR INSTILLATION N/A 10/21/2019   Procedure: SPACE OAR INSTILLATION;  Surgeon: Marcine Matar, MD;  Location: [facility];  Service: Urology;  Laterality: N/A;   Social History   Occupational History   Occupation: Retired  Tobacco Use   Smoking status: Never   Smokeless tobacco: Never  Vaping Use   Vaping status: Never Used  Substance and Sexual Activity   Alcohol use: No    Alcohol/week: 0.0 standard drinks of alcohol   Drug use: Never   Sexual activity: Yes

## 2023-07-30 DIAGNOSIS — G4733 Obstructive sleep apnea (adult) (pediatric): Secondary | ICD-10-CM | POA: Diagnosis not present

## 2023-08-06 ENCOUNTER — Ambulatory Visit (INDEPENDENT_AMBULATORY_CARE_PROVIDER_SITE_OTHER): Payer: HMO

## 2023-08-06 DIAGNOSIS — Z0000 Encounter for general adult medical examination without abnormal findings: Secondary | ICD-10-CM | POA: Diagnosis not present

## 2023-08-06 NOTE — Progress Notes (Signed)
Subjective:   Kenneth Ross is a 87 y.o. male who presents for Medicare Annual/Subsequent preventive examination.  Visit Complete: Virtual  I connected with  Kenneth Ross on 08/06/23 by a audio enabled telemedicine application and verified that I am speaking with the correct person using two identifiers.  Patient Location: Home  Provider Location: Office/Clinic  I discussed the limitations of evaluation and management by telemedicine. The patient expressed understanding and agreed to proceed.  Vital Signs: Unable to obtain new vitals due to this being a telehealth visit.  Review of Systems     Cardiac Risk Factors include: advanced age (>17men, >24 women);male gender     Objective:    Today's Vitals   08/06/23 1426  PainSc: 4    There is no height or weight on file to calculate BMI.     08/06/2023    2:31 PM 07/22/2022   10:21 AM 05/16/2022    1:00 PM 05/16/2022    8:05 AM 11/11/2019    1:18 PM 10/21/2019    9:54 AM 09/13/2019    8:35 AM  Advanced Directives  Does Patient Have a Medical Advance Directive? Yes Yes Yes Yes Yes Yes Yes  Type of Estate agent of [place];Living will Living will Healthcare Power of [place];Living will Healthcare Power of [place];Living will Healthcare Power of [place];Living will Healthcare Power of [place];Living will Living will;Healthcare Power of Attorney  Does patient want to make changes to medical advance directive?  Yes (MAU/Ambulatory/Procedural Areas - Information given) No - Patient declined   No - Patient declined No - Patient declined  Copy of Healthcare Power of Attorney in Chart? No - copy requested    No - copy requested No - copy requested No - copy requested  Would patient like information on creating a medical advance directive?       No - Patient declined    Current Medications (verified) Outpatient Encounter Medications as of 08/06/2023  Medication Sig   aspirin 81 MG tablet Take 81 mg by  mouth daily.     Azelastine HCl 0.15 % SOLN Place 1 spray into the nose 2 (two) times daily as needed (Allergies).   Calcium 500-125 MG-UNIT TABS Take 1 tablet by mouth daily.     calcium carbonate (TUMS - DOSED IN MG ELEMENTAL CALCIUM) 500 MG chewable tablet Chew 1 tablet by mouth daily as needed for indigestion or heartburn.   Cholecalciferol (VITAMIN D) 1000 UNITS capsule Take 1,000 Units by mouth daily.     diphenhydrAMINE (BENADRYL) 50 MG tablet Take 0.5 tablets (25 mg total) by mouth every 8 (eight) hours as needed for itching or allergies.   fish oil-omega-3 fatty acids 1000 MG capsule Take 1 g by mouth daily.   loratadine (CLARITIN) 10 MG tablet Take 10 mg by mouth daily.    metoprolol tartrate (LOPRESSOR) 25 MG tablet Take 0.5-1 tablets (12.5-25 mg total) by mouth 2 (two) times daily as needed (if pulse is persistently above 100).   Multiple Vitamin (MULTIVITAMIN) capsule Take 1 capsule by mouth daily.     sildenafil (REVATIO) 20 MG tablet TAKE 3 TO 5 TABLETS BY MOUTH ONCE DAILY AS NEEDED   simvastatin (ZOCOR) 40 MG tablet Take 1 tablet (40 mg total) by mouth every evening.   traMADol (ULTRAM) 50 MG tablet Take 1 tablet (50 mg total) by mouth every 6 (six) hours as needed.   No facility-administered encounter medications on file as of 08/06/2023.    Allergies (verified) Ibuprofen,  Iodinated contrast media, and Talicia [amoxicill-rifabutin-omeprazole]   History: Past Medical History:  Diagnosis Date   Allergic rhinitis, seasonal    Allergy    Bell's palsy    with L eyelid droop at baseline as of 2017   Blood transfusion without reported diagnosis    Cancer of kidney (HCC)    Cataract    CKD (chronic kidney disease), stage III (HCC)    followed by pcp   ED (erectile dysfunction)    GERD (gastroesophageal reflux disease)    occasional take tums   History of renal cell carcinoma    1986  s/p  left nephrectomy   Hyperlipidemia    Idiopathic scoliosis    Pneumonia    Prostate  cancer [facility]) urologist-- dr dahlstedt/  oncologist-- dr Kathrynn Running   dx 09-07-2019--- Stage T1c,  Gleason 4+3   Seasonal allergies    Sleep apnea    Cpap   Solitary right kidney    s/p  left nephrecotmy 1986   Tachycardia followed by pcp   HR 100 @ pcp office --- pt referred to cardiology for consult w/ dr t. Mariah Milling note in epic 12-14-2017, by pcp (dr Para March) no work up done,  pt has lopressor as needed if heart rate is elevated as pt feels appropiate   Past Surgical History:  Procedure Laterality Date   CARPAL TUNNEL RELEASE Left 02/27/2023   CATARACT EXTRACTION Left 06/10/2018   Dr. Cathey Endow   CATARACT EXTRACTION Right 2021   CATARACT EXTRACTION W/ INTRAOCULAR LENS IMPLANT Left    LAMINECTOMY  1993   L3-5   NEPHRECTOMY Left 10/1985   RADIOACTIVE SEED IMPLANT N/A 10/21/2019   Procedure: RADIOACTIVE SEED IMPLANT/BRACHYTHERAPY IMPLANT;  Surgeon: Marcine Matar, MD;  Location: [facility];  Service: Urology;  Laterality: N/A;  65 seeds implanted   SHOULDER OPEN ROTATOR CUFF REPAIR  12/1998   Right   SHOULDER OPEN ROTATOR CUFF REPAIR Bilateral 08/2001   SPACE OAR INSTILLATION N/A 10/21/2019   Procedure: SPACE OAR INSTILLATION;  Surgeon: Marcine Matar, MD;  Location: [facility];  Service: Urology;  Laterality: N/A;   Family History  Problem Relation Age of Onset   Cancer Mother        breast   Stroke Mother        mets stroke  20   Fibromyalgia Mother    Arthritis Mother    Gallbladder disease Mother    Hypertension Mother    Heart disease Father    Cancer Father        lung and prostate metastases   Stroke Father        mini strokes   Prostate cancer Father    Gallbladder disease Father    Hypertension Father    Liver cancer Sister    Lupus Sister    Hepatitis C Sister    Diabetes Sister    GER disease Sister    Esophageal cancer Paternal Aunt    Cancer Maternal Grandmother        type unknown   Gallbladder disease Paternal  Grandmother    Stroke Paternal Grandfather    COPD Daughter    Bronchitis Daughter    Liver disease Daughter    Colon cancer Cousin    Rectal cancer Cousin    Bladder Cancer Cousin    Colon polyps Neg Hx    Stomach cancer Neg Hx    Social History   Socioeconomic History   Marital status: Married    Spouse name:  Not on file   Number of children: 2   Years of education: Not on file   Highest education level: Not on file  Occupational History   Occupation: Retired  Tobacco Use   Smoking status: Never   Smokeless tobacco: Never  Vaping Use   Vaping status: Never Used  Substance and Sexual Activity   Alcohol use: No    Alcohol/week: 0.0 standard drinks of alcohol   Drug use: Never   Sexual activity: Yes  Other Topics Concern   Not on file  Social History Narrative   Retired from [employer], Curator   Marital Status: Married 08/09/1961, widowed 08-09-17.  Wife died from hodgkin's lymphoma.  She also had h/o colon cancer.     Remarried 05/25/2019.     Children: 2 daughters initially- one was disabled and lived with pt until she died in 08/09/2013   [employer]58-'61, deployed to [place].   Social Determinants of Health   Financial Resource Strain: Low Risk  (08/06/2023)   Overall Financial Resource Strain (CARDIA)    Difficulty of Paying Living Expenses: Not hard at all  Food Insecurity: No Food Insecurity (08/06/2023)   Hunger Vital Sign    Worried About Running Out of Food in the Last Year: Never true    Ran Out of Food in the Last Year: Never true  Transportation Needs: No Transportation Needs (08/06/2023)   PRAPARE - Administrator, Civil Service (Medical): No    Lack of Transportation (Non-Medical): No  Physical Activity: Sufficiently Active (08/06/2023)   Exercise Vital Sign    Days of Exercise per Week: 7 days    Minutes of Exercise per Session: 30 min  Stress: No Stress Concern Present (08/06/2023)   [employer] of Occupational Health - Occupational Stress  Questionnaire    Feeling of Stress : Not at all  Social Connections: Moderately Integrated (08/06/2023)   Social Connection and Isolation Panel [NHANES]    Frequency of Communication with Friends and Family: More than three times a week    Frequency of Social Gatherings with Friends and Family: Once a week    Attends Religious Services: More than 4 times per year    Active Member of [employer] or Organizations: No    Attends Engineer, structural: Never    Marital Status: Married    Tobacco Counseling Counseling given: Not Answered   Clinical Intake:  Pre-visit preparation completed: Yes  Pain : 0-10 Pain Score: 4  Pain Type: Chronic pain Pain Location: Leg Pain Orientation: Right Pain Descriptors / Indicators: Aching Pain Onset: More than a month ago Pain Frequency: Constant     Nutritional Risks: None Diabetes: No  How often do you need to have someone help you when you read instructions, pamphlets, or other written materials from your doctor or pharmacy?: 1 - Never  Interpreter Needed?: No  Information entered by :: NAllen LPN   Activities of Daily Living    08/06/2023    2:27 PM  In your present state of health, do you have any difficulty performing the following activities:  Hearing? 0  Vision? 0  Difficulty concentrating or making decisions? 0  Walking or climbing stairs? 0  Dressing or bathing? 0  Doing errands, shopping? 0  Preparing Food and eating ? N  Using the Toilet? N  In the past six months, have you accidently leaked urine? N  Do you have problems with loss of bowel control? N  Managing your Medications? N  Managing your Finances? N  Housekeeping or managing your Housekeeping? N    Patient Care Team: Joaquim Nam, MD as PCP - General (Family Medicine) Felicita Gage, RN Nurse Navigator as Registered Nurse (Medical Oncology)  Indicate any recent Medical Services you may have received from other than [facility] providers in the past year (date  may be approximate).     Assessment:   This is a routine wellness examination for Zyshonne.  Hearing/Vision screen Hearing Screening - Comments:: Denies hearing issues Vision Screening - Comments:: Regular eye exams, [facility]   Goals Addressed             This Visit's Progress    Patient Stated       08/06/2023, stay healthy       Depression Screen    08/06/2023    2:33 PM 09/11/2022    9:32 AM 09/09/2021    9:22 AM 09/06/2019    9:03 AM 08/30/2018    3:53 PM 08/11/2016   11:11 AM 08/08/2015    3:18 PM  PHQ 2/9 Scores  PHQ - 2 Score 0 0 0 0 0 0 0  PHQ- 9 Score 0  0  0      Fall Risk    08/06/2023    2:32 PM 12/12/2022    8:38 AM 09/11/2022    9:31 AM 09/09/2021    9:21 AM 09/06/2019    9:03 AM  Fall Risk   Falls in the past year? 0 0 0 0 0  Number falls in past yr: 0 0 0 0 0  Injury with Fall? 0 0 0 0 0  Risk for fall due to : Medication side effect History of fall(s) History of fall(s) No Fall Risks   Follow up Falls prevention discussed;Falls evaluation completed Falls evaluation completed Falls evaluation completed Falls evaluation completed     MEDICARE RISK AT HOME: Medicare Risk at Home Any stairs in or around the home?: No If so, are there any without handrails?: No Home free of loose throw rugs in walkways, pet beds, electrical cords, etc?: Yes Adequate lighting in your home to reduce risk of falls?: Yes Life alert?: No Use of a cane, walker or w/c?: No Grab bars in the bathroom?: Yes Shower chair or bench in shower?: No Elevated toilet seat or a handicapped toilet?: Yes  TIMED UP AND GO:  Was the test performed?  No    Cognitive Function:    08/30/2018    3:54 PM  MMSE - Mini Mental State Exam  Orientation to time 5  Orientation to Place 5  Registration 3  Attention/ Calculation 0  Recall 3  Language- name 2 objects 0  Language- repeat 1  Language- follow 3 step command 3  Language- read & follow direction 0  Write a sentence 0  Copy  design 0  Total score 20        08/06/2023    2:33 PM  6CIT Screen  What Year? 0 points  What month? 0 points  What time? 0 points  Count back from 20 0 points  Months in reverse 0 points  Repeat phrase 0 points  Total Score 0 points    Immunizations Immunization History  Administered Date(s) Administered   Fluad Quad(high Dose 65+) 09/06/2020, 09/09/2021, 09/11/2022   Influenza Split 09/04/2011   Influenza Whole 11/13/2006, 09/10/2007, 09/11/2008, 08/20/2010   Influenza, High Dose Seasonal PF 09/15/2017, 08/30/2018, 08/07/2019   Influenza,inj,Quad PF,6+ Mos 08/02/2014, 08/08/2015, 08/11/2016   PFIZER(Purple  Top)SARS-COV-2 Vaccination 12/26/2019, 01/16/2020, 10/02/2020, 08/26/2021   Pneumococcal Conjugate-13 08/08/2015   Pneumococcal Polysaccharide-23 08/02/2004   Td 05/01/1996, 12/01/2005, 05/01/2017   Zoster Recombinant(Shingrix) 09/15/2017, 12/16/2017   Zoster, Live 07/13/2013    TDAP status: Up to date  Flu Vaccine status: Due, Education has been provided regarding the importance of this vaccine. Advised may receive this vaccine at local pharmacy or Health Dept. Aware to provide a copy of the vaccination record if obtained from local pharmacy or Health Dept. Verbalized acceptance and understanding.  Pneumococcal vaccine status: Up to date  Covid-19 vaccine status: Information provided on how to obtain vaccines.   Qualifies for Shingles Vaccine? Yes   Zostavax completed Yes   Shingrix Completed?: Yes  Screening Tests Health Maintenance  Topic Date Due   INFLUENZA VACCINE  07/02/2023   COVID-19 Vaccine (5 - 2023-24 season) 08/02/2023   Medicare Annual Wellness (AWV)  08/05/2024   DTaP/Tdap/Td (4 - Tdap) 05/02/2027   Pneumonia Vaccine 8+ Years old  Completed   Zoster Vaccines- Shingrix  Completed   HPV VACCINES  Aged Out    Health Maintenance  Health Maintenance Due  Topic Date Due   INFLUENZA VACCINE  07/02/2023   COVID-19 Vaccine (5 - 2023-24 season)  08/02/2023    Colorectal cancer screening: No longer required.   Lung Cancer Screening: (Low Dose CT Chest recommended if Age 13-80 years, 20 pack-year currently smoking OR have quit w/in 15years.) does not qualify.   Lung Cancer Screening Referral: no  Additional Screening:  Hepatitis C Screening: does not qualify;   Vision Screening: Recommended annual ophthalmology exams for early detection of glaucoma and other disorders of the eye. Is the patient up to date with their annual eye exam?  Yes  Who is the provider or what is the name of the office in which the patient attends annual eye exams? [facility] If pt is not established with a provider, would they like to be referred to a provider to establish care? No .   Dental Screening: Recommended annual dental exams for proper oral hygiene  Diabetic Foot Exam: n/a  Community Resource Referral / Chronic Care Management: CRR required this visit?  No   CCM required this visit?  No     Plan:     I have personally reviewed and noted the following in the patient's chart:   Medical and social history Use of alcohol, tobacco or illicit drugs  Current medications and supplements including opioid prescriptions. Patient is not currently taking opioid prescriptions. Functional ability and status Nutritional status Physical activity Advanced directives List of other physicians Hospitalizations, surgeries, and ER visits in previous 12 months Vitals Screenings to include cognitive, depression, and falls Referrals and appointments  In addition, I have reviewed and discussed with patient certain preventive protocols, quality metrics, and best practice recommendations. A written personalized care plan for preventive services as well as general preventive health recommendations were provided to patient.     Barb Merino, LPN   0/05/3709   After Visit Summary: (Pick Up) Due to this being a telephonic visit, with patients  personalized plan was offered to patient and patient has requested to Pick up at office.  Nurse Notes: right leg pain

## 2023-08-06 NOTE — Patient Instructions (Signed)
Mr. Kenneth Ross , Thank you for taking time to come for your Medicare Wellness Visit. I appreciate your ongoing commitment to your health goals. Please review the following plan we discussed and let me know if I can assist you in the future.   Referrals/Orders/Follow-Ups/Clinician Recommendations: right leg pain  This is a list of the screening recommended for you and due dates:  Health Maintenance  Topic Date Due   Flu Shot  07/02/2023   COVID-19 Vaccine (5 - 2023-24 season) 08/02/2023   Medicare Annual Wellness Visit  08/05/2024   DTaP/Tdap/Td vaccine (4 - Tdap) 05/02/2027   Pneumonia Vaccine  Completed   Zoster (Shingles) Vaccine  Completed   HPV Vaccine  Aged Out    Advanced directives: (Copy Requested) Please bring a copy of your health care power of attorney and living will to the office to be added to your chart at your convenience.  Next Medicare Annual Wellness Visit scheduled for next year: Yes  insert Preventive Care attachment Insert FALL PREVENTION attachment if needed

## 2023-08-11 IMAGING — DX DG CHEST 1V PORT
1 series · 1 of 1 positions shown · non-contrast
Comparison: Prior chest radiographs 09/22/2019 and earlier.

CLINICAL DATA: Provided history: Questionable sepsis-evaluate for
abnormality. Rash.

EXAM:
PORTABLE CHEST 1 VIEW

[chest ap]
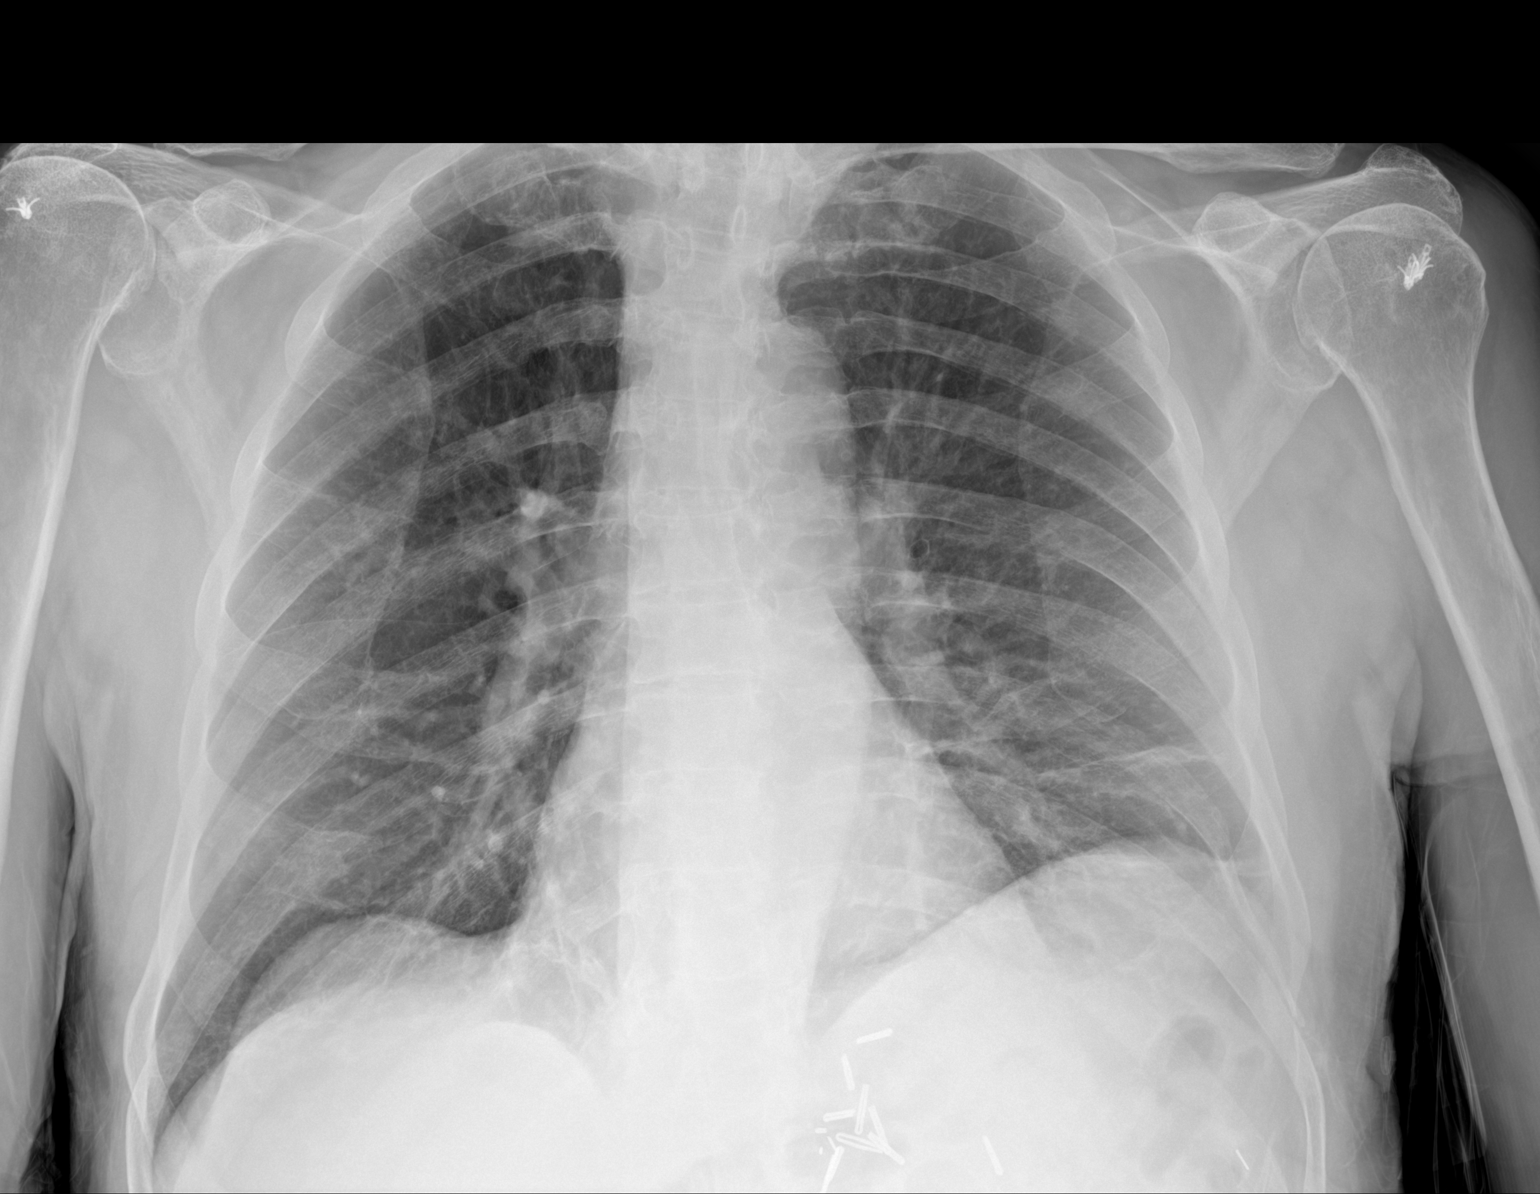

[1 of 1 positions shown; findings below may reference images not displayed]

FINDINGS: Heart size within normal limits. Chronic elevation of the left
hemidiaphragm. Bandlike opacities within the left lung base, likely
reflecting atelectasis. No appreciable airspace consolidation. No
evidence of pleural effusion or pneumothorax. No acute bony
abnormality identified. Soft tissue anchors within the proximal
humeri. Upper thoracic dextrocurvature. Surgical clips within the
upper abdomen.
IMPRESSION: No appreciable airspace consolidation.

Chronic elevation of the left hemidiaphragm.

Bandlike opacities within the left lung base, likely reflecting
atelectasis.

## 2023-08-13 ENCOUNTER — Encounter: Payer: Self-pay | Admitting: Family Medicine

## 2023-08-13 ENCOUNTER — Ambulatory Visit (INDEPENDENT_AMBULATORY_CARE_PROVIDER_SITE_OTHER): Payer: HMO | Admitting: Family Medicine

## 2023-08-13 VITALS — BP 140/62 | HR 74 | Temp 97.5°F | Ht 64.0 in | Wt 142.1 lb

## 2023-08-13 DIAGNOSIS — M79606 Pain in leg, unspecified: Secondary | ICD-10-CM

## 2023-08-13 MED ORDER — TIZANIDINE HCL 2 MG PO TABS: 1.0000 mg | ORAL_TABLET | Freq: Three times a day (TID) | ORAL | 1 refills | Status: DC | PRN

## 2023-08-13 NOTE — Patient Instructions (Signed)
Try sleeping with a pillow under your knees. Try stretching prior to getting out of bed.  Knee to chest.  Use a belt of stretch your calf- loop it over your foot.   Take care.  Glad to see you. Let me know if you want to see PT.  Sedation caution on tizanidine.

## 2023-08-13 NOTE — Progress Notes (Signed)
R leg pain. Larey Seat about 6 months ago.  Fell in the kitchen.  No LOC.  Thought he "pulled" his leg at the time.  No L leg pain.  Using voltaren/aspercreme on the leg.  No knee pain.  Muscle pain in the quad and calf.  More pain after getting up after prolonged sitting. H/o scoliosis at baseline. He didn't think his presenting symptom was a typical sciatica pain.  No pain upon waking but noted as soon as getting out of the bed.  Then less pain with more walking.  No FCNAVD. Not having diffuse aches o/w.     Meds, vitals, and allergies reviewed.   ROS: Per HPI unless specifically indicated in ROS section   Nad Ncat Neck supple, no LA Rrr Ctab B 34 cm calf circumference.   SLR neg.  Chronic postural changes on the back with scoliotic changes. Skin well-perfused.   Abdomen soft.

## 2023-08-16 DIAGNOSIS — M79606 Pain in leg, unspecified: Secondary | ICD-10-CM | POA: Insufficient documentation

## 2023-08-16 NOTE — Assessment & Plan Note (Signed)
No weakness or emergent findings.  Discussed options. Try sleeping with a pillow under the knees. Try stretching prior to getting out of bed.  Discussed stretching, knee to chest.  Use a belt of stretch your calf- loop it over the foot prior to getting out of bed. Update me if needing referral for PT. Sedation caution on tizanidine.  He agrees to plan.

## 2023-08-29 DIAGNOSIS — G4733 Obstructive sleep apnea (adult) (pediatric): Secondary | ICD-10-CM | POA: Diagnosis not present

## 2023-09-06 ENCOUNTER — Other Ambulatory Visit: Payer: Self-pay | Admitting: Family Medicine

## 2023-09-06 DIAGNOSIS — E7800 Pure hypercholesterolemia, unspecified: Secondary | ICD-10-CM

## 2023-09-07 ENCOUNTER — Other Ambulatory Visit (INDEPENDENT_AMBULATORY_CARE_PROVIDER_SITE_OTHER): Payer: HMO

## 2023-09-07 DIAGNOSIS — E7800 Pure hypercholesterolemia, unspecified: Secondary | ICD-10-CM | POA: Diagnosis not present

## 2023-09-07 LAB — COMPREHENSIVE METABOLIC PANEL
ALT: 20 U/L (ref 0–53)
AST: 19 U/L (ref 0–37)
Albumin: 4.4 g/dL (ref 3.5–5.2)
Alkaline Phosphatase: 58 U/L (ref 39–117)
BUN: 35 mg/dL — ABNORMAL HIGH (ref 6–23)
CO2: 29 meq/L (ref 19–32)
Calcium: 9.9 mg/dL (ref 8.4–10.5)
Chloride: 104 meq/L (ref 96–112)
Creatinine, Ser: 1.77 mg/dL — ABNORMAL HIGH (ref 0.40–1.50)
GFR: 34.76 mL/min — ABNORMAL LOW (ref >60.00)
Glucose, Bld: 68 mg/dL — ABNORMAL LOW (ref 70–99)
Potassium: 4.4 meq/L (ref 3.5–5.1)
Sodium: 142 meq/L (ref 135–145)
Total Bilirubin: 0.6 mg/dL (ref 0.2–1.2)
Total Protein: 6.4 g/dL (ref 6.0–8.3)

## 2023-09-07 LAB — CBC WITH DIFFERENTIAL/PLATELET
Basophils Absolute: 0 10*3/uL (ref 0.0–0.1)
Basophils Relative: 0.3 % (ref 0.0–3.0)
Eosinophils Absolute: 0.3 10*3/uL (ref 0.0–0.7)
Eosinophils Relative: 5.4 % — ABNORMAL HIGH (ref 0.0–5.0)
HCT: 40.6 % (ref 39.0–52.0)
Hemoglobin: 13.4 g/dL (ref 13.0–17.0)
Lymphocytes Relative: 29.7 % (ref 12.0–46.0)
Lymphs Abs: 1.6 10*3/uL (ref 0.7–4.0)
MCHC: 33 g/dL (ref 30.0–36.0)
MCV: 96.9 fL (ref 78.0–100.0)
Monocytes Absolute: 0.8 10*3/uL (ref 0.1–1.0)
Monocytes Relative: 14.6 % — ABNORMAL HIGH (ref 3.0–12.0)
Neutro Abs: 2.7 10*3/uL (ref 1.4–7.7)
Neutrophils Relative %: 50 % (ref 43.0–77.0)
Platelets: 219 10*3/uL (ref 150.0–400.0)
RBC: 4.19 Mil/uL — ABNORMAL LOW (ref 4.22–5.81)
RDW: 13.6 % (ref 11.5–15.5)
WBC: 5.3 10*3/uL (ref 4.0–10.5)

## 2023-09-07 LAB — LIPID PANEL
Cholesterol: 148 mg/dL (ref 0–200)
HDL: 36.4 mg/dL — ABNORMAL LOW (ref >39.00)
LDL Cholesterol: 77 mg/dL (ref 0–99)
NonHDL: 111.83
Total CHOL/HDL Ratio: 4
Triglycerides: 175 mg/dL — ABNORMAL HIGH (ref 0.0–149.0)
VLDL: 35 mg/dL (ref 0.0–40.0)

## 2023-09-14 ENCOUNTER — Ambulatory Visit (INDEPENDENT_AMBULATORY_CARE_PROVIDER_SITE_OTHER): Payer: HMO | Admitting: Family Medicine

## 2023-09-14 ENCOUNTER — Encounter: Payer: Self-pay | Admitting: Family Medicine

## 2023-09-14 VITALS — BP 138/72 | HR 83 | Temp 98.0°F | Ht 64.0 in | Wt 143.6 lb

## 2023-09-14 DIAGNOSIS — Z23 Encounter for immunization: Secondary | ICD-10-CM

## 2023-09-14 DIAGNOSIS — M4120 Other idiopathic scoliosis, site unspecified: Secondary | ICD-10-CM | POA: Diagnosis not present

## 2023-09-14 DIAGNOSIS — Z0000 Encounter for general adult medical examination without abnormal findings: Secondary | ICD-10-CM

## 2023-09-14 DIAGNOSIS — N289 Disorder of kidney and ureter, unspecified: Secondary | ICD-10-CM | POA: Diagnosis not present

## 2023-09-14 DIAGNOSIS — Z7189 Other specified counseling: Secondary | ICD-10-CM

## 2023-09-14 DIAGNOSIS — E7800 Pure hypercholesterolemia, unspecified: Secondary | ICD-10-CM | POA: Diagnosis not present

## 2023-09-14 MED ORDER — SIMVASTATIN 40 MG PO TABS: 40.0000 mg | ORAL_TABLET | Freq: Every evening | ORAL | 3 refills | Status: DC

## 2023-09-14 MED ORDER — FLUTICASONE PROPIONATE 50 MCG/ACT NA SUSP: 2.0000 | Freq: Every day | NASAL | 12 refills | Status: AC

## 2023-09-14 MED ORDER — FLUTICASONE PROPIONATE 50 MCG/ACT NA SUSP: 2.0000 | Freq: Every day | NASAL | Status: DC

## 2023-09-14 MED ORDER — AZELASTINE HCL 0.15 % NA SOLN: 1.0000 | Freq: Every day | NASAL | 12 refills | Status: AC

## 2023-09-14 NOTE — Patient Instructions (Signed)
Flu shot today. RSV when possible.  Take care.  Glad to see you.

## 2023-09-14 NOTE — Progress Notes (Unsigned)
Elevated Cholesterol: Using medications without problems: yes Muscle aches: likely not from statin. Diet compliance: yes Exercise: d/w pt.  Limited by back pain at baseline.   CKD at baseline s/p nephrectomy.  Labs d/w pt.   Nsaid cautions d/w pt.    Rare metoprolol use.   He is dealing with likely post nasal gtt.  D/w pt about options.  Has been taking claritin vs zyrtec.  Could use flonase vs astelin.    No recent tramadol or tizanidine use.  Still putting up with back pain.  He is still stretching his R leg since last OV.  He will update me as needed.  Flu shot today Shingles done.   PNA up to date Tetanus 2018 RSV d/w pt.   Covid d/w pt.   Colonoscopy 2023 Prostate cancer screening- per urology.   Advance directive- wife, daughter, and grandson Nicola Girt all equally designated if patient were incapacitated.   Meds, vitals, and allergies reviewed.   ROS: Per HPI unless specifically indicated in ROS section   GEN: nad, alert and oriented HEENT: mucous membranes moist NECK: supple w/o LA CV: rrr.  PULM: ctab, no inc wob ABD: soft, +bs EXT: no edema SKIN: Well-perfused. Scoliotic changes at baseline on the back

## 2023-09-16 NOTE — Assessment & Plan Note (Signed)
No recent tramadol or tizanidine use.  Still putting up with back pain.  He is still stretching his R leg since last OV.  He will update me as needed.

## 2023-09-16 NOTE — Assessment & Plan Note (Signed)
Flu shot today Shingles done.   PNA up to date Tetanus 2018 RSV d/w pt.   Covid d/w pt.   Colonoscopy 2023 Prostate cancer screening- per urology.   Advance directive- wife, daughter, and grandson Nicola Girt all equally designated if patient were incapacitated.

## 2023-09-16 NOTE — Assessment & Plan Note (Signed)
CKD at baseline s/p nephrectomy.  Labs d/w pt.   Nsaid cautions d/w pt.

## 2023-09-16 NOTE — Assessment & Plan Note (Signed)
Advance directive- wife, daughter, and grandson Nicola Girt all equally designated if patient were incapacitated.

## 2023-09-16 NOTE — Assessment & Plan Note (Signed)
Would continue simvastatin at baseline.  Labs discussed with patient.

## 2023-09-28 DIAGNOSIS — G4733 Obstructive sleep apnea (adult) (pediatric): Secondary | ICD-10-CM | POA: Diagnosis not present

## 2023-10-19 DIAGNOSIS — Z961 Presence of intraocular lens: Secondary | ICD-10-CM | POA: Diagnosis not present

## 2023-10-19 DIAGNOSIS — H524 Presbyopia: Secondary | ICD-10-CM | POA: Diagnosis not present

## 2023-10-28 DIAGNOSIS — G4733 Obstructive sleep apnea (adult) (pediatric): Secondary | ICD-10-CM | POA: Diagnosis not present

## 2023-11-27 DIAGNOSIS — G4733 Obstructive sleep apnea (adult) (pediatric): Secondary | ICD-10-CM | POA: Diagnosis not present

## 2023-12-28 DIAGNOSIS — G4733 Obstructive sleep apnea (adult) (pediatric): Secondary | ICD-10-CM | POA: Diagnosis not present

## 2024-01-13 DIAGNOSIS — Z8546 Personal history of malignant neoplasm of prostate: Secondary | ICD-10-CM | POA: Diagnosis not present

## 2024-01-13 DIAGNOSIS — C61 Malignant neoplasm of prostate: Secondary | ICD-10-CM | POA: Diagnosis not present

## 2024-01-22 DIAGNOSIS — R338 Other retention of urine: Secondary | ICD-10-CM | POA: Diagnosis not present

## 2024-01-22 DIAGNOSIS — Z8546 Personal history of malignant neoplasm of prostate: Secondary | ICD-10-CM | POA: Diagnosis not present

## 2024-01-27 DIAGNOSIS — G4733 Obstructive sleep apnea (adult) (pediatric): Secondary | ICD-10-CM | POA: Diagnosis not present

## 2024-02-22 ENCOUNTER — Ambulatory Visit (INDEPENDENT_AMBULATORY_CARE_PROVIDER_SITE_OTHER): Admitting: Family Medicine

## 2024-02-22 ENCOUNTER — Encounter: Payer: Self-pay | Admitting: Family Medicine

## 2024-02-22 VITALS — BP 114/64 | HR 94 | Temp 98.2°F | Ht 64.0 in | Wt 140.1 lb

## 2024-02-22 DIAGNOSIS — J0100 Acute maxillary sinusitis, unspecified: Secondary | ICD-10-CM | POA: Diagnosis not present

## 2024-02-22 DIAGNOSIS — R52 Pain, unspecified: Secondary | ICD-10-CM

## 2024-02-22 DIAGNOSIS — J019 Acute sinusitis, unspecified: Secondary | ICD-10-CM | POA: Insufficient documentation

## 2024-02-22 LAB — POCT INFLUENZA A/B
Influenza A, POC: NEGATIVE
Influenza B, POC: NEGATIVE

## 2024-02-22 MED ORDER — AMOXICILLIN-POT CLAVULANATE 875-125 MG PO TABS: 1.0000 | ORAL_TABLET | Freq: Two times a day (BID) | ORAL | 0 refills | Status: AC

## 2024-02-22 NOTE — Assessment & Plan Note (Addendum)
 Suspect R sided maxillary sinusitis after acute respiratory infection. However he notes significant improvement over last 2-3 days. Discussed possible viral sinusitis and anticipated course of resolution if the case.  WASP for augmentin 7d course printed with instructions when to fill. Discussed possible amox allergy in list - he confirms he is not allergic to amoxicillin but rather likely the Rifabutin component of the [place].  Update if not improving with treatment.

## 2024-02-22 NOTE — Progress Notes (Signed)
 Ph: [phone] Fax: [phone]   Patient ID: Kenneth Ross, male    DOB: 30-Nov-1936, 88 y.o.   MRN: [id number]  This visit was conducted in person.  BP 114/64   Pulse 94   Temp 98.2 F (36.8 C) (Oral)   Ht 5\' 4"  (1.626 m)   Wt 140 lb 2 oz (63.6 kg)   SpO2 95%   BMI 24.05 kg/m    CC: cough, congestion Subjective:   HPI: Kenneth Ross is a 88 y.o. male presenting on 02/22/2024 for Cough (C/o cough, head/chest congestion, chills, HA and body aches. Sxs started 02/10/24. Concerned due to h/o PNA. Pt accompanied by wife, Gigi Gin. )   Almost 2 wk h/o head and chest congestion, HA, body aches and cough. Coughing up mucous productive of colored mucous. Initial fever/chills now resolved.   No ST, ear or tooth pain, abd pain, nausea or diarrhea.   Treating with plain mucinex.   He is overall improving.   H/o pneumonia, last 2008.   Wife also sick currently with sinus infection.     Relevant past medical, surgical, family and social history reviewed and updated as indicated. Interim medical history since our last visit reviewed. Allergies and medications reviewed and updated. Outpatient Medications Prior to Visit  Medication Sig Dispense Refill   aspirin 81 MG tablet Take 81 mg by mouth daily.       Azelastine HCl 0.15 % SOLN Place 1 spray into the nose daily. 11 mL 12   Calcium 500-125 MG-UNIT TABS Take 1 tablet by mouth daily.       calcium carbonate (TUMS - DOSED IN MG ELEMENTAL CALCIUM) 500 MG chewable tablet Chew 1 tablet by mouth daily as needed for indigestion or heartburn.     Cholecalciferol (VITAMIN D) 1000 UNITS capsule Take 1,000 Units by mouth daily.       diphenhydrAMINE (BENADRYL) 50 MG tablet Take 0.5 tablets (25 mg total) by mouth every 8 (eight) hours as needed for itching or allergies. 30 tablet 0   fish oil-omega-3 fatty acids 1000 MG capsule Take 1 g by mouth daily.     fluticasone (FLONASE) 50 MCG/ACT nasal spray Place 2 sprays into both  nostrils daily. 15.8 mL 12   loratadine (CLARITIN) 10 MG tablet Take 10 mg by mouth daily.      metoprolol tartrate (LOPRESSOR) 25 MG tablet Take 0.5-1 tablets (12.5-25 mg total) by mouth 2 (two) times daily as needed (if pulse is persistently above 100). 180 tablet 3   Multiple Vitamin (MULTIVITAMIN) capsule Take 1 capsule by mouth daily.       simvastatin (ZOCOR) 40 MG tablet Take 1 tablet (40 mg total) by mouth every evening. 90 tablet 3   tiZANidine (ZANAFLEX) 2 MG tablet Take 0.5-1 tablets (1-2 mg total) by mouth every 8 (eight) hours as needed for muscle spasms. 30 tablet 1   traMADol (ULTRAM) 50 MG tablet Take 1 tablet (50 mg total) by mouth every 6 (six) hours as needed. 30 tablet 0   No facility-administered medications prior to visit.     Per HPI unless specifically indicated in ROS section below Review of Systems  Objective:  BP 114/64   Pulse 94   Temp 98.2 F (36.8 C) (Oral)   Ht 5\' 4"  (1.626 m)   Wt 140 lb 2 oz (63.6 kg)   SpO2 95%   BMI 24.05 kg/m   Wt Readings from Last 3 Encounters:  02/22/24 140 lb 2 oz (63.6  kg)  09/14/23 143 lb 9.6 oz (65.1 kg)  08/13/23 142 lb 2 oz (64.5 kg)      Physical Exam Vitals and nursing note reviewed.  Constitutional:      Appearance: Normal appearance. He is not ill-appearing.  HENT:     Head: Normocephalic and atraumatic.     Right Ear: Hearing, tympanic membrane, ear canal and external ear normal. There is no impacted cerumen.     Left Ear: Hearing, tympanic membrane, ear canal and external ear normal. There is no impacted cerumen.     Nose: Congestion (R>L) present. No mucosal edema or rhinorrhea.     Right Turbinates: Not enlarged or swollen.     Left Turbinates: Not enlarged or swollen.     Right Sinus: Maxillary sinus tenderness (mild) present. No frontal sinus tenderness.     Left Sinus: No maxillary sinus tenderness or frontal sinus tenderness.     Comments: Dry nasal mucosa    Mouth/Throat:     Mouth: Mucous  membranes are moist.     Pharynx: Oropharynx is clear. No oropharyngeal exudate or posterior oropharyngeal erythema.  Eyes:     Extraocular Movements: Extraocular movements intact.     Conjunctiva/sclera: Conjunctivae normal.     Pupils: Pupils are equal, round, and reactive to light.  Cardiovascular:     Rate and Rhythm: Normal rate and regular rhythm.     Pulses: Normal pulses.     Heart sounds: Normal heart sounds. No murmur heard. Pulmonary:     Effort: Pulmonary effort is normal. No respiratory distress.     Breath sounds: Normal breath sounds. No wheezing, rhonchi or rales.     Comments: Lungs clear Musculoskeletal:     Cervical back: Normal range of motion and neck supple. No rigidity.     Right lower leg: No edema.     Left lower leg: No edema.  Lymphadenopathy:     Cervical: No cervical adenopathy.  Skin:    General: Skin is warm and dry.     Findings: No rash.  Neurological:     Mental Status: He is alert.  Psychiatric:        Mood and Affect: Mood normal.        Behavior: Behavior normal.       Results for orders placed or performed in visit on 02/22/24  POCT Influenza A/B   Collection Time: 02/22/24  3:46 PM  Result Value Ref Range   Influenza A, POC Negative Negative   Influenza B, POC Negative Negative    Assessment & Plan:   Problem List Items Addressed This Visit     Acute sinusitis - Primary   Suspect R sided maxillary sinusitis after acute respiratory infection. However he notes significant improvement over last 2-3 days. Discussed possible viral sinusitis and anticipated course of resolution if the case.  WASP for augmentin 7d course printed with instructions when to fill. Discussed possible amox allergy in list - he confirms he is not allergic to amoxicillin but rather likely the Rifabutin component of the [place].  Update if not improving with treatment.       Relevant Medications   amoxicillin-clavulanate (AUGMENTIN) 875-125 MG tablet    Other Visit Diagnoses       Body aches       Relevant Orders   POCT Influenza A/B (Completed)        Meds ordered this encounter  Medications   amoxicillin-clavulanate (AUGMENTIN) 875-125 MG tablet    Sig: Take 1  tablet by mouth 2 (two) times daily for 7 days.    Dispense:  14 tablet    Refill:  0    Orders Placed This Encounter  Procedures   POCT Influenza A/B    Patient Instructions  You may have a sinus infection, possibly viral as improving over time. If not improving as expected, or any worsening symptoms, fever >101, one sided facial pain, fill augmentin antibiotic 7 day course - prescription printed out today  Push fluids and plenty of rest. Nasal saline irrigation or neti pot to help drain sinuses. May continue plain mucinex with plenty of fluid to help mobilize mucous. Please let us know if fever >101.5, trouble opening/closing mouth, difficulty swallowing, or worsening instead of improving as expected.   Follow up plan: Return if symptoms worsen or fail to improve.  Eustaquio Boyden, MD

## 2024-02-22 NOTE — Patient Instructions (Signed)
 You may have a sinus infection, possibly viral as improving over time. If not improving as expected, or any worsening symptoms, fever >101, one sided facial pain, fill augmentin antibiotic 7 day course - prescription printed out today  Push fluids and plenty of rest. Nasal saline irrigation or neti pot to help drain sinuses. May continue plain mucinex with plenty of fluid to help mobilize mucous. Please let us know if fever >101.5, trouble opening/closing mouth, difficulty swallowing, or worsening instead of improving as expected.

## 2024-02-26 DIAGNOSIS — G4733 Obstructive sleep apnea (adult) (pediatric): Secondary | ICD-10-CM | POA: Diagnosis not present

## 2024-03-28 DIAGNOSIS — G4733 Obstructive sleep apnea (adult) (pediatric): Secondary | ICD-10-CM | POA: Diagnosis not present

## 2024-04-27 DIAGNOSIS — G4733 Obstructive sleep apnea (adult) (pediatric): Secondary | ICD-10-CM | POA: Diagnosis not present

## 2024-05-27 DIAGNOSIS — G4733 Obstructive sleep apnea (adult) (pediatric): Secondary | ICD-10-CM | POA: Diagnosis not present

## 2024-06-27 DIAGNOSIS — G4733 Obstructive sleep apnea (adult) (pediatric): Secondary | ICD-10-CM | POA: Diagnosis not present

## 2024-07-20 ENCOUNTER — Ambulatory Visit: Payer: Self-pay

## 2024-07-20 DIAGNOSIS — R410 Disorientation, unspecified: Secondary | ICD-10-CM | POA: Diagnosis not present

## 2024-07-20 DIAGNOSIS — U071 COVID-19: Secondary | ICD-10-CM | POA: Diagnosis not present

## 2024-07-20 DIAGNOSIS — Z03818 Encounter for observation for suspected exposure to other biological agents ruled out: Secondary | ICD-10-CM | POA: Diagnosis not present

## 2024-07-20 NOTE — Telephone Encounter (Signed)
 Agree with UC.  I am not in clinic today.  Please get update on patient on Thursday.  Thanks.

## 2024-07-20 NOTE — Telephone Encounter (Signed)
 FYI Only or Action Required?: Action required by provider: request for appointment.  Patient was last seen in primary care on 02/22/2024 by Rilla Baller, MD.  Called Nurse Triage reporting Fever.  Symptoms began several days ago.  Interventions attempted: OTC medications: Tylenol , couple of Amoxicillin .  Symptoms are: gradually worsening.  Triage Disposition: See HCP Within 4 Hours (Or PCP Triage)  Patient/caregiver understands and will follow disposition?: Yes      Copied from CRM #[id number]. Topic: Clinical - Red Word Triage >> Jul 20, 2024 12:25 PM Donna BRAVO wrote: Red Word that prompted transfer to Nurse Triage: patient wife [place], stating patient is sick, fever last night 102.1 and this morning 100.1   congestion, coughing up yellow mucus, not moving around very will yesterday and today Reason for Disposition  [1] Fever > 101 F (38.3 C) AND [2] age > 60 years  Answer Assessment - Initial Assessment Questions Advised UC/ED, no available appts in clinic. Patient  reports wife will take him to [facility]-.  Patient reports not feeling well, fever, cough, congestion, body aches.  Denies HA, difficulty breathing, n/v/d.  1. TEMPERATURE: What is the most recent temperature?  How was it measured?      102 last taken 3 hours; Tylenol  500 mg, current temp 99.4 2. ONSET: When did the fever start?      Fever-Monday evening, cough/congestion- 3. CHILLS: Do you have chills? If yes: How bad are they?  (e.g., none, mild, moderate, severe)     Tuesday-ongoing 4. OTHER SYMPTOMS: Do you have any other symptoms besides the fever?  (e.g., abdomen pain, cough, diarrhea, earache, headache, sore throat, urination pain)     Currently cough, body aches. Patient denies HA, earaches, sore throat, urination pain, stiff neck 5. CAUSE: If there are no symptoms, ask: What do you think is causing the fever?      Could have got sick from someone, work outside alot 6.  CONTACTS: Does anyone else in the family have an infection?     No 7. TREATMENT: What have you done so far to treat this fever? (e.g., OTC fever medicines)     Took Tylenol , amoxicillin (not prescribed for these symptoms)  Able to keep food and drinks down 8. IMMUNOCOMPROMISE: Do you have any of the following: diabetes, HIV positive, splenectomy, cancer chemotherapy, chronic steroid treatment, transplant patient, etc.?     Kidney Cancer history in 1986, prostate cancer 5 years ago  10. TRAVEL: Have you traveled out of the country in the last month? (e.g., travel history, exposures)       no  Protocols used: [facility]

## 2024-07-21 NOTE — Telephone Encounter (Signed)
 Reached out to patient to see how he is doing today. He denies a fever today and he is  starting to feel much better. Yesterday he did go to [facility] and was diagnosis with covid.

## 2024-07-21 NOTE — Telephone Encounter (Signed)
Noted. Glad he is feeling better.

## 2024-07-25 ENCOUNTER — Ambulatory Visit (INDEPENDENT_AMBULATORY_CARE_PROVIDER_SITE_OTHER): Admitting: Family Medicine

## 2024-07-25 ENCOUNTER — Encounter: Payer: Self-pay | Admitting: Family Medicine

## 2024-07-25 VITALS — BP 136/74 | HR 73 | Temp 98.0°F | Ht 64.0 in | Wt 138.2 lb

## 2024-07-25 DIAGNOSIS — R7989 Other specified abnormal findings of blood chemistry: Secondary | ICD-10-CM | POA: Diagnosis not present

## 2024-07-25 DIAGNOSIS — R12 Heartburn: Secondary | ICD-10-CM | POA: Insufficient documentation

## 2024-07-25 DIAGNOSIS — U071 COVID-19: Secondary | ICD-10-CM | POA: Insufficient documentation

## 2024-07-25 LAB — BASIC METABOLIC PANEL WITH GFR
BUN: 37 mg/dL — ABNORMAL HIGH (ref 6–23)
CO2: 25 meq/L (ref 19–32)
Calcium: 9.2 mg/dL (ref 8.4–10.5)
Chloride: 103 meq/L (ref 96–112)
Creatinine, Ser: 1.66 mg/dL — ABNORMAL HIGH (ref 0.40–1.50)
GFR: 37.32 mL/min — ABNORMAL LOW (ref >60.00)
Glucose, Bld: 101 mg/dL — ABNORMAL HIGH (ref 70–99)
Potassium: 3.9 meq/L (ref 3.5–5.1)
Sodium: 139 meq/L (ref 135–145)

## 2024-07-25 MED ORDER — BENZONATATE 100 MG PO CAPS: 100.0000 mg | ORAL_CAPSULE | Freq: Three times a day (TID) | ORAL | 0 refills | Status: AC | PRN

## 2024-07-25 MED ORDER — OMEPRAZOLE 20 MG PO CPDR: 20.0000 mg | DELAYED_RELEASE_CAPSULE | Freq: Every day | ORAL | 3 refills | Status: AC

## 2024-07-25 NOTE — Assessment & Plan Note (Signed)
 Improving, okay for outpatient f/u.  No need for antivirals at this point.  D/w pt.  Labs today re: Cr.  Rest and fluids. Tessalon  for cough.  Update us  as needed.  He agrees.

## 2024-07-25 NOTE — Patient Instructions (Signed)
 Labs today.  We'll update you about that.   Rest and fluids. Tessalon  for cough.  Restart omeprazole .  Take care.  Glad to see you. Update us  as needed.

## 2024-07-25 NOTE — Assessment & Plan Note (Signed)
 Restart omeprazole .  Update us  as needed.

## 2024-07-25 NOTE — Progress Notes (Signed)
 Covid positive.  Positive 07/20/24.  First noted a raw feeling in the throat.  Then the next AM with inc sx.  Had a fever.  Cough.  Some head, some chest congestion.  Some better in the last few days. Less sputum today.  Prev with yellow sputum.  No fevers today or yesterday.  Tessalon  helped his cough.  Still on prednisone .    Needed refill on omeprazole , more burping and still having GERD/heartburn sx. Prev with relief from omeprazole . Was able to tolerate that med prior, allergy hx d/w pt.    Prev Cr elevation d/w pt.  Recheck pending.   Prev confusion with sx onset improved.    Meds, vitals, and allergies reviewed.   ROS: Per HPI unless specifically indicated in ROS section   GEN: nad, alert and oriented, normal mentation and speech.  HEENT: mucous membranes moist, TM wnl NECK: supple w/o LA CV: rrr.  PULM: ctab, no inc wob ABD: soft, +bs EXT: no edema SKIN: well perfused.  Scoliotic changes noted at baseline.

## 2024-07-27 ENCOUNTER — Ambulatory Visit: Payer: Self-pay | Admitting: Family Medicine

## 2024-07-27 DIAGNOSIS — G4733 Obstructive sleep apnea (adult) (pediatric): Secondary | ICD-10-CM | POA: Diagnosis not present

## 2024-08-16 ENCOUNTER — Ambulatory Visit (INDEPENDENT_AMBULATORY_CARE_PROVIDER_SITE_OTHER): Payer: HMO

## 2024-08-16 VITALS — Ht 64.0 in | Wt 138.0 lb

## 2024-08-16 DIAGNOSIS — Z0000 Encounter for general adult medical examination without abnormal findings: Secondary | ICD-10-CM

## 2024-08-16 NOTE — Progress Notes (Signed)
 Subjective:   Kenneth Ross is a 88 y.o. who presents for a Medicare Wellness preventive visit.  As a reminder, Annual Wellness Visits don't include a physical exam, and some assessments may be limited, especially if this visit is performed virtually. We may recommend an in-person follow-up visit with your provider if needed.  Visit Complete: Virtual I connected with  Kenneth Ross on 08/16/24 by a audio enabled telemedicine application and verified that I am speaking with the correct person using two identifiers.  Patient Location: Home  Provider Location: Office/Clinic  I discussed the limitations of evaluation and management by telemedicine. The patient expressed understanding and agreed to proceed.  Vital Signs: Because this visit was a virtual/telehealth visit, some criteria may be missing or patient reported. Any vitals not documented were not able to be obtained and vitals that have been documented are patient reported.  VideoDeclined- This patient declined Librarian, academic. Therefore the visit was completed with audio only.  Persons Participating in Visit: Patient.  AWV Questionnaire: No: Patient Medicare AWV questionnaire was not completed prior to this visit.  Cardiac Risk Factors include: advanced age (>62men, >45 women);dyslipidemia;male gender     Objective:    Today's Vitals   08/16/24 1455  Weight: 138 lb (62.6 kg)  Height: 5' 4 (1.626 m)   Body mass index is 23.69 kg/m.     08/16/2024    3:04 PM 08/06/2023    2:31 PM 07/22/2022   10:21 AM 05/16/2022    1:00 PM 05/16/2022    8:05 AM 11/11/2019    1:18 PM 10/21/2019    9:54 AM  Advanced Directives  Does Patient Have a Medical Advance Directive? Yes Yes Yes Yes Yes Yes Yes  Type of Estate agent of [place];Living will Healthcare Power of [place];Living will Living will Healthcare Power of [place];Living will Healthcare Power of [place];Living  will Healthcare Power of [place];Living will Healthcare Power of [place];Living will  Does patient want to make changes to medical advance directive?   Yes (MAU/Ambulatory/Procedural Areas - Information given) No - Patient declined   No - Patient declined  Copy of Healthcare Power of Attorney in Chart? No - copy requested No - copy requested    No - copy requested No - copy requested    Current Medications (verified) Outpatient Encounter Medications as of 08/16/2024  Medication Sig   aspirin  81 MG tablet Take 81 mg by mouth daily.     Azelastine  HCl 0.15 % SOLN Place 1 spray into the nose daily.   benzonatate  (TESSALON ) 100 MG capsule Take 1 capsule (100 mg total) by mouth 3 (three) times daily as needed for cough.   Calcium  500-125 MG-UNIT TABS Take 1 tablet by mouth daily.     calcium  carbonate (TUMS - DOSED IN MG ELEMENTAL CALCIUM ) 500 MG chewable tablet Chew 1 tablet by mouth daily as needed for indigestion or heartburn.   Cholecalciferol  (VITAMIN D ) 1000 UNITS capsule Take 1,000 Units by mouth daily.     diphenhydrAMINE  (BENADRYL ) 50 MG tablet Take 0.5 tablets (25 mg total) by mouth every 8 (eight) hours as needed for itching or allergies.   fish oil-omega-3 fatty acids  1000 MG capsule Take 1 g by mouth daily.   fluticasone  (FLONASE ) 50 MCG/ACT nasal spray Place 2 sprays into both nostrils daily.   loratadine (CLARITIN) 10 MG tablet Take 10 mg by mouth daily.    metoprolol  tartrate (LOPRESSOR ) 25 MG tablet Take 0.5-1 tablets (12.5-25 mg total)  by mouth 2 (two) times daily as needed (if pulse is persistently above 100).   Multiple Vitamin (MULTIVITAMIN) capsule Take 1 capsule by mouth daily.     omeprazole  (PRILOSEC) 20 MG capsule Take 1 capsule (20 mg total) by mouth daily.   simvastatin  (ZOCOR ) 40 MG tablet Take 1 tablet (40 mg total) by mouth every evening.   tiZANidine  (ZANAFLEX ) 2 MG tablet Take 0.5-1 tablets (1-2 mg total) by mouth every 8 (eight) hours as needed for muscle spasms.    traMADol  (ULTRAM ) 50 MG tablet Take 1 tablet (50 mg total) by mouth every 6 (six) hours as needed.   No facility-administered encounter medications on file as of 08/16/2024.    Allergies (verified) Ibuprofen, Iodinated contrast media, and Talicia  [amoxicill-rifabutin-omeprazole ]   History: Past Medical History:  Diagnosis Date   Allergic rhinitis, seasonal    Allergy    Bell's palsy    with L eyelid droop at baseline as of 2017   Blood transfusion without reported diagnosis    Cancer of kidney (HCC)    Cataract    CKD (chronic kidney disease), stage III (HCC)    followed by pcp   ED (erectile dysfunction)    GERD (gastroesophageal reflux disease)    occasional take tums   History of renal cell carcinoma    1986  s/p  left nephrectomy   Hyperlipidemia    Idiopathic scoliosis    Pneumonia    Prostate cancer [facility]) urologist-- dr dahlstedt/  oncologist-- dr patrcia   dx 09-07-2019--- Stage T1c,  Gleason 4+3   Seasonal allergies    Sleep apnea    Cpap   Solitary right kidney    s/p  left nephrecotmy 1986   Tachycardia followed by pcp   HR 100 @ pcp office --- pt referred to cardiology for consult w/ dr t. perla note in epic 12-14-2017, by pcp (dr cleatus) no work up done,  pt has lopressor  as needed if heart rate is elevated as pt feels appropiate   Past Surgical History:  Procedure Laterality Date   CARPAL TUNNEL RELEASE Left 02/27/2023   CATARACT EXTRACTION Left 06/10/2018   Dr. Waylan   CATARACT EXTRACTION Right 2021   CATARACT EXTRACTION W/ INTRAOCULAR LENS IMPLANT Left    LAMINECTOMY  1993   L3-5   NEPHRECTOMY Left 10/1985   RADIOACTIVE SEED IMPLANT N/A 10/21/2019   Procedure: RADIOACTIVE SEED IMPLANT/BRACHYTHERAPY IMPLANT;  Surgeon: Matilda Senior, MD;  Location: [facility] [place];  Service: Urology;  Laterality: N/A;  65 seeds implanted   SHOULDER OPEN ROTATOR CUFF REPAIR  12/1998   Right   SHOULDER OPEN ROTATOR CUFF REPAIR Bilateral 08/2001    SPACE OAR INSTILLATION N/A 10/21/2019   Procedure: SPACE OAR INSTILLATION;  Surgeon: Matilda Senior, MD;  Location: [facility];  Service: Urology;  Laterality: N/A;   Family History  Problem Relation Age of Onset   Cancer Mother        breast   Stroke Mother        mets stroke  56   Fibromyalgia Mother    Arthritis Mother    Gallbladder disease Mother    Hypertension Mother    Heart disease Father    Cancer Father        lung and prostate metastases   Stroke Father        mini strokes   Prostate cancer Father    Gallbladder disease Father    Hypertension Father    Liver cancer Sister  Lupus Sister    Hepatitis C Sister    Diabetes Sister    GER disease Sister    Esophageal cancer Paternal Aunt    Cancer Maternal Grandmother        type unknown   Gallbladder disease Paternal Grandmother    Stroke Paternal Grandfather    COPD Daughter    Bronchitis Daughter    Liver disease Daughter    Colon cancer Cousin    Rectal cancer Cousin    Bladder Cancer Cousin    Colon polyps Neg Hx    Stomach cancer Neg Hx    Social History   Socioeconomic History   Marital status: Married    Spouse name: Not on file   Number of children: 2   Years of education: Not on file   Highest education level: Not on file  Occupational History   Occupation: Retired  Tobacco Use   Smoking status: Never   Smokeless tobacco: Never  Vaping Use   Vaping status: Never Used  Substance and Sexual Activity   Alcohol use: No    Alcohol/week: 0.0 standard drinks of alcohol   Drug use: Never   Sexual activity: Yes  Other Topics Concern   Not on file  Social History Narrative   Retired from [employer], Curator   Marital Status: Married Aug 31, 1961, widowed Aug 31, 2017.  Wife died from hodgkin's lymphoma.  She also had h/o colon cancer.     Remarried 05/25/2019.     Children: 2 daughters initially- one was disabled and lived with pt until she died in Aug 31, 2013   [employer]58-'[employer],  deployed to [place] .   Social Drivers of Corporate investment banker Strain: Low Risk  (08/16/2024)   Overall Financial Resource Strain (CARDIA)    Difficulty of Paying Living Expenses: Not hard at all  Food Insecurity: No Food Insecurity (08/16/2024)   Hunger Vital Sign    Worried About Running Out of Food in the Last Year: Never true    Ran Out of Food in the Last Year: Never true  Transportation Needs: No Transportation Needs (08/16/2024)   PRAPARE - Administrator, Civil Service (Medical): No    Lack of Transportation (Non-Medical): No  Physical Activity: Sufficiently Active (08/16/2024)   Exercise Vital Sign    Days of Exercise per Week: 7 days    Minutes of Exercise per Session: 30 min  Stress: No Stress Concern Present (08/16/2024)   [employer] of Occupational Health - Occupational Stress Questionnaire    Feeling of Stress: Not at all  Social Connections: Moderately Integrated (08/16/2024)   Social Connection and Isolation Panel    Frequency of Communication with Friends and Family: More than three times a week    Frequency of Social Gatherings with Friends and Family: Once a week    Attends Religious Services: More than 4 times per year    Active Member of [employer] or Organizations: No    Attends Engineer, structural: Never    Marital Status: Married    Tobacco Counseling Counseling given: Not Answered    Clinical Intake:  Pre-visit preparation completed: Yes  Pain : No/denies pain     BMI - recorded: 23.69 Nutritional Status: BMI of 19-24  Normal Nutritional Risks: None Diabetes: No  Lab Results  Component Value Date   HGBA1C 5.8 08/06/2007     How often do you need to have someone help you when you read instructions, pamphlets, or other written materials from your  doctor or pharmacy?: 1 - Never  Interpreter Needed?: No  Comments: lives with wife Information entered by :: B.Sharlyn Odonnel,LPN   Activities of Daily Living      08/16/2024    3:04 PM  In your present state of health, do you have any difficulty performing the following activities:  Hearing? 0  Vision? 0  Difficulty concentrating or making decisions? 0  Walking or climbing stairs? 0  Dressing or bathing? 0  Doing errands, shopping? 0  Preparing Food and eating ? N  Using the Toilet? N  In the past six months, have you accidently leaked urine? N  Do you have problems with loss of bowel control? N  Managing your Medications? N  Managing your Finances? N  Housekeeping or managing your Housekeeping? N    Patient Care Team: Cleatus Arlyss RAMAN, MD as PCP - General (Family Medicine) Grayce Buddle, RN Nurse Navigator as Registered Nurse (Medical Oncology) Waylan Cain, MD as Consulting Physician (Ophthalmology)  I have updated your Care Teams any recent Medical Services you may have received from other providers in the past year.     Assessment:   This is a routine wellness examination for Kenneth Ross.  Hearing/Vision screen Hearing Screening - Comments:: Patient denies any hearing difficulties.   Vision Screening - Comments:: Pt says their vision is good with glasses Dr  Waylan   Goals Addressed             This Visit's Progress    Patient Stated   On track    08/16/24- I will continue to take medications as prescribed.      Patient Stated   On track    08/16/24- stay healthy       Depression Screen     08/16/2024    3:01 PM 09/14/2023   10:45 AM 08/13/2023   10:25 AM 08/06/2023    2:33 PM 09/11/2022    9:32 AM 09/09/2021    9:22 AM 09/06/2019    9:03 AM  PHQ 2/9 Scores  PHQ - 2 Score 0 0 0 0 0 0 0  PHQ- 9 Score  0  0  0     Fall Risk     08/16/2024    2:58 PM 09/14/2023   10:45 AM 08/13/2023   10:24 AM 08/06/2023    2:32 PM 12/12/2022    8:38 AM  Fall Risk   Falls in the past year? 0 0 1 0 0  Number falls in past yr: 0 0 0 0 0  Injury with Fall? 0 0 1 0 0  Risk for fall due to : No Fall Risks No Fall Risks  Medication side  effect History of fall(s)  Follow up Education provided;Falls prevention discussed Falls evaluation completed  Falls prevention discussed;Falls evaluation completed Falls evaluation completed      Data saved with a previous flowsheet row definition    MEDICARE RISK AT HOME:  Medicare Risk at Home Any stairs in or around the home?: Yes (3 stairs at back door) If so, are there any without handrails?: Yes Home free of loose throw rugs in walkways, pet beds, electrical cords, etc?: Yes Adequate lighting in your home to reduce risk of falls?: Yes Life alert?: No Use of a cane, walker or w/c?: No Grab bars in the bathroom?: No Shower chair or bench in shower?: Yes Elevated toilet seat or a handicapped toilet?: Yes  TIMED UP AND GO:  Was the test performed?  No  Cognitive Function: 6CIT  completed    08/30/2018    3:54 PM  MMSE - Mini Mental State Exam  Orientation to time 5  Orientation to Place 5  Registration 3  Attention/ Calculation 0  Recall 3  Language- name 2 objects 0  Language- repeat 1  Language- follow 3 step command 3  Language- read & follow direction 0  Write a sentence 0  Copy design 0  Total score 20        08/16/2024    3:05 PM 08/06/2023    2:33 PM  6CIT Screen  What Year? 0 points 0 points  What month? 0 points 0 points  What time? 0 points 0 points  Count back from 20 0 points 0 points  Months in reverse 0 points 0 points  Repeat phrase 0 points 0 points  Total Score 0 points 0 points    Immunizations Immunization History  Administered Date(s) Administered   Fluad Quad(high Dose 65+) 09/06/2020, 09/09/2021, 09/11/2022   Fluad Trivalent(High Dose 65+) 09/14/2023   INFLUENZA, HIGH DOSE SEASONAL PF 09/15/2017, 08/30/2018, 08/07/2019   Influenza Split 09/04/2011   Influenza Whole 11/13/2006, 09/10/2007, 09/11/2008, 08/20/2010   Influenza,inj,Quad PF,6+ Mos 08/02/2014, 08/08/2015, 08/11/2016   PFIZER(Purple Top)SARS-COV-2 Vaccination 12/26/2019,  01/16/2020, 10/02/2020, 08/26/2021   Pneumococcal Conjugate-13 08/08/2015   Pneumococcal Polysaccharide-23 08/02/2004   Respiratory Syncytial Virus Vaccine,Recomb Aduvanted(Arexvy) 10/22/2023   Td 05/01/1996, 12/01/2005, 05/01/2017   Zoster Recombinant(Shingrix) 09/15/2017, 12/16/2017   Zoster, Live 07/13/2013    Screening Tests Health Maintenance  Topic Date Due   Influenza Vaccine  07/01/2024   COVID-19 Vaccine (5 - 2025-26 season) 08/01/2024   Medicare Annual Wellness (AWV)  08/16/2025   DTaP/Tdap/Td (4 - Tdap) 05/02/2027   Pneumococcal Vaccine: 50+ Years  Completed   Zoster Vaccines- Shingrix  Completed   HPV VACCINES  Aged Out   Meningococcal B Vaccine  Aged Out    Health Maintenance Items Addressed: None due at this time. Pt says he will receive vaccines at PCP appt next month  Additional Screening:  Vision Screening: Recommended annual ophthalmology exams for early detection of glaucoma and other disorders of the eye. Is the patient up to date with their annual eye exam?  Yes  Who is the provider or what is the name of the office in which the patient attends annual eye exams? Dr KATHEE Haddock  Dental Screening: Recommended annual dental exams for proper oral hygiene  Community Resource Referral / Chronic Care Management: CRR required this visit?  No   CCM required this visit?  No   Plan:    I have personally reviewed and noted the following in the patient's chart:   Medical and social history Use of alcohol, tobacco or illicit drugs  Current medications and supplements including opioid prescriptions. Patient is currently taking opioid prescriptions. Information provided to patient regarding non-opioid alternatives. Patient advised to discuss non-opioid treatment plan with their provider. Functional ability and status Nutritional status Physical activity Advanced directives List of other physicians Hospitalizations, surgeries, and ER visits in previous 12  months Vitals Screenings to include cognitive, depression, and falls Referrals and appointments  In addition, I have reviewed and discussed with patient certain preventive protocols, quality metrics, and best practice recommendations. A written personalized care plan for preventive services as well as general preventive health recommendations were provided to patient.   Kenneth LITTIE Saris, LPN   [id number]   After Visit Summary: (Declined) Due to this being a telephonic visit, with patients personalized plan was offered to patient  but patient Declined AVS at this time   Notes: Please refer to Routing Comments.

## 2024-08-16 NOTE — Patient Instructions (Signed)
 Kenneth Ross,  Thank you for taking the time for your Medicare Wellness Visit. I appreciate your continued commitment to your health goals. Please review the care plan we discussed, and feel free to reach out if I can assist you further.  Medicare recommends these wellness visits once per year to help you and your care team stay ahead of potential health issues. These visits are designed to focus on prevention, allowing your provider to concentrate on managing your acute and chronic conditions during your regular appointments.  Please note that Annual Wellness Visits do not include a physical exam. Some assessments may be limited, especially if the visit was conducted virtually. If needed, we may recommend a separate in-person follow-up with your provider.  Ongoing Care Seeing your primary care provider every 3 to 6 months helps us  monitor your health and provide consistent, personalized care.   Referrals If a referral was made during today's visit and you haven't received any updates within two weeks, please contact the referred provider directly to check on the status.  Recommended Screenings:  Health Maintenance  Topic Date Due   Flu Shot  07/01/2024   COVID-19 Vaccine (5 - 2025-26 season) 08/01/2024   Medicare Annual Wellness Visit  08/16/2025   DTaP/Tdap/Td vaccine (4 - Tdap) 05/02/2027   Pneumococcal Vaccine for age over 34  Completed   Zoster (Shingles) Vaccine  Completed   HPV Vaccine  Aged Out   Meningitis B Vaccine  Aged Out       08/06/2023    2:31 PM  Advanced Directives  Does Patient Have a Medical Advance Directive? Yes  Type of Estate agent of [place];Living will  Copy of Healthcare Power of Attorney in Chart? No - copy requested   Advance Care Planning is important because it: Ensures you receive medical care that aligns with your values, goals, and preferences. Provides guidance to your family and loved ones, reducing the emotional burden  of decision-making during critical moments.  Vision: Annual vision screenings are recommended for early detection of glaucoma, cataracts, and diabetic retinopathy. These exams can also reveal signs of chronic conditions such as diabetes and high blood pressure.  Dental: Annual dental screenings help detect early signs of oral cancer, gum disease, and other conditions linked to overall health, including heart disease and diabetes.

## 2024-08-26 DIAGNOSIS — G4733 Obstructive sleep apnea (adult) (pediatric): Secondary | ICD-10-CM | POA: Diagnosis not present

## 2024-08-29 ENCOUNTER — Other Ambulatory Visit: Payer: Self-pay | Admitting: Family Medicine

## 2024-09-04 ENCOUNTER — Other Ambulatory Visit: Payer: Self-pay | Admitting: Family Medicine

## 2024-09-04 DIAGNOSIS — E7800 Pure hypercholesterolemia, unspecified: Secondary | ICD-10-CM

## 2024-09-08 ENCOUNTER — Other Ambulatory Visit (INDEPENDENT_AMBULATORY_CARE_PROVIDER_SITE_OTHER): Payer: HMO

## 2024-09-08 DIAGNOSIS — E7800 Pure hypercholesterolemia, unspecified: Secondary | ICD-10-CM

## 2024-09-08 LAB — LIPID PANEL
Cholesterol: 163 mg/dL (ref 0–200)
HDL: 35.3 mg/dL — ABNORMAL LOW (ref >39.00)
LDL Cholesterol: 92 mg/dL (ref 0–99)
NonHDL: 128.12
Total CHOL/HDL Ratio: 5
Triglycerides: 183 mg/dL — ABNORMAL HIGH (ref 0.0–149.0)
VLDL: 36.6 mg/dL (ref 0.0–40.0)

## 2024-09-08 LAB — CBC WITH DIFFERENTIAL/PLATELET
Basophils Absolute: 0 K/uL (ref 0.0–0.1)
Basophils Relative: 0.4 % (ref 0.0–3.0)
Eosinophils Absolute: 0.2 K/uL (ref 0.0–0.7)
Eosinophils Relative: 4.2 % (ref 0.0–5.0)
HCT: 39.5 % (ref 39.0–52.0)
Hemoglobin: 13 g/dL (ref 13.0–17.0)
Lymphocytes Relative: 30.1 % (ref 12.0–46.0)
Lymphs Abs: 1.5 K/uL (ref 0.7–4.0)
MCHC: 33 g/dL (ref 30.0–36.0)
MCV: 96.3 fl (ref 78.0–100.0)
Monocytes Absolute: 0.7 K/uL (ref 0.1–1.0)
Monocytes Relative: 13.5 % — ABNORMAL HIGH (ref 3.0–12.0)
Neutro Abs: 2.5 K/uL (ref 1.4–7.7)
Neutrophils Relative %: 51.8 % (ref 43.0–77.0)
Platelets: 196 K/uL (ref 150.0–400.0)
RBC: 4.11 Mil/uL — ABNORMAL LOW (ref 4.22–5.81)
RDW: 14.6 % (ref 11.5–15.5)
WBC: 4.9 K/uL (ref 4.0–10.5)

## 2024-09-08 LAB — BASIC METABOLIC PANEL WITH GFR
BUN: 26 mg/dL — ABNORMAL HIGH (ref 6–23)
CO2: 29 meq/L (ref 19–32)
Calcium: 9.4 mg/dL (ref 8.4–10.5)
Chloride: 107 meq/L (ref 96–112)
Creatinine, Ser: 1.75 mg/dL — ABNORMAL HIGH (ref 0.40–1.50)
GFR: 34.99 mL/min — ABNORMAL LOW (ref >60.00)
Glucose, Bld: 85 mg/dL (ref 70–99)
Potassium: 4.2 meq/L (ref 3.5–5.1)
Sodium: 143 meq/L (ref 135–145)

## 2024-09-11 ENCOUNTER — Ambulatory Visit: Payer: Self-pay | Admitting: Family Medicine

## 2024-09-12 ENCOUNTER — Encounter: Payer: Self-pay | Admitting: Surgical

## 2024-09-12 ENCOUNTER — Ambulatory Visit: Admitting: Surgical

## 2024-09-12 DIAGNOSIS — G5601 Carpal tunnel syndrome, right upper limb: Secondary | ICD-10-CM | POA: Diagnosis not present

## 2024-09-12 NOTE — Progress Notes (Signed)
 Follow-up Office Visit Note   Patient: Kenneth Ross           Date of Birth: 1936-08-23           MRN: [id number] Visit Date: 09/12/2024 Requested by: Cleatus Arlyss RAMAN, MD [address] [place],  [place] [address] PCP: Cleatus Arlyss RAMAN, MD  Subjective: Chief Complaint  Patient presents with   Right Wrist - Pain    HPI: Kenneth Ross is a 88 y.o. male who returns to the office for follow-up visit.    Plan at last visit was: (From 07/15/2023) : also recommended that he return if he would like to have right carpal tunnel release.  Has a nerve conduction study from 02/11/2023 demonstrating severe bilateral median nerve entrapment at the wrist.  Currently he is not ready for right carpal tunnel release but he will keep us  posted and he can call when he would like to schedule that.    Since then, patient notes his right hand is continuing to cause pain and numbness in the median nerve distribution.  It is bothering him enough and waking him up at night to the point where he would like to consider carpal tunnel release.  He is right-hand dominant.  Left hand is doing well from prior left carpal tunnel release on 02/27/2023.              ROS: All systems reviewed are negative as they relate to the chief complaint within the history of present illness.  Patient denies fevers or chills.  Assessment & Plan: Visit Diagnoses:  1. Carpal tunnel syndrome, right upper limb     Plan: Kenneth Ross is a 88 y.o. male who returns to the office for follow-up visit.  Plan from last visit was noted above in HPI.  They now return with right carpal tunnel syndrome symptoms with numbness and tingling and pain in his thumb, index, long fingers.  He would like to have carpal tunnel release like he had on his left hand on 02/27/2023.  Currently his left hand is doing very well and he has no symptoms.  He does have severe carpal tunnel syndrome on the right side from prior nerve study.  He understands  that he may have some residual symptoms and it may take several months to see full relief from carpal tunnel release based on the severity of his carpal tunnel syndrome.  He did have lingering symptoms for several months in the left hand.  Plan to post him for right carpal tunnel release in December 2025.  Follow-up after procedure.  Follow-Up Instructions: No follow-ups on file.   Orders:  No orders of the defined types were placed in this encounter.  No orders of the defined types were placed in this encounter.     Procedures: No procedures performed   Clinical Data: No additional findings.  Objective: Vital Signs: There were no vitals taken for this visit.  Physical Exam:  Constitutional: Patient appears well-developed HEENT:  Head: Normocephalic Eyes:EOM are normal Neck: Normal range of motion Cardiovascular: Normal rate Pulmonary/chest: Effort normal Neurologic: Patient is alert Skin: Skin is warm Psychiatric: Patient has normal mood and affect  Ortho Exam: Ortho exam demonstrates some interosseous atrophy and atrophy of the thenar musculature of the right hand.  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  Patient does have intact EPL, FPL, finger  abduction, grip strength testing of the right hand.  Positive Durkan sign over the carpal tunnel but negative Tinel sign.  Palpable radial pulse of the right upper extremity.  No abnormality noted to the skin over the site of where the carpal tunnel release incision would be.  Specialty Comments:  No specialty comments available.  Imaging: No results found.   PMFS History: Patient Active Problem List   Diagnosis Date Noted   COVID 07/25/2024   Heartburn 07/25/2024   Acute sinusitis 02/22/2024   Leg pain 08/16/2023   Right shoulder pain 12/14/2022   Sleep apnea    Rash    H. 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   FOOT PAIN, RIGHT 02/18/2008   Renal cell cancer, left (HCC) 07/22/2007   Past Medical History:  Diagnosis Date   Allergic rhinitis, seasonal    Allergy    Bell's palsy    with L eyelid droop at baseline as of 2017   Blood transfusion without reported diagnosis    Cancer of kidney (HCC)    Cataract    CKD (chronic kidney disease), stage III (HCC)    followed by pcp   ED (erectile dysfunction)    GERD (gastroesophageal reflux disease)    occasional take tums   History of renal cell carcinoma    1986  s/p  left nephrectomy   Hyperlipidemia    Idiopathic scoliosis    Pneumonia    Prostate cancer [facility]) urologist-- dr dahlstedt/  oncologist-- dr patrcia   dx 09-07-2019--- Stage T1c,  Gleason 4+3   Seasonal allergies    Sleep apnea    Cpap   Solitary right kidney    s/p  left nephrecotmy 1986   Tachycardia followed by pcp   HR 100 @ pcp office --- pt referred to cardiology for consult w/ dr t. perla note in epic 12-14-2017, by pcp (dr cleatus) no work up done,  pt has lopressor  as needed if heart rate is elevated as pt feels appropiate    Family History  Problem Relation Age of Onset   Cancer Mother        breast   Stroke Mother        mets stroke  66   Fibromyalgia Mother    Arthritis Mother    Gallbladder disease Mother    Hypertension Mother    Heart disease Father    Cancer Father        lung and prostate metastases   Stroke Father        mini strokes   Prostate cancer Father    Gallbladder disease Father    Hypertension Father    Liver  cancer Sister    Lupus Sister    Hepatitis C Sister    Diabetes Sister    GER disease Sister    Esophageal cancer Paternal Aunt    Cancer Maternal Grandmother        type unknown   Gallbladder disease Paternal Grandmother    Stroke Paternal Grandfather    COPD Daughter    Bronchitis Daughter    Liver disease Daughter  Colon cancer Cousin    Rectal cancer Cousin    Bladder Cancer Cousin    Colon polyps Neg Hx    Stomach cancer Neg Hx     Past Surgical History:  Procedure Laterality Date   CARPAL TUNNEL RELEASE Left 02/27/2023   CATARACT EXTRACTION Left 06/10/2018   Dr. Waylan   CATARACT EXTRACTION Right 2021   CATARACT EXTRACTION W/ INTRAOCULAR LENS IMPLANT Left    LAMINECTOMY  1993   L3-5   NEPHRECTOMY Left 10/1985   RADIOACTIVE SEED IMPLANT N/A 10/21/2019   Procedure: RADIOACTIVE SEED IMPLANT/BRACHYTHERAPY IMPLANT;  Surgeon: Matilda Senior, MD;  Location: [facility];  Service: Urology;  Laterality: N/A;  65 seeds implanted   SHOULDER OPEN ROTATOR CUFF REPAIR  12/1998   Right   SHOULDER OPEN ROTATOR CUFF REPAIR Bilateral 08/2001   SPACE OAR INSTILLATION N/A 10/21/2019   Procedure: SPACE OAR INSTILLATION;  Surgeon: Matilda Senior, MD;  Location: [facility];  Service: Urology;  Laterality: N/A;   Social History   Occupational History   Occupation: Retired  Tobacco Use   Smoking status: Never   Smokeless tobacco: Never  Vaping Use   Vaping status: Never Used  Substance and Sexual Activity   Alcohol use: No    Alcohol/week: 0.0 standard drinks of alcohol   Drug use: Never   Sexual activity: Yes

## 2024-09-15 ENCOUNTER — Encounter: Payer: Self-pay | Admitting: Family Medicine

## 2024-09-15 ENCOUNTER — Ambulatory Visit: Payer: HMO | Admitting: Family Medicine

## 2024-09-15 VITALS — BP 132/70 | HR 80 | Temp 98.1°F | Ht 64.25 in | Wt 141.6 lb

## 2024-09-15 DIAGNOSIS — L989 Disorder of the skin and subcutaneous tissue, unspecified: Secondary | ICD-10-CM

## 2024-09-15 DIAGNOSIS — Z7189 Other specified counseling: Secondary | ICD-10-CM

## 2024-09-15 DIAGNOSIS — G5600 Carpal tunnel syndrome, unspecified upper limb: Secondary | ICD-10-CM

## 2024-09-15 DIAGNOSIS — Z23 Encounter for immunization: Secondary | ICD-10-CM | POA: Diagnosis not present

## 2024-09-15 DIAGNOSIS — R7989 Other specified abnormal findings of blood chemistry: Secondary | ICD-10-CM

## 2024-09-15 DIAGNOSIS — Z0000 Encounter for general adult medical examination without abnormal findings: Secondary | ICD-10-CM

## 2024-09-15 DIAGNOSIS — M4120 Other idiopathic scoliosis, site unspecified: Secondary | ICD-10-CM | POA: Diagnosis not present

## 2024-09-15 MED ORDER — SIMVASTATIN 40 MG PO TABS: 40.0000 mg | ORAL_TABLET | Freq: Every evening | ORAL | 3 refills | Status: AC

## 2024-09-15 NOTE — Progress Notes (Signed)
 D/w pt about Cr and prev trend.  Stable ~1.6-1.7 recently.  He avoids nsaids, d/w pt.   history of nephrectomy noted.  Discussed.  Occ sore sensation anterior to the R ear w/o clear changes on the skin today.  See exam.  No recent metoprolol  use or needed.    Elevated Cholesterol: Using medications without problems: yes Muscle aches: not likely from statin.  Diet compliance: d/w pt.  Exercise: d/w pt.    H/o scoliosis at baseline with rare use of tizanidine .    He has carpal tunnel surgery pending.   Flu shot today Shingles done.   PNA up to date Tetanus 2018 RSV prev done.  Covid d/w pt.   Colonoscopy 2023 Prostate cancer screening- per urology.   Advance directive- wife, daughter, and grandson Ethel all equally designated if patient were incapacitated.   Meds, vitals, and allergies reviewed.   ROS: Per HPI unless specifically indicated in ROS section   Nad Ncat Neck supple, no LA Rrr Ctab Abd soft, not ttp Chronic scoliotic changes noted in the back. Skin well-perfused. Scaly lesion on the top of the R ear- possible AK.   I do not appreciate any lesion anterior to the right ear where he had noticed occasional soreness.

## 2024-09-15 NOTE — Patient Instructions (Signed)
 Flu shot today.  Update me as needed.  Take care.  Glad to see you. Let me know if you can't get set up with dermatology.

## 2024-09-18 DIAGNOSIS — R7989 Other specified abnormal findings of blood chemistry: Secondary | ICD-10-CM | POA: Insufficient documentation

## 2024-09-18 DIAGNOSIS — L989 Disorder of the skin and subcutaneous tissue, unspecified: Secondary | ICD-10-CM | POA: Insufficient documentation

## 2024-09-18 NOTE — Assessment & Plan Note (Signed)
 With procedure pending, per outside clinic.

## 2024-09-18 NOTE — Assessment & Plan Note (Signed)
 Continue as needed tizanidine  at baseline.

## 2024-09-18 NOTE — Assessment & Plan Note (Signed)
 D/w pt about Cr and prev trend.  Stable ~1.6-1.7 recently.  He avoids nsaids, d/w pt.   history of nephrectomy noted.  Discussed.

## 2024-09-18 NOTE — Assessment & Plan Note (Signed)
 Reasonable to see dermatology, given the location of the lesion on the ear.  I do not see any lesion to explain his symptoms anterior to the right ear.  I asked him to let me know if he cannot get set up with dermatology.

## 2024-09-18 NOTE — Assessment & Plan Note (Signed)
 Flu shot today Shingles done.   PNA up to date Tetanus 2018 RSV prev done.  Covid d/w pt.   Colonoscopy 2023 Prostate cancer screening- per urology.   Advance directive- wife, daughter, and grandson Ethel all equally designated if patient were incapacitated.

## 2024-09-18 NOTE — Assessment & Plan Note (Signed)
Advance directive- wife, daughter, and grandson Nicola Girt all equally designated if patient were incapacitated.

## 2024-09-19 ENCOUNTER — Ambulatory Visit

## 2024-09-19 DIAGNOSIS — L820 Inflamed seborrheic keratosis: Secondary | ICD-10-CM

## 2024-09-19 DIAGNOSIS — D492 Neoplasm of unspecified behavior of bone, soft tissue, and skin: Secondary | ICD-10-CM

## 2024-09-19 DIAGNOSIS — Z1283 Encounter for screening for malignant neoplasm of skin: Secondary | ICD-10-CM | POA: Diagnosis not present

## 2024-09-19 DIAGNOSIS — W908XXA Exposure to other nonionizing radiation, initial encounter: Secondary | ICD-10-CM | POA: Diagnosis not present

## 2024-09-19 DIAGNOSIS — L570 Actinic keratosis: Secondary | ICD-10-CM

## 2024-09-19 DIAGNOSIS — L821 Other seborrheic keratosis: Secondary | ICD-10-CM | POA: Diagnosis not present

## 2024-09-19 DIAGNOSIS — L578 Other skin changes due to chronic exposure to nonionizing radiation: Secondary | ICD-10-CM

## 2024-09-19 DIAGNOSIS — L814 Other melanin hyperpigmentation: Secondary | ICD-10-CM

## 2024-09-19 NOTE — Patient Instructions (Addendum)

## 2024-09-19 NOTE — Progress Notes (Signed)
 Subjective   Kenneth Ross is a 88 y.o. male who presents for the following: Lesion(s) of concern . Patient is new patient  Today patient reports: Lesions of concern at forehead, right ear, and scalp. No personal hx of skin cancer, hx skin cancer- father.   Review of Systems:    No other skin or systemic complaints except as noted in HPI or Assessment and Plan.  The following portions of the chart were reviewed this encounter and updated as appropriate: medications, allergies, medical history  Relevant Medical History:  Family history of skin cancer - father   Objective  Well appearing patient in no apparent distress; mood and affect are within normal limits. Examination was performed of the: Sun Exposed Exam: Scalp, head, eyes, ears, nose, lips, neck, upper extremities, hands, fingers, fingernails  Examination notable for: Lentigo/lentigines: Scattered pigmented macules that are tan to brown in color and are somewhat non-uniform in shape and concentrated in the sun-exposed areas, Seborrheic Keratosis(es): Stuck-on appearing keratotic papule(s) on the trunk, some  irritated with redness, crusting, edema, and/or partial avulsion, Actinic Damage/Elastosis: chronic sun damage: dyspigmentation, telangiectasia, and wrinkling, Actinic keratosis: Scaly erythematous macule(s) concentrated on sun exposed areas   Examination limited by: Undergarments, Shoes or socks , Clothing, and Patient deferred removal     R sup helix 1 cm pink scaly plaque  Face x5 (5) Pink scaly macules R chest x1, R hand x1 (2) Stuck on waxy paps with erythema  Assessment & Plan    Benign Lesions/ Findings:  Seborrheic Keratosis  Lentigines  - Reassurance provided regarding the benign appearance of lesions noted on exam today; no treatment is indicated in the absence of symptoms/changes. - Reinforced importance of photoprotective strategies including liberal and frequent sunscreen use of a broad-spectrum SPF  30 or greater, use of protective clothing, and sun avoidance for prevention of cutaneous malignancy and photoaging.  Counseled patient on the importance of regular self-skin monitoring as well as routine clinical skin examinations as scheduled.   ACTINIC DAMAGE - Chronic condition, secondary to cumulative UV/sun exposure - Recommend daily broad spectrum sunscreen SPF 30+ to sun-exposed areas, reapply every 2 hours as needed.  - Staying in the shade or wearing long sleeves, sun glasses (UVA+UVB protection) and wide brim hats (4-inch brim around the entire circumference of the hat) are also recommended for sun protection.  - Call for new or changing lesions.  Level of service outlined above   Procedures, orders, diagnosis for this visit:  NEOPLASM OF SKIN R sup helix Skin / nail biopsy Type of biopsy: tangential   Informed consent: discussed and consent obtained   Timeout: patient name, date of birth, surgical site, and procedure verified   Procedure prep:  Patient was prepped and draped in usual sterile fashion Prep type:  Isopropyl alcohol Anesthesia: the lesion was anesthetized in a standard fashion   Anesthetic:  1% lidocaine  w/ epinephrine 1-100,000 buffered w/ 8.4% NaHCO3 Instrument used: DermaBlade   Hemostasis achieved with: pressure and aluminum chloride   Outcome: patient tolerated procedure well   Post-procedure details: sterile dressing applied and wound care instructions given   Dressing type: bandage and petrolatum     Specimen 1 - Surgical pathology Differential Diagnosis: AK vs SCC vs BCC   Check Margins: No 1 cm pink scaly plaque AK (ACTINIC KERATOSIS) (5) Face x5 (5) Actinic keratoses are precancerous spots that appear secondary to cumulative UV radiation exposure/sun exposure over time. They are chronic with expected duration over 1 year.  A portion of actinic keratoses will progress to squamous cell carcinoma of the skin. It is not possible to reliably predict which  spots will progress to skin cancer and so treatment is recommended to prevent development of skin cancer.  Recommend daily broad spectrum sunscreen SPF 30+ to sun-exposed areas, reapply every 2 hours as needed.  Recommend staying in the shade or wearing long sleeves, sun glasses (UVA+UVB protection) and wide brim hats (4-inch brim around the entire circumference of the hat). Call for new or changing lesions. Destruction of lesion - Face x5 (5) Complexity: simple   Destruction method: cryotherapy   Informed consent: discussed and consent obtained   Timeout:  patient name, date of birth, surgical site, and procedure verified Lesion destroyed using liquid nitrogen: Yes   Region frozen until ice ball extended beyond lesion: Yes   Cryo cycles: 1 or 2. Outcome: patient tolerated procedure well with no complications   Post-procedure details: wound care instructions given   Additional details:  Prior to procedure, discussed risks of blister formation, small wound, skin dyspigmentation, or rare scar following cryotherapy. Recommend Vaseline ointment to treated areas while healing.   INFLAMED SEBORRHEIC KERATOSIS (2) R chest x1, R hand x1 (2) Symptomatic, irritating, patient would like treated.  Neoplasm of skin -     Skin / nail biopsy -     Surgical pathology; Standing  AK (actinic keratosis) -     Destruction of lesion  Inflamed seborrheic keratosis    Return to clinic: Return if symptoms worsen or fail to improve, for pending bx results.  I, Jacquelynn V. Wilfred, CMA, am acting as scribe for Lauraine JAYSON Kanaris, MD .   Documentation: I have reviewed the above documentation for accuracy and completeness, and I agree with the above.  Lauraine JAYSON Kanaris, MD

## 2024-09-22 ENCOUNTER — Ambulatory Visit: Payer: Self-pay

## 2024-09-22 LAB — SURGICAL PATHOLOGY

## 2024-09-22 NOTE — Progress Notes (Signed)
NA / NVM 

## 2024-09-28 ENCOUNTER — Other Ambulatory Visit: Payer: Self-pay

## 2024-09-28 MED ORDER — FLUOROURACIL 5 % EX CREA: TOPICAL_CREAM | CUTANEOUS | 0 refills | Status: AC

## 2024-09-28 NOTE — Progress Notes (Signed)
 Patient informed and voiced good understanding. Efudex sent to pharmacy on file and appointment scheduled.

## 2024-09-28 NOTE — Progress Notes (Signed)
 Efudex sent to pharmacy on file per Dr. Raymund biopsy note. Patient instructed of proper use.

## 2024-10-20 DIAGNOSIS — Z961 Presence of intraocular lens: Secondary | ICD-10-CM | POA: Diagnosis not present

## 2024-10-20 DIAGNOSIS — H26491 Other secondary cataract, right eye: Secondary | ICD-10-CM | POA: Diagnosis not present

## 2024-10-20 DIAGNOSIS — H524 Presbyopia: Secondary | ICD-10-CM | POA: Diagnosis not present

## 2024-10-31 ENCOUNTER — Other Ambulatory Visit: Payer: Self-pay | Admitting: Surgical

## 2024-10-31 DIAGNOSIS — G5601 Carpal tunnel syndrome, right upper limb: Secondary | ICD-10-CM | POA: Diagnosis not present

## 2024-10-31 MED ORDER — TRAMADOL HCL 50 MG PO TABS: 50.0000 mg | ORAL_TABLET | Freq: Four times a day (QID) | ORAL | 0 refills | Status: AC | PRN

## 2024-11-14 ENCOUNTER — Encounter: Payer: Self-pay | Admitting: Surgical

## 2024-11-14 ENCOUNTER — Ambulatory Visit: Admitting: Surgical

## 2024-11-14 DIAGNOSIS — G5601 Carpal tunnel syndrome, right upper limb: Secondary | ICD-10-CM

## 2024-11-14 NOTE — Progress Notes (Signed)
 Post-Op Visit Note   Patient: Kenneth Ross           Date of Birth: January 04, 1936           MRN: [id number] Visit Date: 11/14/2024 PCP: Cleatus Arlyss RAMAN, MD   Assessment & Plan:  Chief Complaint:  Chief Complaint  Patient presents with   Right Wrist - Routine Post Op    10/31/2024 Right CTR   Visit Diagnoses: No diagnosis found.  Plan: Kenneth Ross is a 88 y.o. male who presents s/p right carpal tunnel release on 10/31/2024.  Patient has been compliant with weightbearing restrictions and not lift anything with the operative arm.  Pain is overall controlled.  Numbness and tingling is improved compared with preoperatively; has virtually no numbness or tingling since surgery and this has completely resolved..  Denies any fevers, chills, night sweats, drainage from the surgical site.  Taking Tylenol  for pain control.  On exam, incision is healing well with sutures intact.  Sutures removed and replaced with Steri-Strips today.  2+ radial pulse of the operative extremity.  No sign of infection or dehiscence of the incision.  Intact EPL, FPL, finger abduction, wrist extension, finger adduction.  Plan is to hold off on any lifting more than about a pound with the operative hand.  No submersion underwater such as an a sink, bathtub, pool, hot tub.  Okay for showering.  Plan for return in about 4 weeks for recheck on incision and once the incision heals, may increase lifting.  Follow-Up Instructions: No follow-ups on file.   Orders:  No orders of the defined types were placed in this encounter.  No orders of the defined types were placed in this encounter.   Imaging: No results found.  PMFS History: Patient Active Problem List   Diagnosis Date Noted   Elevated serum creatinine 09/18/2024   Skin lesion 09/18/2024   Heartburn 07/25/2024   Leg pain 08/16/2023   Right shoulder pain 12/14/2022   Sleep apnea    Rash    H. 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   FOOT PAIN, RIGHT 02/18/2008   Renal cell cancer, left (HCC) 07/22/2007   Past Medical History:  Diagnosis Date   Actinic keratoses    R superior helix treated w/Efudex  09/28/24   Allergic rhinitis, seasonal    Allergy    Bell's palsy    with L eyelid droop at baseline as of 2017   Blood transfusion without reported diagnosis    Cancer of kidney (HCC)    Cataract    CKD (chronic kidney disease), stage III (HCC)    followed by pcp   ED (erectile dysfunction)    GERD (gastroesophageal reflux disease)    occasional take tums   History of renal cell carcinoma    1986  s/p  left nephrectomy   Hyperlipidemia    Idiopathic scoliosis    Pneumonia    Prostate cancer [facility]) urologist-- dr dahlstedt/  oncologist-- dr patrcia   dx 09-07-2019--- Stage T1c,  Gleason 4+3   Seasonal allergies    Sleep apnea  Cpap   Solitary right kidney    s/p  left nephrecotmy 1986   Tachycardia followed by pcp   HR 100 @ pcp office --- pt referred to cardiology for consult w/ dr t. perla note in epic 12-14-2017, by pcp (dr cleatus) no work up done,  pt has lopressor  as needed if heart rate is elevated as pt feels appropiate    Family History  Problem Relation Age of Onset   Cancer Mother        breast   Stroke Mother        mets stroke  25   Fibromyalgia Mother    Arthritis Mother    Gallbladder disease Mother    Hypertension Mother    Heart disease Father    Cancer Father        lung and prostate metastases   Stroke Father        mini strokes   Prostate cancer Father     Gallbladder disease Father    Hypertension Father    Liver cancer Sister    Lupus Sister    Hepatitis C Sister    Diabetes Sister    GER disease Sister    Esophageal cancer Paternal Aunt    Cancer Maternal Grandmother        type unknown   Gallbladder disease Paternal Grandmother    Stroke Paternal Grandfather    COPD Daughter    Bronchitis Daughter    Liver disease Daughter    Colon cancer Cousin    Rectal cancer Cousin    Bladder Cancer Cousin    Colon polyps Neg Hx    Stomach cancer Neg Hx     Past Surgical History:  Procedure Laterality Date   CARPAL TUNNEL RELEASE Left 02/27/2023   CATARACT EXTRACTION Left 06/10/2018   Dr. Waylan   CATARACT EXTRACTION Right 2021   CATARACT EXTRACTION W/ INTRAOCULAR LENS IMPLANT Left    LAMINECTOMY  1993   L3-5   NEPHRECTOMY Left 10/1985   RADIOACTIVE SEED IMPLANT N/A 10/21/2019   Procedure: RADIOACTIVE SEED IMPLANT/BRACHYTHERAPY IMPLANT;  Surgeon: Matilda Senior, MD;  Location: [facility] [place];  Service: Urology;  Laterality: N/A;  65 seeds implanted   SHOULDER OPEN ROTATOR CUFF REPAIR  12/1998   Right   SHOULDER OPEN ROTATOR CUFF REPAIR Bilateral 08/2001   SPACE OAR INSTILLATION N/A 10/21/2019   Procedure: SPACE OAR INSTILLATION;  Surgeon: Matilda Senior, MD;  Location: [facility];  Service: Urology;  Laterality: N/A;   Social History   Occupational History   Occupation: Retired  Tobacco Use   Smoking status: Never   Smokeless tobacco: Never  Vaping Use   Vaping status: Never Used  Substance and Sexual Activity   Alcohol use: No    Alcohol/week: 0.0 standard drinks of alcohol   Drug use: Never   Sexual activity: Yes

## 2024-12-09 ENCOUNTER — Other Ambulatory Visit: Payer: Self-pay | Admitting: Family Medicine

## 2024-12-15 ENCOUNTER — Ambulatory Visit: Admitting: Surgical

## 2024-12-15 ENCOUNTER — Encounter: Payer: Self-pay | Admitting: Surgical

## 2024-12-15 DIAGNOSIS — G5601 Carpal tunnel syndrome, right upper limb: Secondary | ICD-10-CM

## 2024-12-15 NOTE — Progress Notes (Signed)
 "  Post-Op Visit Note   Patient: Kenneth Ross           Date of Birth: 1935/12/11           MRN: [id number] Visit Date: 12/15/2024 PCP: Cleatus Arlyss RAMAN, MD   Assessment & Plan:  Chief Complaint:  Chief Complaint  Patient presents with   Right Wrist - Routine Post Op    10/31/2024 Right CTR   Visit Diagnoses:  1. Carpal tunnel syndrome, right upper limb     Plan: Patient is an 89 year old male who presents s/p right carpal tunnel release on 10/31/2024.  Doing well.  Has minimal tenderness.  He has returned to doing full activities.  Only takes occasional Tylenol .  No numbness or tingling.  On exam, incision has healed without evidence of infection or dehiscence.  2+ radial pulse of the operative extremity.  Intact EPL, FPL, finger abduction.  Excellent grip strength rated 5/5.  Plan at this time is continue with activity as tolerated.  Call with any concerns.  Follow-up with the office as needed.  Follow-Up Instructions: No follow-ups on file.   Orders:  No orders of the defined types were placed in this encounter.  No orders of the defined types were placed in this encounter.   Imaging: No results found.  PMFS History: Patient Active Problem List   Diagnosis Date Noted   Elevated serum creatinine 09/18/2024   Skin lesion 09/18/2024   Heartburn 07/25/2024   Leg pain 08/16/2023   Right shoulder pain 12/14/2022   Sleep apnea    Rash    H. 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   FOOT PAIN, RIGHT 02/18/2008   Renal cell cancer, left  (HCC) 07/22/2007   Past Medical History:  Diagnosis Date   Actinic keratoses    R superior helix treated w/Efudex  09/28/24   Allergic rhinitis, seasonal    Allergy    Bell's palsy    with L eyelid droop at baseline as of 2017   Blood transfusion without reported diagnosis    Cancer of kidney (HCC)    Cataract    CKD (chronic kidney disease), stage III (HCC)    followed by pcp   ED (erectile dysfunction)    GERD (gastroesophageal reflux disease)    occasional take tums   History of renal cell carcinoma    1986  s/p  left nephrectomy   Hyperlipidemia    Idiopathic scoliosis    Pneumonia    Prostate cancer [facility]) urologist-- dr dahlstedt/  oncologist-- dr patrcia   dx 09-07-2019--- Stage T1c,  Gleason 4+3   Seasonal allergies    Sleep apnea    Cpap   Solitary right kidney    s/p  left nephrecotmy 1986   Tachycardia followed by pcp   HR 100 @ pcp office --- pt referred to cardiology for consult w/ dr t. perla note in epic 12-14-2017, by pcp (dr cleatus) no work up done,  pt has lopressor  as needed if heart rate is elevated as pt feels appropiate    Family History  Problem Relation Age of Onset  Cancer Mother        breast   Stroke Mother        mets stroke  65   Fibromyalgia Mother    Arthritis Mother    Gallbladder disease Mother    Hypertension Mother    Heart disease Father    Cancer Father        lung and prostate metastases   Stroke Father        mini strokes   Prostate cancer Father    Gallbladder disease Father    Hypertension Father    Liver cancer Sister    Lupus Sister    Hepatitis C Sister    Diabetes Sister    GER disease Sister    Esophageal cancer Paternal Aunt    Cancer Maternal Grandmother        type unknown   Gallbladder disease Paternal Grandmother    Stroke Paternal Grandfather    COPD Daughter    Bronchitis Daughter    Liver disease Daughter    Colon cancer Cousin    Rectal cancer Cousin    Bladder Cancer Cousin    Colon polyps Neg  Hx    Stomach cancer Neg Hx     Past Surgical History:  Procedure Laterality Date   CARPAL TUNNEL RELEASE Left 02/27/2023   CATARACT EXTRACTION Left 06/10/2018   Dr. Waylan   CATARACT EXTRACTION Right 2021   CATARACT EXTRACTION W/ INTRAOCULAR LENS IMPLANT Left    LAMINECTOMY  1993   L3-5   NEPHRECTOMY Left 10/1985   RADIOACTIVE SEED IMPLANT N/A 10/21/2019   Procedure: RADIOACTIVE SEED IMPLANT/BRACHYTHERAPY IMPLANT;  Surgeon: Matilda Senior, MD;  Location: [facility];  Service: Urology;  Laterality: N/A;  65 seeds implanted   SHOULDER OPEN ROTATOR CUFF REPAIR  12/1998   Right   SHOULDER OPEN ROTATOR CUFF REPAIR Bilateral 08/2001   SPACE OAR INSTILLATION N/A 10/21/2019   Procedure: SPACE OAR INSTILLATION;  Surgeon: Matilda Senior, MD;  Location: [facility];  Service: Urology;  Laterality: N/A;   Social History   Occupational History   Occupation: Retired  Tobacco Use   Smoking status: Never   Smokeless tobacco: Never  Vaping Use   Vaping status: Never Used  Substance and Sexual Activity   Alcohol use: No    Alcohol/week: 0.0 standard drinks of alcohol   Drug use: Never   Sexual activity: Yes     "

## 2024-12-29 ENCOUNTER — Ambulatory Visit

## 2024-12-29 DIAGNOSIS — L814 Other melanin hyperpigmentation: Secondary | ICD-10-CM | POA: Diagnosis not present

## 2024-12-29 DIAGNOSIS — L821 Other seborrheic keratosis: Secondary | ICD-10-CM

## 2024-12-29 DIAGNOSIS — W908XXA Exposure to other nonionizing radiation, initial encounter: Secondary | ICD-10-CM

## 2024-12-29 DIAGNOSIS — L570 Actinic keratosis: Secondary | ICD-10-CM

## 2024-12-29 DIAGNOSIS — D2239 Melanocytic nevi of other parts of face: Secondary | ICD-10-CM | POA: Diagnosis not present

## 2024-12-29 DIAGNOSIS — L578 Other skin changes due to chronic exposure to nonionizing radiation: Secondary | ICD-10-CM | POA: Diagnosis not present

## 2024-12-29 DIAGNOSIS — Z872 Personal history of diseases of the skin and subcutaneous tissue: Secondary | ICD-10-CM

## 2024-12-29 NOTE — Progress Notes (Signed)
 "   Subjective   Kenneth Ross is a 89 y.o. male who presents for the following: Follow up of bx proven AK of the R sup helix. Patient is established patient   Today patient reports: R sup helix has cleared since using 5FU x 3 weeks.  Patient concerned about lesion on the face  Review of Systems:    No other skin or systemic complaints except as noted in HPI or Assessment and Plan.  The following portions of the chart were reviewed this encounter and updated as appropriate: medications, allergies, medical history  Relevant Medical History:  Personal history of actinic keratosis   Objective  (SKPE) Well appearing patient in no apparent distress; mood and affect are within normal limits. Examination was performed of the: Focused Exam of: the face and ears   Examination notable for: Lentigo/lentigines: Scattered pigmented macules that are tan to brown in color and are somewhat non-uniform in shape and concentrated in the sun-exposed areas, Seborrheic Keratosis(es): Stuck-on appearing keratotic papule(s) on the trunk, none  irritated with redness, crusting, edema, and/or partial avulsion, Actinic Damage/Elastosis: chronic sun damage: dyspigmentation, telangiectasia, and wrinkling, Actinic keratosis: Scaly erythematous macule(s) concentrated on sun exposed areas   - 1-2 mm smooth symmetric flesh colored to pink papule(s) without features suspicious for malignancy on dermoscopy -- L nasal bridge  Examination limited by: n/a  R cheek x 1 Pink scaly macules  Assessment & Plan  (SKAP)   Angiofibroma/Fibrous Papule - L nasal bridge - Benign-appearing.  Observation.  Call clinic for new or changing lesions.   HISTORY OF PRECANCEROUS ACTINIC KERATOSIS - R sup helix, bx proven -  clear today. - these may recur and new lesions may form requiring treatment to prevent transformation into skin cancer - observe for new or changing spots and contact [facility] for appointment if  occur - photoprotection with sun protective clothing; sunglasses and broad spectrum sunscreen with SPF of at least 30 + and frequent self skin exams recommended - yearly exams by a dermatologist recommended for persons with history of PreCancerous Actinic Keratoses  BENIGN SKIN FINDINGS  - Lentigines  - Seborrheic keratoses - Reassurance provided regarding the benign appearance of lesions noted on exam today; no treatment is indicated in the absence of symptoms/changes. - Reinforced importance of photoprotective strategies including liberal and frequent sunscreen use of a broad-spectrum SPF 30 or greater, use of protective clothing, and sun avoidance for prevention of cutaneous malignancy and photoaging.  Counseled patient on the importance of regular self-skin monitoring as well as routine clinical skin examinations as scheduled.   ACTINIC DAMAGE - Chronic condition, secondary to cumulative UV/sun exposure - Recommend daily broad spectrum sunscreen SPF 30+ to sun-exposed areas, reapply every 2 hours as needed.  - Staying in the shade or wearing long sleeves, sun glasses (UVA+UVB protection) and wide brim hats (4-inch brim around the entire circumference of the hat) are also recommended for sun protection.  - Call for new or changing lesions.   Was sun protection counseling provided?: Yes   Procedures, orders, diagnosis for this visit:  AK (ACTINIC KERATOSIS) R cheek x 1 Actinic keratoses are precancerous spots that appear secondary to cumulative UV radiation exposure/sun exposure over time. They are chronic with expected duration over 1 year. A portion of actinic keratoses will progress to squamous cell carcinoma of the skin. It is not possible to reliably predict which spots will progress to skin cancer and so treatment is recommended to prevent development of skin  cancer.  Recommend daily broad spectrum sunscreen SPF 30+ to sun-exposed areas, reapply every 2 hours as needed.  Recommend  staying in the shade or wearing long sleeves, sun glasses (UVA+UVB protection) and wide brim hats (4-inch brim around the entire circumference of the hat). Call for new or changing lesions. - Destruction of lesion - R cheek x 1 Complexity: simple   Destruction method: cryotherapy   Informed consent: discussed and consent obtained   Timeout:  patient name, date of birth, surgical site, and procedure verified Lesion destroyed using liquid nitrogen: Yes   Region frozen until ice ball extended beyond lesion: Yes   Cryo cycles: 1 or 2. Outcome: patient tolerated procedure well with no complications   Post-procedure details: wound care instructions given     AK (actinic keratosis) -     Destruction of lesion   Level of service outlined above   Return to clinic: Return in about 6 months (around 06/28/2025) for TBSE.  Kenneth Ross, CMA, am acting as scribe for Lauraine JAYSON Kanaris, MD .   Documentation: I have reviewed the above documentation for accuracy and completeness, and I agree with the above.  Lauraine JAYSON Kanaris, MD  "

## 2024-12-29 NOTE — Patient Instructions (Signed)

## 2025-06-28 ENCOUNTER — Ambulatory Visit

## 2025-08-17 ENCOUNTER — Ambulatory Visit

## 2025-08-18 ENCOUNTER — Ambulatory Visit

## 2025-09-11 ENCOUNTER — Other Ambulatory Visit

## 2025-09-18 ENCOUNTER — Encounter: Admitting: Family Medicine
